# Patient Record
Sex: Male | Born: 1960 | Race: White
Health system: Southern US, Community
[De-identification: ages and names within clinical notes are randomized; demographics above are authoritative.]

## PROBLEM LIST (undated history)

## (undated) DIAGNOSIS — I10 Essential (primary) hypertension: Secondary | ICD-10-CM

## (undated) HISTORY — DX: Essential (primary) hypertension: I10

---

## 2013-06-29 ENCOUNTER — Ambulatory Visit: Payer: Self-pay | Admitting: Physician Assistant

## 2014-08-30 IMAGING — CR DG FOOT COMPLETE 3+V*L*
1 series · 3 of 3 positions shown · non-contrast
Comparison: none

REASON FOR EXAM: pain, swelling
COMMENTS:

[Series 1: ap · 0.17mm/px · 3 of 3 slices shown]
[im 1/3]
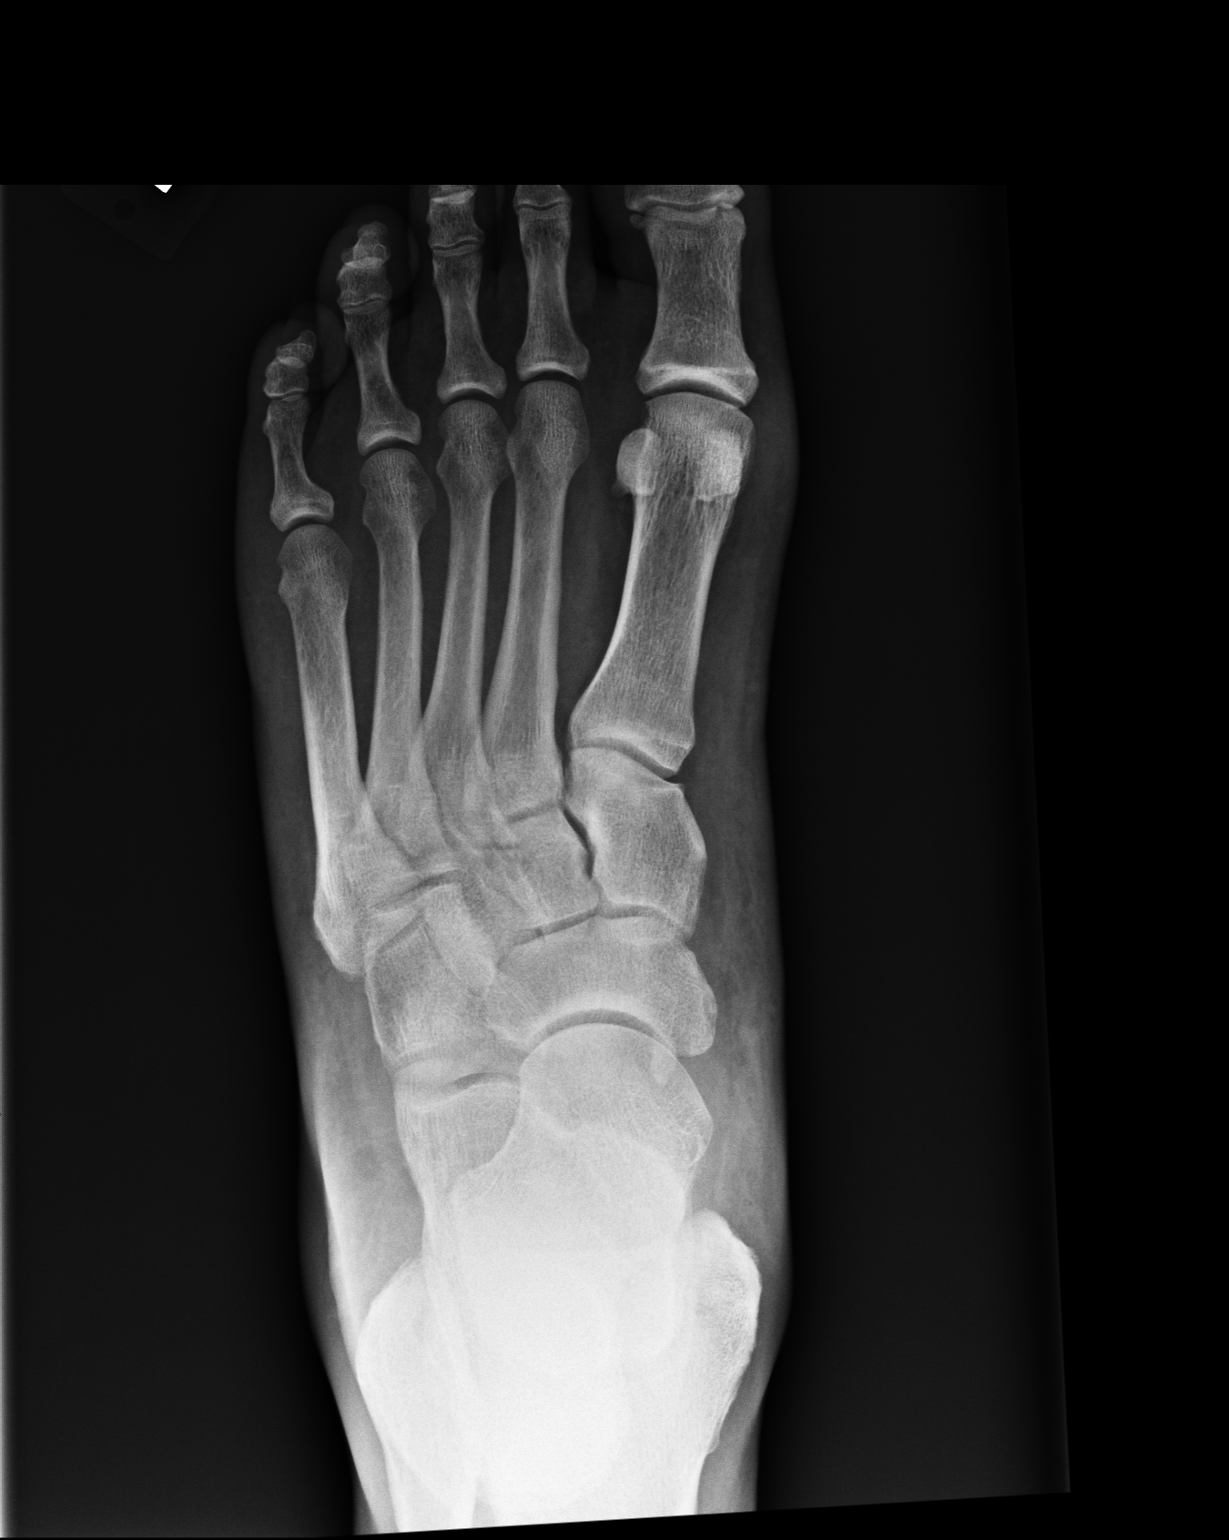
[im 2/3]
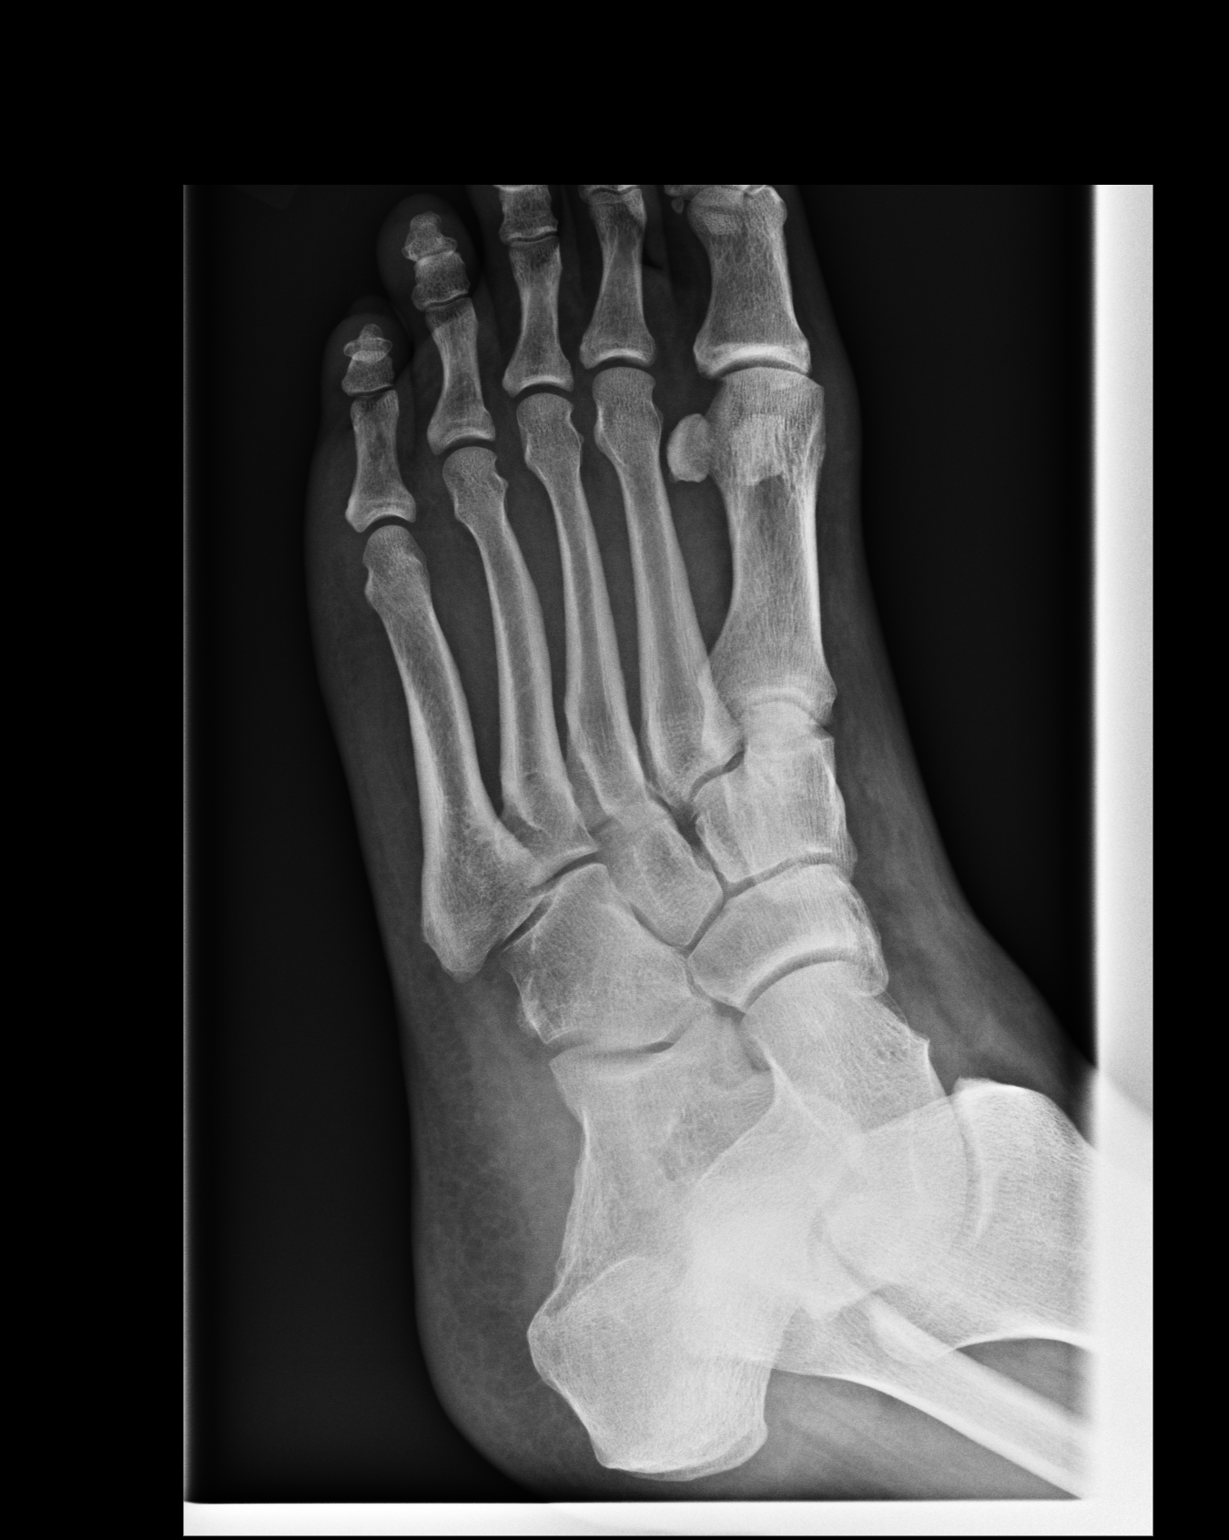
[im 3/3]
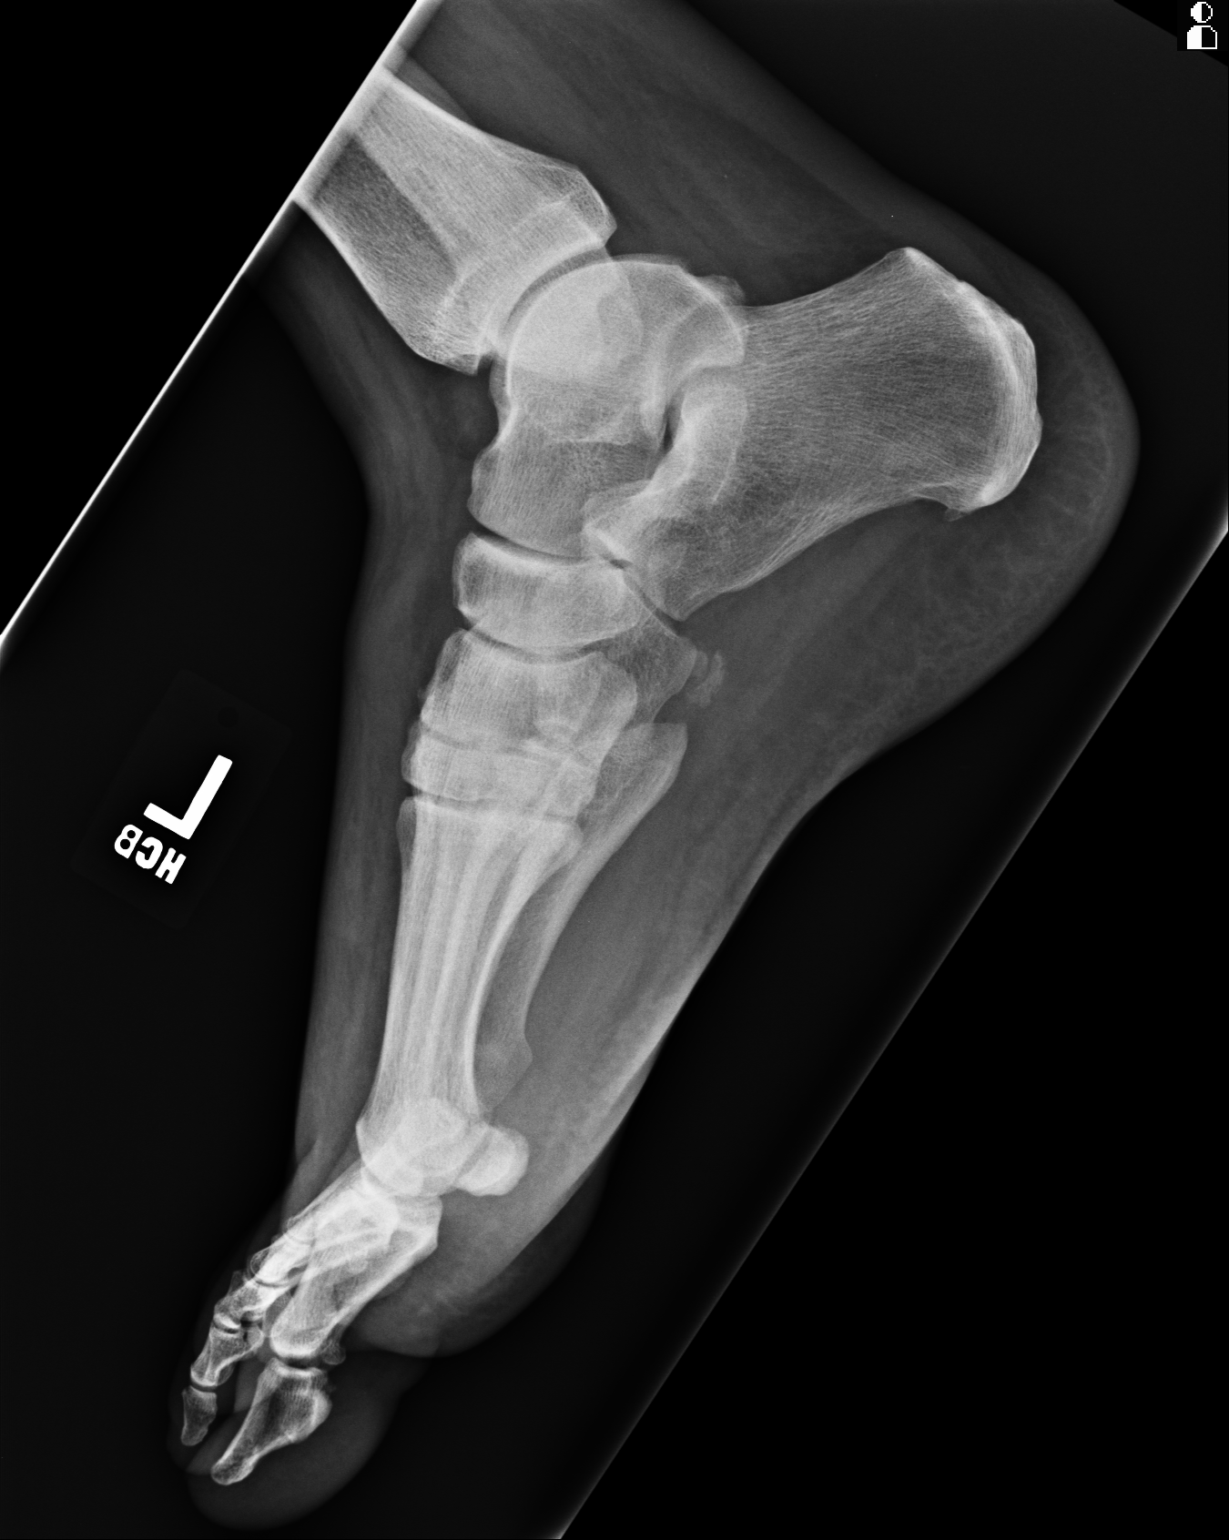

[3 of 3 positions shown; findings below may reference images not displayed]

PROCEDURE:     MDR - MDR FOOT LT COMP W/OBLQUES  - June 29, 2013  [DATE]

RESULT:     Three views of the left foot reveal the bones to be adequately
mineralized. There is no evidence of an acute fracture. There is bony
density adjacent to the tarsal cuboid on the lateral film that is likely
degenerative but could be posttraumatic in the appropriate clinical setting.
The metatarsal bases are intact.
IMPRESSION: There is no definite acute bony abnormality of the left
foot. There is mild soft tissue swelling over the dorsum of the midfoot.
Followup CT scanning is available if there are clinical concerns of the
fracture following any recent trauma.

[REDACTED]

## 2020-09-30 ENCOUNTER — Inpatient Hospital Stay
Admission: EM | Admit: 2020-09-30 | Discharge: 2020-10-02 | DRG: 885 | Disposition: A | Discharge status: Other Acute Inpatient Hospital | Payer: Self-pay | Attending: Internal Medicine | Admitting: Internal Medicine

## 2020-09-30 DIAGNOSIS — R45851 Suicidal ideations: Secondary | ICD-10-CM | POA: Diagnosis present

## 2020-09-30 DIAGNOSIS — E876 Hypokalemia: Secondary | ICD-10-CM | POA: Diagnosis present

## 2020-09-30 DIAGNOSIS — F10239 Alcohol dependence with withdrawal, unspecified: Secondary | ICD-10-CM | POA: Diagnosis present

## 2020-09-30 DIAGNOSIS — Z20822 Contact with and (suspected) exposure to covid-19: Secondary | ICD-10-CM | POA: Diagnosis present

## 2020-09-30 DIAGNOSIS — G250 Essential tremor: Secondary | ICD-10-CM | POA: Diagnosis present

## 2020-09-30 DIAGNOSIS — E872 Acidosis: Secondary | ICD-10-CM | POA: Diagnosis present

## 2020-09-30 DIAGNOSIS — N179 Acute kidney failure, unspecified: Secondary | ICD-10-CM | POA: Diagnosis present

## 2020-09-30 DIAGNOSIS — F10231 Alcohol dependence with withdrawal delirium: Secondary | ICD-10-CM | POA: Diagnosis present

## 2020-09-30 DIAGNOSIS — I10 Essential (primary) hypertension: Secondary | ICD-10-CM | POA: Diagnosis present

## 2020-09-30 DIAGNOSIS — Z008 Encounter for other general examination: Secondary | ICD-10-CM

## 2020-09-30 DIAGNOSIS — F23 Brief psychotic disorder: Principal | ICD-10-CM | POA: Diagnosis present

## 2020-09-30 DIAGNOSIS — F10939 Alcohol use, unspecified with withdrawal, unspecified: Secondary | ICD-10-CM | POA: Diagnosis present

## 2020-09-30 DIAGNOSIS — Y901 Blood alcohol level of 20-39 mg/100 ml: Secondary | ICD-10-CM | POA: Diagnosis present

## 2020-09-30 LAB — RAPID DRUG SCREEN, URINE
Barbiturate Screen, UR: NEGATIVE
Barbiturate Screen, UR: POSITIVE — AB
Benzodiazepine Screen, UR: NEGATIVE
Benzodiazepine Screen, UR: NEGATIVE
Cannabinoid Screen, UR: NEGATIVE
Cannabinoid Screen, UR: NEGATIVE
Cocaine, UR: NEGATIVE
Cocaine, UR: NEGATIVE
Opiate Screen, UR: NEGATIVE
Opiate Screen, UR: NEGATIVE
PCP Screen, UR: NEGATIVE
PCP Screen, UR: NEGATIVE
Urine Amphetamine Screen: NEGATIVE
Urine Amphetamine Screen: NEGATIVE

## 2020-09-30 LAB — URINALYSIS REFLEX TO MICROSCOPIC EXAM - REFLEX TO CULTURE
Bilirubin, UA: NEGATIVE
Glucose, UA: 50 — AB
Ketones UA: NEGATIVE
Leukocyte Esterase, UA: NEGATIVE
Nitrite, UA: NEGATIVE
Protein, UR: 30 — AB
Specific Gravity UA: 1.014 (ref 1.001–1.035)
Urine pH: 6 (ref 5.0–8.0)
Urobilinogen, UA: NORMAL mg/dL (ref 0.2–2.0)

## 2020-09-30 LAB — CBC AND DIFFERENTIAL
Absolute NRBC: 0 10*3/uL (ref 0.00–0.00)
Basophils Absolute Automated: 0.04 10*3/uL (ref 0.00–0.08)
Basophils Automated: 0.5 %
Eosinophils Absolute Automated: 0.01 10*3/uL (ref 0.00–0.44)
Eosinophils Automated: 0.1 %
Hematocrit: 41.2 % (ref 37.6–49.6)
Hgb: 14.1 g/dL (ref 12.5–17.1)
Immature Granulocytes Absolute: 0.04 10*3/uL (ref 0.00–0.07)
Immature Granulocytes: 0.5 %
Lymphocytes Absolute Automated: 0.81 10*3/uL (ref 0.42–3.22)
Lymphocytes Automated: 10.7 %
MCH: 33.7 pg — ABNORMAL HIGH (ref 25.1–33.5)
MCHC: 34.2 g/dL (ref 31.5–35.8)
MCV: 98.6 fL — ABNORMAL HIGH (ref 78.0–96.0)
MPV: 9.3 fL (ref 8.9–12.5)
Monocytes Absolute Automated: 1.17 10*3/uL — ABNORMAL HIGH (ref 0.21–0.85)
Monocytes: 15.4 %
Neutrophils Absolute: 5.53 10*3/uL (ref 1.10–6.33)
Neutrophils: 72.8 %
Nucleated RBC: 0 /100 WBC (ref 0.0–0.0)
Platelets: 246 10*3/uL (ref 142–346)
RBC: 4.18 10*6/uL — ABNORMAL LOW (ref 4.20–5.90)
RDW: 12 % (ref 11–15)
WBC: 7.6 10*3/uL (ref 3.10–9.50)

## 2020-09-30 LAB — BASIC METABOLIC PANEL
Anion Gap: 14 (ref 5.0–15.0)
BUN: 34 mg/dL — ABNORMAL HIGH (ref 9.0–28.0)
CO2: 17 mEq/L — ABNORMAL LOW (ref 22–29)
Calcium: 8 mg/dL — ABNORMAL LOW (ref 8.5–10.5)
Chloride: 105 mEq/L (ref 100–111)
Creatinine: 1.4 mg/dL — ABNORMAL HIGH (ref 0.7–1.3)
Glucose: 97 mg/dL (ref 70–100)
Potassium: 3.9 mEq/L (ref 3.5–5.1)
Sodium: 136 mEq/L (ref 136–145)

## 2020-09-30 LAB — ECG 12-LEAD
Atrial Rate: 82 {beats}/min
P Axis: 70 degrees
P-R Interval: 152 ms
Q-T Interval: 410 ms
Q-T Interval: 436 ms
QRS Duration: 74 ms
QRS Duration: 80 ms
QTC Calculation (Bezet): 509 ms
QTC Calculation (Bezet): 509 ms
R Axis: -13 degrees
R Axis: 0 degrees
T Axis: 53 degrees
T Axis: 69 degrees
Ventricular Rate: 93 {beats}/min
Ventricular Rate: 82 {beats}/min

## 2020-09-30 LAB — GFR
EGFR: 38.8
EGFR: 51.8

## 2020-09-30 LAB — MAGNESIUM: Magnesium: 1.6 mg/dL (ref 1.6–2.6)

## 2020-09-30 LAB — COMPREHENSIVE METABOLIC PANEL
ALT: 17 U/L (ref 0–55)
AST (SGOT): 34 U/L (ref 5–34)
Albumin/Globulin Ratio: 1.1 (ref 0.9–2.2)
Albumin: 3.8 g/dL (ref 3.5–5.0)
Alkaline Phosphatase: 76 U/L (ref 38–106)
Anion Gap: 21 — ABNORMAL HIGH (ref 5.0–15.0)
BUN: 37 mg/dL — ABNORMAL HIGH (ref 9.0–28.0)
Bilirubin, Total: 0.4 mg/dL (ref 0.2–1.2)
CO2: 16 mEq/L — ABNORMAL LOW (ref 22–29)
Calcium: 9.1 mg/dL (ref 8.5–10.5)
Chloride: 98 mEq/L — ABNORMAL LOW (ref 100–111)
Creatinine: 1.8 mg/dL — ABNORMAL HIGH (ref 0.7–1.3)
Globulin: 3.5 g/dL (ref 2.0–3.6)
Glucose: 119 mg/dL — ABNORMAL HIGH (ref 70–100)
Potassium: 3.1 mEq/L — ABNORMAL LOW (ref 3.5–5.1)
Protein, Total: 7.3 g/dL (ref 6.0–8.3)
Sodium: 135 mEq/L — ABNORMAL LOW (ref 136–145)

## 2020-09-30 LAB — ETHANOL: Alcohol: 36 mg/dL — ABNORMAL HIGH

## 2020-09-30 LAB — COVID-19 (SARS-COV-2): SARS CoV 2 Overall Result: NEGATIVE

## 2020-09-30 LAB — ACETAMINOPHEN LEVEL: Acetaminophen Level: <7 ug/mL — ABNORMAL LOW (ref 10–30)

## 2020-09-30 LAB — LACTIC ACID, PLASMA: Lactic Acid: 1.7 mmol/L (ref 0.2–2.0)

## 2020-09-30 LAB — SALICYLATE LEVEL: Salicylate Level: <5 mg/dL — ABNORMAL LOW (ref 15.0–30.0)

## 2020-09-30 LAB — TSH: TSH: 1.02 u[IU]/mL (ref 0.35–4.94)

## 2020-09-30 LAB — OSMOLALITY: Osmolality: 294 mosm/kg (ref 280–300)

## 2020-09-30 MED ORDER — LORAZEPAM 2 MG/ML IJ SOLN
1.0000 mg | INTRAMUSCULAR | Status: DC | PRN
Start: 2020-09-30 — End: 2020-10-01

## 2020-09-30 MED ORDER — LORAZEPAM 2 MG/ML IJ SOLN
1.0000 mg | Freq: Every evening | INTRAMUSCULAR | Status: DC | PRN
Start: 2020-09-30 — End: 2020-10-01
  Administered 2020-09-30: 21:00:00 1 mg via INTRAVENOUS
  Filled 2020-09-30: qty 1

## 2020-09-30 MED ORDER — POTASSIUM CHLORIDE CRYS ER 20 MEQ PO TBCR
20.0000 meq | EXTENDED_RELEASE_TABLET | Freq: Once | ORAL | Status: AC
Start: 2020-09-30 — End: 2020-09-30
  Administered 2020-09-30: 06:00:00 20 meq via ORAL
  Filled 2020-09-30: qty 1

## 2020-09-30 MED ORDER — CHLORDIAZEPOXIDE HCL 25 MG PO CAPS
25.0000 mg | ORAL_CAPSULE | Freq: Two times a day (BID) | ORAL | Status: DC
Start: 2020-10-02 — End: 2020-09-30

## 2020-09-30 MED ORDER — OLANZAPINE 5 MG PO TBDP
15.0000 mg | ORAL_TABLET | Freq: Once | ORAL | Status: AC
Start: 2020-09-30 — End: 2020-09-30
  Administered 2020-09-30: 05:00:00 15 mg via ORAL
  Filled 2020-09-30: qty 3

## 2020-09-30 MED ORDER — FOLIC ACID 1 MG PO TABS
1.0000 mg | ORAL_TABLET | Freq: Every day | ORAL | Status: DC
Start: 2020-09-30 — End: 2020-10-02
  Administered 2020-09-30 – 2020-10-02 (×3): 1 mg via ORAL
  Filled 2020-09-30 (×3): qty 1

## 2020-09-30 MED ORDER — GLUCOSE 40 % PO GEL
15.0000 g | ORAL | Status: DC | PRN
Start: 2020-09-30 — End: 2020-10-02

## 2020-09-30 MED ORDER — LIDOCAINE 5 % EX PTCH
1.0000 | MEDICATED_PATCH | CUTANEOUS | Status: DC | PRN
Start: 2020-09-30 — End: 2020-10-02
  Administered 2020-09-30: 22:00:00 1 via TRANSDERMAL
  Filled 2020-09-30: qty 1

## 2020-09-30 MED ORDER — PLASMA-LYTE A IV BOLUS
500.0000 mL | Freq: Once | INTRAVENOUS | Status: AC
Start: 2020-09-30 — End: 2020-09-30
  Administered 2020-09-30: 17:00:00 500 mL via INTRAVENOUS

## 2020-09-30 MED ORDER — DEXTROSE 50 % IV SOLN
12.5000 g | INTRAVENOUS | Status: DC | PRN
Start: 2020-09-30 — End: 2020-10-02

## 2020-09-30 MED ORDER — CHLORDIAZEPOXIDE HCL 25 MG PO CAPS
25.0000 mg | ORAL_CAPSULE | Freq: Two times a day (BID) | ORAL | Status: DC
Start: 2020-10-01 — End: 2020-10-02
  Administered 2020-10-01 – 2020-10-02 (×2): 25 mg via ORAL
  Filled 2020-09-30 (×3): qty 1

## 2020-09-30 MED ORDER — THIAMINE HCL 100 MG/ML IJ SOLN
500.0000 mg | Freq: Three times a day (TID) | INTRAVENOUS | Status: DC
Start: 2020-09-30 — End: 2020-10-02
  Administered 2020-09-30 – 2020-10-02 (×7): 500 mg via INTRAVENOUS
  Filled 2020-09-30 (×10): qty 5

## 2020-09-30 MED ORDER — TAB-A-VITE/BETA CAROTENE PO TABS
1.0000 | ORAL_TABLET | Freq: Every day | ORAL | Status: DC
Start: 2020-09-30 — End: 2020-10-02
  Administered 2020-09-30 – 2020-10-02 (×3): 1 via ORAL
  Filled 2020-09-30 (×3): qty 1

## 2020-09-30 MED ORDER — NALOXONE HCL 0.4 MG/ML IJ SOLN (WRAP)
0.2000 mg | INTRAMUSCULAR | Status: DC | PRN
Start: 2020-09-30 — End: 2020-10-02

## 2020-09-30 MED ORDER — MELATONIN 3 MG PO TABS
3.0000 mg | ORAL_TABLET | Freq: Every evening | ORAL | Status: DC | PRN
Start: 2020-09-30 — End: 2020-10-02
  Administered 2020-09-30: 21:00:00 3 mg via ORAL
  Filled 2020-09-30: qty 1

## 2020-09-30 MED ORDER — CHLORDIAZEPOXIDE HCL 25 MG PO CAPS
25.0000 mg | ORAL_CAPSULE | Freq: Two times a day (BID) | ORAL | Status: DC
Start: 2020-10-03 — End: 2020-10-02

## 2020-09-30 MED ORDER — CHLORDIAZEPOXIDE HCL 25 MG PO CAPS
25.0000 mg | ORAL_CAPSULE | Freq: Two times a day (BID) | ORAL | Status: DC
Start: 2020-10-01 — End: 2020-09-30

## 2020-09-30 MED ORDER — ENOXAPARIN SODIUM 40 MG/0.4ML SC SOLN
40.0000 mg | Freq: Every day | SUBCUTANEOUS | Status: DC
Start: 2020-09-30 — End: 2020-10-02
  Administered 2020-09-30 – 2020-10-02 (×2): 40 mg via SUBCUTANEOUS
  Filled 2020-09-30 (×3): qty 0.4

## 2020-09-30 MED ORDER — PHENOBARBITAL SODIUM 130 MG/ML IJ SOLN
130.0000 mg | INTRAMUSCULAR | Status: DC | PRN
Start: 2020-09-30 — End: 2020-09-30
  Administered 2020-09-30: 09:00:00 130 mg via INTRAVENOUS
  Filled 2020-09-30: qty 1

## 2020-09-30 MED ORDER — THIAMINE HCL 100 MG/ML IJ SOLN
100.0000 mg | Freq: Every day | INTRAVENOUS | Status: DC
Start: 2020-09-30 — End: 2020-09-30
  Filled 2020-09-30 (×2): qty 1

## 2020-09-30 MED ORDER — GLUCAGON 1 MG IJ SOLR (WRAP)
1.0000 mg | INTRAMUSCULAR | Status: DC | PRN
Start: 2020-09-30 — End: 2020-10-02

## 2020-09-30 MED ORDER — ACETAMINOPHEN 650 MG RE SUPP
650.0000 mg | Freq: Four times a day (QID) | RECTAL | Status: DC | PRN
Start: 2020-09-30 — End: 2020-10-02

## 2020-09-30 MED ORDER — SODIUM CHLORIDE 0.9 % IV BOLUS
1000.0000 mL | Freq: Once | INTRAVENOUS | Status: AC
Start: 2020-09-30 — End: 2020-09-30
  Administered 2020-09-30: 06:00:00 1000 mL via INTRAVENOUS

## 2020-09-30 MED ORDER — ACETAMINOPHEN 325 MG PO TABS
650.0000 mg | ORAL_TABLET | Freq: Four times a day (QID) | ORAL | Status: DC | PRN
Start: 2020-09-30 — End: 2020-10-02

## 2020-09-30 MED ORDER — CHLORDIAZEPOXIDE HCL 25 MG PO CAPS
25.0000 mg | ORAL_CAPSULE | Freq: Three times a day (TID) | ORAL | Status: DC
Start: 2020-09-30 — End: 2020-09-30
  Administered 2020-09-30: 17:00:00 25 mg via ORAL
  Filled 2020-09-30: qty 1

## 2020-09-30 NOTE — ED Notes (Signed)
1610: PT prescreen has been uploaded into PT chart under "media"

## 2020-09-30 NOTE — ED Notes (Signed)
Wt staffed pt with Dr. Darliss Ridgel at [facility] who reports that pt is not appropriate for psych given no psych hx and given pt's reported use of alcohol. Dr. Darliss Ridgel recommended medical admit with psych consult. Wt relayed this information to Dr. Sherryll Burger in the ER.

## 2020-09-30 NOTE — Plan of Care (Signed)
Admission/Transfer Medicine B  Notes:   Pt arrived to floor via wheelchair  from ED around 1520 .Admission vitals stable.  Admission orders in place. Denies pain at this time. Pt belongings ( shirt, pant, socks, shoes, belt and cellphone) in place. Oriented to unit, whiteboard updated. Pt updated on plan of care.     1:1 sitter at bedside for safety, Pt currently on TDO,security outside the pt room .    Neuro: Pt is A&Ox4, confusion at times. Follows commands, able to make his needs known.  GIGU:  Pt is continent of bowel and bladder. Abd soft, non distended and active BS. Had BM this shift.   Cardiac: No tele, denies any chest pain this shift.  Pulm: Regular respirations, bilateral clear lungs sounds. Pt sating 97% in roomair.  Skin: redness to the chest, scattered scars to LUE, LLE, dryness to Left foot, edema to BLE.  Pain: no complain of pain this shift.  Mobility: One person's assist with personal care.   Safety: Pt is a fall risk. Fall precautions in place: bed in lowest position, fall mat in place, bed alarm on, call light is within reach.    Active lines :   x1 piv, c/d/i          Problem: Compromised Tissue integrity  Goal: Damaged tissue is healing and protected  Outcome: Progressing  Goal: Nutritional status is improving  Outcome: Progressing     Problem: Moderate/High Fall Risk Score >5  Goal: Patient will remain free of falls  Outcome: Progressing  Flowsheets (Taken 09/30/2020 1530)  High (Greater than 13):   HIGH-Initiate use of floor mats as appropriate   HIGH-Apply yellow "Fall Risk" arm band   HIGH-Utilize chair pad alarm for patient while in the chair   HIGH-Bed alarm on at all times while patient in bed   HIGH-Visual cue at entrance to patient's room     Problem: Safety  Goal: Patient will be free from injury during hospitalization  Outcome: Progressing  Flowsheets (Taken 09/30/2020 1558)  Patient will be free from injury during hospitalization:   Assess patient's risk for falls and  implement fall prevention plan of care per policy   Provide and maintain safe environment   Hourly rounding   Ensure appropriate safety devices are available at the bedside   Use appropriate transfer methods     Problem: Pain  Goal: Pain at adequate level as identified by patient  Outcome: Progressing  Flowsheets (Taken 09/30/2020 1558)  Pain at adequate level as identified by patient:   Identify patient comfort function goal   Assess pain on admission, during daily assessment and/or before any "as needed" intervention(s)     Problem: Side Effects from Pain Analgesia  Goal: Patient will experience minimal side effects of analgesic therapy  Outcome: Progressing  Flowsheets (Taken 09/30/2020 1558)  Patient will experience minimal side effects of analgesic therapy:   Monitor/assess patient's respiratory status (RR depth, effort, breath sounds)   Assess for changes in cognitive function     Problem: Psychosocial and Spiritual Needs  Goal: Demonstrates ability to cope with hospitalization/illness  Outcome: Progressing  Flowsheets (Taken 09/30/2020 1558)  Demonstrates ability to cope with hospitalizations/illness:   Encourage verbalization of feelings/concerns/expectations   Provide quiet environment   Encourage patient to set small goals for self

## 2020-09-30 NOTE — ED Notes (Signed)
Patient appears to be resting comfortably. Patient NAD, breathing normal.

## 2020-09-30 NOTE — Plan of Care (Addendum)
Medicine Progress note:  1:1 sitter at bedside.    Pt refused morning librium stating he is not going through any withdrawals at the moment.    0630 - IV pulled out when pt ambulating to bathroom.     Neuro: Pt is A&Ox3, calm and co-operative, able to make needs known. Pt is still paranoid that someone is trying to hurt him.  Cardiac: Pt not on tele. No c/o chest pain.  Pulm: Regular respirations, no sob or desats noted, on RA.  GI/GU: Pt is continent of bowel and bladder. Abd soft, non distended and active BS. No BM for this shift, pt uses urinal to void.  Skin: Scattered scars to LUE and LLE.   Pain: pt c/o mild pain in the back of the neck. Lidocaine patch applied.  Mobility: x1 standby assist.  Safety: Purposeful rounding done. Falls precautions in place, bed in the lowest position, exit bed alarm on, fall mats X1 at  bedside, 3/4 side rails up, personal belongings and call light within reach.    Patient Lines/Drains/Airways Status       Active Lines, Drains and Airways       Name Placement date Placement time Site Days    Peripheral IV 09/30/20 18 G Anterior;Left Forearm 09/30/20  0615  Forearm  less than 1                     Intake/Output Summary (Last 24 hours) at 09/30/2020 2217  Last data filed at 09/30/2020 1800  Gross per 24 hour   Intake 250 ml   Output    Net 250 ml          Problem: Compromised Tissue integrity  Goal: Damaged tissue is healing and protected  Outcome: Progressing  Goal: Nutritional status is improving  Outcome: Progressing     Problem: Moderate/High Fall Risk Score >5  Goal: Patient will remain free of falls  Outcome: Progressing     Problem: Safety  Goal: Patient will be free from injury during hospitalization  Outcome: Progressing     Problem: Pain  Goal: Pain at adequate level as identified by patient  Outcome: Progressing     Problem: Side Effects from Pain Analgesia  Goal: Patient will experience minimal side effects of analgesic therapy  Outcome: Progressing     Problem:  Psychosocial and Spiritual Needs  Goal: Demonstrates ability to cope with hospitalization/illness  Outcome: Progressing

## 2020-09-30 NOTE — H&P (Signed)
ADMISSION HISTORY AND PHYSICAL EXAM    Date Time: 09/30/20 1:27 PM  Patient Name: Kristopher Rogers  Attending Physician: Elizbeth Squires, MD  Primary Care Physician: No primary care provider on file.    CC: alcohol withdrawal, suicidal ideation      Assessment:   Kristopher Rogers is a 59 year old male with a pmh alcohol abuse, anxiety, gout, tremors, htn who presents to the hospital with paranoia, SI and tremors likely 2/2 to alcohol withdrawal vs underlying mental health disorder. Course complicated by electrolyte derangements and an AKI. Currently with ongoing delusions but calm after phenobarbital injection.     Plan:   #delusions 2/2 alcohol withdrawal vs mental health disorder  Patient says that he has been experiencing people following him for years and this has worsened over the past couple weeks. When I spoke with his daughter she mentioned he had been drinking for 15-20 years. Positive blood alcohol level.   - thiamine daily 3 doses, folic acid   -librium taper  - PRN ativan Q1 hour per CIWA score   -psych consult: they believe some of his symptoms (mania, unable to sleep, grandiose thoughts) my be related to initiation of zoloft, recommend discontinuing zoloft, also recommended thiamine 500mg    -sitter ordered, suicide precautions initiated   -ativan 1mg  qhs for sleep     #AKI  Patient denies nausea or vomiting but admits to some diarrhea this week. Says he has had poor oral intake.   -likely prerenal, resolving with IV fluids    -500 mL bolus plasmalyte, will reassess fluid needs if patient is unable to tolerate oral intake     #AGMA  Likely 2/2 alcohol use   -resolving    #hypocalcemia  corrected with albumin to 8.2   -monitor now that patient is able to eat and drink with resolving AKI    #htn  - hold losartan in setting of AKI   - amlodipine/ hydral prn if needed     #anxiety  - discontinue zoloft, as it may be contributing to symptoms per psych    #prolonged QTC  -repeat EKG in am  -avoid qtc prolonging  medications    #hx gout  Swelling present in left lower extremity, patient says he had a gout flare last week that has since resolved  -ctm, colchicine if symptoms return    Code: full  Diet: regular   dvt ppx: lovenox     Disposition: (Please see PAF column for Expected D/C Date)   Today's date: 09/30/2020   Admit Date: 09/30/2020  4:15 AM  Service status: Observation  Clinical Milestones: tbd  Anticipated discharge needs: psych follow up     History of Presenting Illness:   Kristopher Rogers is a 59 y.o. male who presents to the hospital with hallucinations, paranoia and tremors. Patient is a heavy drinker and last drink was 48 hours ago. Patient was brough by police after 911 was called. Patient believes he is hiding from [facility].   Denies current SI or HI. But says its [employer] found him again he would rather be killed than go with them.     Patient says he was at the holiday in and [employer] killed people and they were looking for him. Believes his fiance, Efraim Kaufmann, was murdered yesterday.     Patient notes that he has had people following him for years.    Patient has not been eating but has been drinking water when he can.  He has had problems sleeping for several  weeks, sometimes going several days without sleep.    Denies kidney problems. When he was not eating he had some diarrhea.   Patient says he had fevers 2 weeks ago but none since then.   Patient is inconsistent about his last dirnk of alcohol. Sometimes citing several days ago to yesterday.   Usually drinks vodka, 2-3 drinks a day. For 15-20 years.   Denies history of depression.       Spoke with his daughter on the phone today around 230pm who said she does not see her father often and he has been "deteriorating" for several years from alcohol abuse. She was unsure how much he had been drinking but believes it is a large amount. Says he has been drinking for 15-20 years. Says his mother had undiagnosed mental health condition they believed was bipolar disorder and  she committed suicide. She does not believe he has a girlfriend named [place] and does not think anyone has been living with him at the hotel.     Past Medical History:     Past Medical History:   Diagnosis Date    Hypertension        Available old records reviewed, including:  [facility] past medical notes     Past Surgical History:   History reviewed. No pertinent surgical history.    Family History:   Mother with bipolar disorder, mother committed suicide     Social History:     Social History     Tobacco Use   Smoking Status Never Smoker   Smokeless Tobacco Never Used     Social History     Substance and Sexual Activity   Alcohol Use Yes    Comment: patient states he has "a few mixed drinks after work" daily      Social History     Substance and Sexual Activity   Drug Use Never       Allergies:     Allergies   Allergen Reactions    Penicillins Rash       Medications:     Home Medications             losartan (COZAAR) 50 MG tablet     Take by mouth daily            Method by which medications were confirmed on admission: verbally    Review of Systems:   All other systems were reviewed and are negative except: See HPI    Physical Exam:     Patient Vitals for the past 24 hrs:   BP Temp Temp src Pulse Resp SpO2 Height Weight   09/30/20 1100 144/87   (!) 57  98 %     09/30/20 0921 (!) 175/97   81 18 98 %     09/30/20 0349 (!) 126/93 98 F (36.7 C) Skin (!) 118 18 96 % 1.803 m (5\' 11" ) 86.2 kg (190 lb)     Body mass index is 26.5 kg/m.  No intake or output data in the 24 hours ending 09/30/20 1327    Gen: AAOx3, appears comfortable  Cardiac: no JVD, Regular rate and rhythm, Normal S1 S2  HEENT: EOMI. No scleral icterus  Lungs: Clear to auscultation bilaterally. No increased WOB. No wheezes, rhonchi, rales.  Abdomen: Abdomen soft, nontender, nondistended  Ext: left lower extremity with 1+ pitting edema, without tenderness or redniess  Skin: No rashes noted, warm and dry  Neuro: No obvious focal neurologic  deficits, moves all extremities,  resting tremor in place, resolves with purposeful movement         Labs:     Results     Procedure Component Value Units Date/Time    Osmolality [[id number]] Collected: 09/30/20 0852    Specimen: Blood Updated: 09/30/20 1000     Osmolality 294 mosm/kg     Basic Metabolic Panel [[id number]]  (Abnormal) Collected: 09/30/20 0852    Specimen: Blood Updated: 09/30/20 0937     Glucose 97 mg/dL      BUN 44.0 mg/dL      Creatinine 1.4 mg/dL      Calcium 8.0 mg/dL      Sodium 102 mEq/L      Potassium 3.9 mEq/L      Chloride 105 mEq/L      CO2 17 mEq/L      Anion Gap 14.0    GFR [[id number]] Collected: 09/30/20 0852     Updated: 09/30/20 0937     EGFR 51.8    Lactic Acid [[id number]] Collected: 09/30/20 0617    Specimen: Blood Updated: 09/30/20 0626     Lactic Acid 1.7 mmol/L     Rapid drug screen, urine [[id number]] Collected: 09/30/20 0551    Specimen: Urine Updated: 09/30/20 9518     Urine Amphetamine Screen Negative     Barbiturate Screen, UR Negative     Benzodiazepine Screen, UR Negative     Cannabinoid Screen, UR Negative     Cocaine, UR Negative     Opiate Screen, UR Negative     PCP Screen, UR Negative    UA Reflex to Micro - Reflex to Culture [[id number]]  (Abnormal) Collected: 09/30/20 0551    Specimen: Urine, Clean Catch Updated: 09/30/20 1601     Urine Type Urine, Clean Ca     Color, UA Yellow     Clarity, UA Clear     Specific Gravity UA 1.014     Urine pH 6.0     Leukocyte Esterase, UA Negative     Nitrite, UA Negative     Protein, UR 30     Glucose, UA 50     Ketones UA Negative     Urobilinogen, UA Normal mg/dL      Bilirubin, UA Negative     Blood, UA Moderate     RBC, UA 0 - 2 /hpf      WBC, UA 0 - 5 /hpf      Squamous Epithelial Cells, Urine 0 - 5 /hpf      Hyaline Casts, UA 11 - 25 /lpf     TSH [[id number]] Collected: 09/30/20 0503    Specimen: Blood Updated: 09/30/20 0555     TSH 1.02 uIU/mL     Salicylate Level [[id number]]  (Abnormal) Collected: 09/30/20 0503    Specimen: Blood  Updated: 09/30/20 0541     Salicylate Level <5.0 mg/dL     Magnesium [[id number]] Collected: 09/30/20 0503    Specimen: Blood Updated: 09/30/20 0541     Magnesium 1.6 mg/dL     GFR [[id number]] Collected: 09/30/20 0503     Updated: 09/30/20 0541     EGFR 38.8    Comprehensive metabolic panel [[id number]]  (Abnormal) Collected: 09/30/20 0503    Specimen: Blood Updated: 09/30/20 0541     Glucose 119 mg/dL      BUN 54.6 mg/dL      Creatinine 1.8 mg/dL      Sodium 270 mEq/L      Potassium 3.1 mEq/L  Chloride 98 mEq/L      CO2 16 mEq/L      Calcium 9.1 mg/dL      Protein, Total 7.3 g/dL      Albumin 3.8 g/dL      AST (SGOT) 34 U/L      ALT 17 U/L      Alkaline Phosphatase 76 U/L      Bilirubin, Total 0.4 mg/dL      Globulin 3.5 g/dL      Albumin/Globulin Ratio 1.1     Anion Gap 21.0    Ethanol (Alcohol) Level [[id number]]  (Abnormal) Collected: 09/30/20 0503    Specimen: Blood Updated: 09/30/20 0541     Alcohol 36 mg/dL     Acetaminophen Level [[id number]]  (Abnormal) Collected: 09/30/20 0503    Specimen: Blood Updated: 09/30/20 0541     Acetaminophen Level <7 ug/mL     CBC and differential [[id number]]  (Abnormal) Collected: 09/30/20 0503    Specimen: Blood Updated: 09/30/20 0533     WBC 7.60 x10 3/uL      Hgb 14.1 g/dL      Hematocrit 44.0 %      Platelets 246 x10 3/uL      RBC 4.18 x10 6/uL      MCV 98.6 fL      MCH 33.7 pg      MCHC 34.2 g/dL      RDW 12 %      MPV 9.3 fL      Neutrophils 72.8 %      Lymphocytes Automated 10.7 %      Monocytes 15.4 %      Eosinophils Automated 0.1 %      Basophils Automated 0.5 %      Immature Granulocytes 0.5 %      Nucleated RBC 0.0 /100 WBC      Neutrophils Absolute 5.53 x10 3/uL      Lymphocytes Absolute Automated 0.81 x10 3/uL      Monocytes Absolute Automated 1.17 x10 3/uL      Eosinophils Absolute Automated 0.01 x10 3/uL      Basophils Absolute Automated 0.04 x10 3/uL      Immature Granulocytes Absolute 0.04 x10 3/uL      Absolute NRBC 0.00 x10 3/uL     COVID-19 (SARS-COV-2) (ID  Now)- Behavioral health admission (no isolation) [[id number]] Collected: 09/30/20 0503    Specimen: Nasopharyngeal Swab from Nasopharynx Updated: 09/30/20 0532     Purpose of COVID testing Screening     SARS-CoV-2 Specimen Source Nasopharyngeal     SARS CoV 2 Overall Result Negative    Narrative:      o Collect and clearly label specimen type:  o Upper respiratory specimen: One Nasopharyngeal Dry Swab NO  Transport Media.  o Hand deliver to laboratory ASAP  Indication for testing->Behavioral health admission  Screening          Imaging personally reviewed, including: No results found.    Safety Checklist  DVT prophylaxis:  CHEST guideline (See page [id number]) Chemical   Foley:  [place] Rn Foley protocol Not present   IVs:  Peripheral IV   PT/OT: Ordered   Daily CBC & or Chem ordered:  SHM/ABIM guidelines (see #5) Yes, due to clinical and lab instability   Reference for approximate charges of common labs: CBC auto diff - $76   BMP - $99   Mg - $79    Signed by: Joycelyn Das, DO,   cc:No primary care provider on  file.

## 2020-09-30 NOTE — ED Notes (Signed)
[facility] EMERGENCY DEPT  ED NURSING NOTE FOR THE RECEIVING INPATIENT NURSE   ED NURSE Vernell Leep [phone]   ED CHARGE RN Marisue Ivan   ADMISSION INFORMATION   Kristopher Rogers is a 59 y.o. male admitted with an ED diagnosis of:    1. Alcohol withdrawal syndrome with complication         Isolation: None   Allergies: Penicillins   Holding Orders confirmed? No   Belongings Documented? N/A   Home medications sent to pharmacy confirmed? N/A   NURSING CARE   Patient Comes From:   Mental Status: Home Independent  alert and oriented   ADL: Independent with all ADLs   Ambulation: no difficulty   Pertinent Information  and Safety Concerns: Medical admit due to alcohol abuse and no psych history. Patient has been calm and cooperative during my shift      CT / NIH   CT Head ordered on this patient?  N/A   NIH/Dysphagia assessment done prior to admission? N/A   VITAL SIGNS (at the time of this note)      Vitals:    09/30/20 1330   BP: 159/79   Pulse: (!) 52   Resp: 18   Temp:    SpO2: 99%

## 2020-09-30 NOTE — ED Provider Notes (Signed)
[facility] EMERGENCY DEPARTMENT  ATTENDING PHYSICIAN HISTORY AND PHYSICAL EXAM     Patient Name: Kristopher Rogers  Encounter Date:  09/30/2020  Attending Physician: Tomasa Hose, MD  Room:  [id number]  Patient DOB:  1961/05/02  Age: 59 y.o. male  MRN:  [id number]  PCP: No primary care provider on file.         Diagnosis/Disposition:     Final Impression  Final diagnoses:   Alcohol withdrawal syndrome with complication     Disposition  ED Disposition     ED Disposition Condition Date/Time Comment    Admit  Fri Sep 30, 2020  1:26 PM Admitting Physician: Elizbeth Squires [[id number]]   Service:: Medicine [[id number]]   Estimated Length of Stay: > or = to 2 midnights   Tentative Discharge Plan?: Home or Self Care [1]   Does patient need telemetry?: Yes   Telemetry type (separate Telemetry order is also required):: Medical telemetry          Follow up  No follow-up provider specified.  Prescriptions  Current Discharge Medication List              MDM:      Initial Differential Diagnosis:   Initial differential diagnosis to include but not limited to: substance abuse disorder, schizophrenia, bipolar disorder, hallucinations (auditory or visual), anxiety disorder      Plan:    59 year old male with unknown past psychiatric history presented to the ER today with presumably paranoid delusions that [employer] is out to kill him. Patient denies any substance use. He is visibly anxious, will give 15 mg of olanzapine. Currently under police custody, ECO in place. Patient has been evaluated by [facility] with plans for TDO. Medical screening lab work. Initial EKG showed atrial fibrillation however had a poor baseline. Patient's tremor improved after Zyprexa, repeat EKG shows sinus rhythm with frequent PACs.      ED Course as of 10/02/20 0438   Fri Sep 30, 2020   0553 K3.1, will give 20 mEq of p.o. potassium. Creatinine of 1.8, unclear baseline. [KT]   0554 Anion gap acidosis with bicarb of 16, anion gap of 21. Patient does endorse  daily alcohol use, possibly alcoholic ketoacidosis. Will give 1 L IV fluid bolus, check lactate, serum osmolality and recheck BMP. [KT]   [id number] Call from [facility], pt with heavy ETOH abuse, concern for Dts, will reeval [AS]      ED Course User Index  [AS] Randol Kern, MD  [KT] Jobe Igo, MD             The patient was handed off to the next ED provider at the completion of my shift. All aspects of the work-up that had been completed, any pending studies/therapies, and all clinical decision making were discussed as appropriate at the time care was handed off.         The patient's past medical records, vital signs, nursing notes, past medical history, past surgical history, and social history including those in Care Everywhere when necessary, were reviewed by me.         History of Presenting Illness:     Nursing Triage note:      Chief complaint: Psychiatric Evaluation    The history is provided by the patient, the police and medical records.     [name] is a 59 y.o. male presenting as an ECO pending a TDO for hallucinations.Reporting that [employer] is out to get him, saying that he is "the one  that they want". Accompanying PD reports that patient asked PD to shoot him earlier today. Denies regular medication use. Hx nervous system tremors per review of prior chart from OSH, patient's address is in [place].     Denies present SI or HI in exam. Additionally denies present somatic complaints.         Review of Systems:  Physical Exam:     Review of Systems   Neurological: Positive for tremors.   Psychiatric/Behavioral: Positive for hallucinations. Negative for self-injury. The patient is nervous/anxious.          All other ROS reviewed and negative unless stated above in HPI    Pulse (!) 118   BP (!) 126/93   Resp 18   SpO2 96 %   Temp 98 F (36.7 C)     Physical Exam  Vitals and nursing note reviewed.   Constitutional:       General: He is not in acute distress.     Appearance: He is well-developed.       Comments: Anxious appearing, tremulous   HENT:      Head: Normocephalic and atraumatic.      Nose: Nose normal.   Eyes:      General: No scleral icterus.     Conjunctiva/sclera: Conjunctivae normal.   Neck:      Trachea: No tracheal deviation.   Cardiovascular:      Rate and Rhythm: Normal rate and regular rhythm.      Heart sounds: Normal heart sounds. No murmur heard.  No friction rub. No gallop.    Pulmonary:      Effort: Pulmonary effort is normal. No respiratory distress.      Breath sounds: Normal breath sounds. No wheezing or rales.   Abdominal:      General: Bowel sounds are normal. There is no distension.      Palpations: Abdomen is soft.      Tenderness: There is no abdominal tenderness. There is no rebound.   Musculoskeletal:         General: No tenderness or deformity. Normal range of motion.      Cervical back: Normal range of motion and neck supple.   Skin:     General: Skin is warm and dry.      Findings: No rash.   Neurological:      Mental Status: He is alert and oriented to person, place, and time.              Diagnostic Results:     Laboratory Studies:    All lab values have been personally reviewed by me    Results     Procedure Component Value Units Date/Time    Magnesium [[id number]] Collected: 10/02/20 0421    Specimen: Blood Updated: 10/02/20 0421    Narrative:      Starting For 1 Occurences  QAMLAB X 3    Vitamin B12 [[id number]] Collected: 10/02/20 0421    Specimen: Blood Updated: 10/02/20 0421    Narrative:      Starting For 1 Occurences  QAMLAB X 3    Folate [[id number]] Collected: 10/02/20 0421    Specimen: Blood Updated: 10/02/20 0421    Narrative:      Starting For 1 Occurences  QAMLAB X 3    Hepatic function panel (LFT) [[id number]] Collected: 10/02/20 0421    Specimen: Blood Updated: 10/02/20 0421    Narrative:      Starting For 1 Occurences  QAMLAB X 3  Basic Metabolic Panel [[id number]] Collected: 10/02/20 0421    Specimen: Blood Updated: 10/02/20 0421    Narrative:      Starting For  1 Occurences  QAMLAB X 3    Phosphorus [[id number]] Collected: 10/02/20 0421    Specimen: Blood Updated: 10/02/20 0421    Narrative:      Starting For 1 Occurences  QAMLAB X 3    CBC and differential [[id number]] Collected: 10/02/20 0421    Specimen: Blood Updated: 10/02/20 0421    Narrative:      Starting For 1 Occurences  QAMLAB X 3    Syphilis Screen IgG and IgM [[id number]] Collected: 10/02/20 0421     Updated: 10/02/20 0421    Narrative:      Starting For 1 Occurences  QAMLAB X 3          Radiology Studies:    All images have been personally viewed by me    No orders to display           Interpretations, Clinical Decision Tools and Critical Care:     O2 Sat-           saturation: 96 %; Oxygen use: room air; Interpretation: Normal      Initial EKG, interpreted myself: Irregularly irregular rhythm, poor baseline with difficult to discern P waves, normal axis, no evidence of ischemia, rate 93 bpm    Repeat EKG: Sinus rhythm, normal axis, rate 82 bpm, frequent PACs, no evidence of ischemia, prolonged QTC at 509 ms           Procedures:   Procedures        Orders Placed During This Visit:     Encounter Orders:  Orders Placed This Encounter   Procedures    COVID-19 (SARS-COV-2) (ID Now)- Behavioral health admission (no isolation)    CBC and differential    Comprehensive metabolic panel    Rapid drug screen, urine    Ethanol (Alcohol) Level    TSH    Acetaminophen Level    Salicylate Level    Magnesium    UA Reflex to Micro - Reflex to Culture    GFR    Lactic Acid    Osmolality    Basic Metabolic Panel    GFR    Basic Metabolic Panel    Phosphorus    Magnesium    Rapid drug screen, urine (If not already done)    CBC and differential    GFR    Vitamin B12    Syphilis Screen IgG and IgM    Folate    Hepatic function panel (LFT)    GFR    Hemolysis index    Diet regular    Vital signs    Nursing communication    Telemetry ED Bridging Order    Pulse Oximetry    Notify physician (specify)     CIWA LORazepam Nursing Communication    Place sequential compression device    Maintain sequential compression device    Education: Alcohol Intake    Vital signs    Pulse Oximetry    Progressive Mobility Protocol    Notify physician    NSG Communication: Glucose POCT order (PRN hypoglycemia)    I/O    Height    Weight    Skin assessment    Nursing communication: Adult Hypoglycemia Treatment Algorithm    Education: Activity    Education: Disease Process & Condition    Education: Pain Management    Education: Falls Risk  Education: Smoking Cessation    Suicide: Radio producer Request    Full Code    Cats Consult    Adult Psychiatry Inpatient  Consult    OT eval and treat    PT evaluate and treat    ECG 12 lead (Electrocardiogram)    ECG 12 Lead    ECG 12 lead    ECG 12 lead    Saline lock IV    Saline lock IV    Adult Admit to Inpatient ([facility] Only)    Seizure precautions    Fall precautions    Aspiration precautions    Suicide precautions    Change Patient Name    Safety Tray Frequency: All meals       Encounter Medications:  Medications   folic acid (FOLVITE) tablet 1 mg (1 mg Oral Given 10/01/20 1000)   multivitamin tablet 1 tablet (1 tablet Oral Given 10/01/20 1000)   melatonin tablet 3 mg (3 mg Oral Given 09/30/20 2116)   dextrose (GLUCOSE) 40 % oral gel 15 g of glucose (has no administration in time range)     And   dextrose 50 % bolus 12.5 g (has no administration in time range)     And   glucagon (rDNA) (GLUCAGEN) injection 1 mg (has no administration in time range)   naloxone (NARCAN) injection 0.2 mg (has no administration in time range)   acetaminophen (TYLENOL) tablet 650 mg (has no administration in time range)     Or   acetaminophen (TYLENOL) suppository 650 mg (has no administration in time range)   enoxaparin (LOVENOX) syringe 40 mg (40 mg Subcutaneous Not Given 10/01/20 0903)   thiamine (B-1) 500 mg in sodium chloride 0.9 % 100 mL IVPB (500 mg Intravenous New Bag  10/01/20 2117)   chlordiazePOXIDE (LIBRIUM) capsule 25 mg (25 mg Oral Not Given 10/02/20 0401)     Followed by   chlordiazePOXIDE (LIBRIUM) capsule 25 mg (has no administration in time range)   lidocaine (LIDODERM) 5 % 1 patch (1 patch Transdermal Patch Removed 10/01/20 0904)   amLODIPine (NORVASC) tablet 5 mg (5 mg Oral Given 10/01/20 1115)   hydrALAZINE (APRESOLINE) injection 10 mg (has no administration in time range)   propranolol (INDERAL) tablet 20 mg (20 mg Oral Given 10/01/20 2204)   magnesium sulfate 1g in dextrose 5% IVPB (premix) (1 g Intravenous Not Given 10/01/20 1555)   LORazepam (ATIVAN) tablet 1 mg (has no administration in time range)   magnesium oxide (MAG-OX) tablet 400 mg (400 mg Oral Given 10/01/20 1742)   LORazepam (ATIVAN) tablet 2 mg (2 mg Oral Not Given 10/01/20 2206)   OLANZapine zydis (ZyPREXA ZYDIS) disintegrating tablet 15 mg (15 mg Oral Given 09/30/20 0453)   potassium chloride (KLOR-CON) CR tablet 20 mEq (20 mEq Oral Given 09/30/20 0612)   sodium chloride 0.9 % bolus 1,000 mL (1,000 mLs Intravenous New Bag 09/30/20 0616)   Plasma-Lyte A IV bolus 500 mL (500 mLs Intravenous New Bag 09/30/20 1636)   potassium chloride (KLOR-CON) packet 40 mEq (40 mEq Oral Given 10/01/20 1422)           Allergies & Medications:     Allergies:  Heis allergic to penicillins.    Home Medications     Med List Status: Complete Set By: Luvenia Starch, RN at 09/30/2020  3:38 PM                losartan (COZAAR) 50 MG tablet     Take  by mouth daily     sertraline (ZOLOFT) 100 MG tablet     Take 100 mg by mouth daily               Past History:     Medical:   Past Medical History:   Diagnosis Date    Hypertension        Surgical: He has no past surgical history on file.    Family: History reviewed. No pertinent family history.    Social: He reports that he has never smoked. He has never used smokeless tobacco. He reports current alcohol use. He reports that he does not use drugs.        ATTESTATIONS     Tomasa Hose,  MD    Scribe Attestation:    I am the first provider for this patient and I personally performed the services documented. Corrina Kelliher is scribing for me on this chart. This note and the patient instructions accurately reflect work and decisions made by me.      Documentation Notes:    Parts of this note were generated by the Epic EMR system/ Dragon speech recognition and may contain inherent errors or omissions not intended by the user. Grammatical errors, random word insertions, deletions, pronoun errors and incomplete sentences are occasional consequences of this technology due to software limitations. Not all errors are caught or corrected.    My documentation is often completed after the patient is no longer under my clinical care. In some cases, the Epic EMR may pull updated results into the above documentation which may not reflect all results or information that was available to me at the time of my medical decision making.     If there are questions or concerns about the content of this note or information contained within the body of this dictation they should be addressed directly with the author for clarification.

## 2020-09-30 NOTE — ED Triage Notes (Addendum)
Patient under [facility]CO in police custody. Patient went to a [facility] and was stating that the "ms13 is after me they are going to kill me." Patient reports consuming alcohol daily, but has not had a drink for 3 days. Patient appears anxious at this time. Reports the police are infiltrated with ms13 gang members. 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 Per Arna Medici from [facility] patient was having auditory and visual hallucinations.

## 2020-09-30 NOTE — Consults (Signed)
PSYCHIATRIC INITIAL CONSULTATION NOTE    Patient name:  Kristopher Rogers, Kristopher Rogers  Date of birth:  09/16/1961  Age:  59 y.o.  MRN:  [id number]  CSN:  [id number]  Date of admission:  09/30/2020  Date of consultation:  09/30/2020  Attending/Referring Physician:  Elizbeth Squires, MD  Consulting Attending Physician:  Armond Hang, MD  ==================================================    REASON FOR ADMISSION:  Alcohol withdrawal syndrome with complication [F10.239]  REASON FOR CONSULTATION:  Patient on TDO; possible SI    HISTORY OF PRESENT ILLNESS:  Sources of Information: patient, chart and collateral    HPI: Kristopher Rogers is a 59 y.o. male with a PMH of HTN, gout, tremors and alcohol use disorder with otherwise no psychiatric history  who presented 09/30/2020 for complicated alcohol withdrawal and currently on TDO. Psychiatry is consulted for TDO/possible SI.     Patient was brought to the [facility] by police for a TDO evaluation after they received a call that the patient went into a [facility] and threatened to kill himself with a pair of scissors.  He also reportedly threatened to buy over-the-counter pills to kill himself.  He stated that the "[employer] gang" was trying to kill him and when police arrived he begged them to shoot him.  He also stated that wherever he goes that "[employer] 13" is trying to kill him and anyone who gets in his way.  He reportedly had no ID on him and told police that he had hidden it in his wallet in the hotel room he has been staying at in the [place] holiday express and.  He appeared highly paranoid and was shaking with a tremor.  Per [facility] TDO screen "he was extremely paranoid when brought in inquiring about how much security we had in the building and insisting to be put in a room that could be locked from inside.  He did not feel having one officer was enough.  Several times he shouted out to someone who he said he could hear in the next room whom he believed came to rescue him.  When  explained that there was no one in the room and he refused to believe [place].  He had a difficult time answering questions based on reality such as where his wallet was, how long he had been in [place], who were his natural supports, etc. due to his extreme paranoia that caused him to continuously look around room into cower the couch." Per prescreen, "the patient said he worked for a company remotely and that he traveled a lot for his job.  He also stated he had been staying in the [facility] holiday and express for 4 years working remotely.  He would not disclose the name of the company he works for.  He said that he got [place] insurance through his ex-wife from a [employer] fast streamer.  However he followed that by saying that his ex-wife had just been murdered while he was on the phone with her 3 hours ago and that he was going to be next."  Additionally, "patient stated that he had made statements about suicide because he would rather die by his own hand then be tortured by the gang members.  Other than that he denied any prior mental illnesses.  He denied previous suicide attempts.  He also denied any psychiatric hospitalizations.  He takes no psychoactive medication.  Only losartan for high blood pressure.  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  He denies withdrawal from alcohol.  He says however that he drinks 3 drinks of vodka every night.  The last 3 nights he has not had anything to drink.  He denies ever been to detox or rehab." The report went on to say, " problems were further increased because the police had looked up the wrong person in their system and gave [place] incorrect information about his birthdate and other facts."    The TDO was upheld and patient's hearing is scheduled for 10/03/2020.  In the ED, labs were significant for multiple metabolic derangements with Cr 1.8 and AG 21 21.  Urine drug screen was negative.  Blood alcohol level at 5:03 AM was 36.  Patient was  hypertensive and initially tachycardic.  He received Zyprexa 15 mg PO at 0453 and phenobarbital 130 mg IV at 0922.       Patient was seen interacting with primary team calm and cooperative.  He reported that same history as mentioned above as he says that he is being targeted by the "[employer] gang" and that his fianc was murdered by them yesterday who he reports works for the [employer].  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  He says he has been involved with reporting things to the FBI for the past 3 to 4 weeks and mentions other various grandiose delusions such as he is running for president of the [place], "leading the pulls above Biden and Trump" and that he has involvement and other various government organizations.  He denies suicidal ideations and says that he had only made statements to police of killing him because he wanted to take matters into his own hands before he would be killed by this getting.  He also mentions that about 3 to 4 weeks ago he was started on Zoloft by a primary care doctor although he has not able to say who this person was.  He mentions that this was prescribed for "anxiety" and that he had never taken a medication for anxiety in the past.  Prior to the last 3 to 4 weeks he says he is experienced intermittent anxiety throughout the day and that drinking vodka in the evening helps with this.  He says though that over the past 4 to 5 days he has not slept, has barely eaten because of being on the run from the scanning, has been taking on a lot of projects, has had a lot of energy despite not sleeping, and has felt like his mind is racing.  He denies any past psychiatric history and denies any family psychiatric history.  He reports being in [place] for the past 4 to 5 years but that he is originally from [place] and his other family members are there. He denies HI. He also denies any hallucinations.    Psychiatric ROS:  Safety: denies SI/HI   Depression: denies low mood or anhedonia  Mania: +decreased need for sleep, +increased goal directed activity, +grandiosity, +racing thoughts, +impulsivity  Psychosis: +paranoia/grandiosity  PTSD: denies  Panic/Anxiety Attacks: reports anxiety intermittently at baseline but not significant     PAST PSYCHIATRIC HISTORY:   Prior diagnosis: denies   Prior psychiatric hospitalizations: denies   Prior suicide attempts; self-injurious behaviors: denies   Treatment history: denies     Psychotropic Medications:   Current:  zoloft as of ~1 month prescribed by PCP for "anxiety"   History: none   Psychotherapies: none   Current psychiatrist/therapist: none      SUBSTANCE USE HISTORY:   Alcohol: Yes - daily vodka   Marijuana: No   Illicit drugs: No   Prescription drugs: No   Tobacco: No    MEDICAL/SURGICAL HISTORY:  Past Medical History:   Diagnosis Date    Hypertension      Allergies   Allergen Reactions    Penicillins Rash     Current Facility-Administered Medications   Medication Dose Route Frequency    chlordiazePOXIDE  25 mg Oral Q8H SCH    Followed by    Melene Muller ON 10/02/2020] chlordiazePOXIDE  25 mg Oral Q12H SCH    enoxaparin  40 mg Subcutaneous Daily    folic acid  1 mg Oral Daily    multivitamin  1 tablet Oral Daily    Plasma-Lyte A  500 mL Intravenous Once    thiamine (VITAMIN B-1) IVPB  100 mg Intravenous Daily       PSYCHOSOCIAL HISTORY:  Social History:  Early Life: Grew up in [place] and says much of his family is still there. Reports being in [place] for ~[place]-5 years   Education: high school diploma/GED   Income/Employment: not formally employed   Relationships/Family: reports having a daughter; otherwise says he had a fiance who was killed 12/30  Current Supports: unknown  Current Living Situation/Housing Status: living in a hotel   Hobbies:  Statistician History: he denies   Access to Weapons: denies    PSYCHIATRIC FAMILY HISTORY:  denies    REVIEW OF SYSTEMS (ROS):  ROS positive for:   Pertinent items are noted in HPI..   All other pertinent systems were reviewed and are negative or mentioned in HPI.   Also reviewed medical ROS documented by primary team and other services.     EXAMINATION:  Patient Vitals for the past 24 hrs:   BP Temp Temp src Pulse Resp SpO2 Height Weight   09/30/20 1330 159/79   (!) 52 18 99 %     09/30/20 1100 144/87   (!) 57  98 %     09/30/20 0921 (!) 175/97   81 18 98 %     09/30/20 0349 (!) 126/93 98 F (36.7 C) Skin (!) 118 18 96 % 1.803 m (5\' 11" ) 86.2 kg (190 lb)      General Appearance:  59 y.o. Caucasian male long hear wearing own clothes, NAD    Behavior:  Calm, Cooperative and Eye Contact is  Good   Speech:  Normal rate, normal volume, fluid, purposeful; interruptible with normal pitch   Mood:  " nervous "   Affect:  Appropriate/mood-congruent   Thought Process:  Linear, illogical at times but other time logical and goal directed   Thought content: No suicidal thoughts, No homicidal thoughts, +paranoid delusions +grandiose delusions   Perceptions:  denies current hallucinations although reported [facility] to [facility]     Orientation:  AAOx4      Attention and Concentration:  grossly intact   Memory:  Recent intact    Language:  Intact     Fund of knowledge:  Intact   Insight:  Poor   Judgment:  Poor   Physical Exam:  Musculoskeletal:  Bilateral hand tremor   Most recent physical exam as documented in the medical record has been reviewed.      Results  Procedure Component Value Units Date/Time    Osmolality [[id number]] Collected: 09/30/20 0852    Specimen: Blood Updated: 09/30/20 1000     Osmolality 294 mosm/kg     Basic Metabolic Panel [[id number]]  (Abnormal) Collected: 09/30/20 0852    Specimen: Blood Updated: 09/30/20 0937     Glucose 97 mg/dL      BUN 95.6 mg/dL      Creatinine 1.4 mg/dL       Calcium 8.0 mg/dL      Sodium 213 mEq/L      Potassium 3.9 mEq/L      Chloride 105 mEq/L      CO2 17 mEq/L      Anion Gap 14.0    GFR [[id number]] Collected: 09/30/20 0852     Updated: 09/30/20 0937     EGFR 51.8    Lactic Acid [[id number]] Collected: 09/30/20 0617    Specimen: Blood Updated: 09/30/20 0626     Lactic Acid 1.7 mmol/L     Rapid drug screen, urine [[id number]] Collected: 09/30/20 0551    Specimen: Urine Updated: 09/30/20 5366     Urine Amphetamine Screen Negative     Barbiturate Screen, UR Negative     Benzodiazepine Screen, UR Negative     Cannabinoid Screen, UR Negative     Cocaine, UR Negative     Opiate Screen, UR Negative     PCP Screen, UR Negative    UA Reflex to Micro - Reflex to Culture [[id number]]  (Abnormal) Collected: 09/30/20 0551    Specimen: Urine, Clean Catch Updated: 09/30/20 9563     Urine Type Urine, Clean Ca     Color, UA Yellow     Clarity, UA Clear     Specific Gravity UA 1.014     Urine pH 6.0     Leukocyte Esterase, UA Negative     Nitrite, UA Negative     Protein, UR 30     Glucose, UA 50     Ketones UA Negative     Urobilinogen, UA Normal mg/dL      Bilirubin, UA Negative     Blood, UA Moderate     RBC, UA 0 - 2 /hpf      WBC, UA 0 - 5 /hpf      Squamous Epithelial Cells, Urine 0 - 5 /hpf      Hyaline Casts, UA 11 - 25 /lpf     TSH [[id number]] Collected: 09/30/20 0503    Specimen: Blood Updated: 09/30/20 0555     TSH 1.02 uIU/mL     Salicylate Level [[id number]]  (Abnormal) Collected: 09/30/20 0503    Specimen: Blood Updated: 09/30/20 0541     Salicylate Level <5.0 mg/dL     Magnesium [[id number]] Collected: 09/30/20 0503    Specimen: Blood Updated: 09/30/20 0541     Magnesium 1.6 mg/dL     GFR [[id number]] Collected: 09/30/20 0503     Updated: 09/30/20 0541     EGFR 38.8    Comprehensive metabolic panel [[id number]]  (Abnormal) Collected: 09/30/20 0503    Specimen: Blood Updated: 09/30/20 0541     Glucose 119 mg/dL      BUN 37.6 mg/dL      Creatinine 1.8 mg/dL      Sodium 283 mEq/L       Potassium 3.1 mEq/L      Chloride 98 mEq/L      CO2 16 mEq/L      Calcium 9.1 mg/dL      Protein,  Total 7.3 g/dL      Albumin 3.8 g/dL      AST (SGOT) 34 U/L      ALT 17 U/L      Alkaline Phosphatase 76 U/L      Bilirubin, Total 0.4 mg/dL      Globulin 3.5 g/dL      Albumin/Globulin Ratio 1.1     Anion Gap 21.0    Ethanol (Alcohol) Level [[id number]]  (Abnormal) Collected: 09/30/20 0503    Specimen: Blood Updated: 09/30/20 0541     Alcohol 36 mg/dL     Acetaminophen Level [[id number]]  (Abnormal) Collected: 09/30/20 0503    Specimen: Blood Updated: 09/30/20 0541     Acetaminophen Level <7 ug/mL     CBC and differential [[id number]]  (Abnormal) Collected: 09/30/20 0503    Specimen: Blood Updated: 09/30/20 0533     WBC 7.60 x10 3/uL      Hgb 14.1 g/dL      Hematocrit 08.6 %      Platelets 246 x10 3/uL      RBC 4.18 x10 6/uL      MCV 98.6 fL      MCH 33.7 pg      MCHC 34.2 g/dL      RDW 12 %      MPV 9.3 fL      Neutrophils 72.8 %      Lymphocytes Automated 10.7 %      Monocytes 15.4 %      Eosinophils Automated 0.1 %      Basophils Automated 0.5 %      Immature Granulocytes 0.5 %      Nucleated RBC 0.0 /100 WBC      Neutrophils Absolute 5.53 x10 3/uL      Lymphocytes Absolute Automated 0.81 x10 3/uL      Monocytes Absolute Automated 1.17 x10 3/uL      Eosinophils Absolute Automated 0.01 x10 3/uL      Basophils Absolute Automated 0.04 x10 3/uL      Immature Granulocytes Absolute 0.04 x10 3/uL      Absolute NRBC 0.00 x10 3/uL     COVID-19 (SARS-COV-2) (ID Now)- Behavioral health admission (no isolation) [[id number]] Collected: 09/30/20 0503    Specimen: Nasopharyngeal Swab from Nasopharynx Updated: 09/30/20 0532     Purpose of COVID testing Screening     SARS-CoV-2 Specimen Source Nasopharyngeal     SARS CoV 2 Overall Result Negative    Narrative:      o Collect and clearly label specimen type:  o Upper respiratory specimen: One Nasopharyngeal Dry Swab NO  Transport Media.  o Hand deliver to laboratory ASAP  Indication  for testing->Behavioral health admission  Screening        DIAGNOSTIC STUDIES:  No results found for any visits on 09/30/20.    Qtc 477 with a HR of 86 performed on 09/30/20      DIAGNOSES:  1. Alcohol use disorder, severe c/b withdrawal and likely DTs  2. Acute encephalopathy/delirium due to general medical conditions - high suspicion of Wernicke's encephalopathy (also with multiple metabolic derangements)  3. Possible SSRI-induced mania    Pertinent Medical Diagnoses:    AKI, elevated AG   HTN, gout    ASSESSMENT:   Kristopher Rogers is a 59 y.o. with a medical history of HTN, gout, tremors and alcohol use disorder with otherwise no psychiatric history  who presented 09/30/2020 for complicated alcohol withdrawal and currently on TDO. Psychiatry CL service consulted for possible  TDO and odd behaviors.    While patient is a limited historian, he is able to provide a history of heavy regular alcohol use and limited PO intake that may predispose him to complications with alcohol use such as withdrawal/DTs and wernicke's encephalopathy. 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 Cannot fully evaluate for an underlying mood disorder in the setting of acute withdrawal/encephalopathy, however would not resume zoloft and attempt to further clarify with collateral if able while stabilizing his withdrawal. Agree with CIWA protocol and would increase thiamine to 500mg  IV q8h x3 days followed by PO supplementation. Patient has prolonged QTc so would avoid further doses of antipsychotics at this time. To help with sleep, would give scheduled dose of ativan 1mg  PO qhs in addition to utilizing ativan per CIWA protocol and for any possible agitation.  Patient is already on psych TDO and has hearing scheduled for 10/03/20.      RECOMMENDATIONS:   1. Safety:  pt s history of alcohol use and recent erratic behavior; medical illnesses (with impairment/loss of functioning), and multiple social stressors puts them at increased risk for acute intentional harm.  While he denies active thoughts of self harm, he presented with erratic behavior and highly delusional that has led him to have a psych TDO. Dispo pending hearing scheduled for 10/03/20 so patient is to remain on precautions until that time.     2. Encephalopathy/delirium and alcohol use  - high risk for withdrawal and probable wernicke's   - Recommendations   - CIWA protocol    - thiamine 500mg  IV q8h x3 days followed by PO supplementation   - can also check vit B12, folate and RPR if not already done so; TSH normal     3. Grandiose delusions and paranoia with no known history   - unclear if related to hyperactive delirium/encephalopathy or possibly SSRI-induced mania  - Recommendations:   - do not resume zoloft    - provide ativan 1mg  PO qhs to help with sleep (pt with prolonged QTc)      Patient is informed of and states understanding of potential risks and benefits of proposed treatment plan.    Disposition:   Pending psych TDO hearing 10/03/20    Psychiatry consult team will continue to follow the patient for this admission.    Thank you for allowing [place] to participate in the care of your patient.    Signed by:  Armond Hang, MD  Consultation-Liaison Psychiatry  [phone] (8am - 4:30pm):  [phone]   Date/Time: 09/30/20 4:29 PM

## 2020-10-01 DIAGNOSIS — F23 Brief psychotic disorder: Principal | ICD-10-CM

## 2020-10-01 DIAGNOSIS — I10 Essential (primary) hypertension: Secondary | ICD-10-CM

## 2020-10-01 DIAGNOSIS — R410 Disorientation, unspecified: Secondary | ICD-10-CM

## 2020-10-01 DIAGNOSIS — E876 Hypokalemia: Secondary | ICD-10-CM

## 2020-10-01 DIAGNOSIS — F22 Delusional disorders: Secondary | ICD-10-CM

## 2020-10-01 LAB — MAGNESIUM: Magnesium: 1.7 mg/dL (ref 1.6–2.6)

## 2020-10-01 LAB — CBC AND DIFFERENTIAL
Absolute NRBC: 0 10*3/uL (ref 0.00–0.00)
Basophils Absolute Automated: 0.02 10*3/uL (ref 0.00–0.08)
Basophils Automated: 0.4 %
Eosinophils Absolute Automated: 0.05 10*3/uL (ref 0.00–0.44)
Eosinophils Automated: 1.1 %
Hematocrit: 39.2 % (ref 37.6–49.6)
Hgb: 13.3 g/dL (ref 12.5–17.1)
Immature Granulocytes Absolute: 0.02 10*3/uL (ref 0.00–0.07)
Immature Granulocytes: 0.4 %
Lymphocytes Absolute Automated: 0.89 10*3/uL (ref 0.42–3.22)
Lymphocytes Automated: 19.6 %
MCH: 33.6 pg — ABNORMAL HIGH (ref 25.1–33.5)
MCHC: 33.9 g/dL (ref 31.5–35.8)
MCV: 99 fL — ABNORMAL HIGH (ref 78.0–96.0)
MPV: 9.5 fL (ref 8.9–12.5)
Monocytes Absolute Automated: 0.69 10*3/uL (ref 0.21–0.85)
Monocytes: 15.2 %
Neutrophils Absolute: 2.87 10*3/uL (ref 1.10–6.33)
Neutrophils: 63.3 %
Nucleated RBC: 0 /100 WBC (ref 0.0–0.0)
Platelets: 183 10*3/uL (ref 142–346)
RBC: 3.96 10*6/uL — ABNORMAL LOW (ref 4.20–5.90)
RDW: 12 % (ref 11–15)
WBC: 4.54 10*3/uL (ref 3.10–9.50)

## 2020-10-01 LAB — ECG 12-LEAD
Atrial Rate: 119 {beats}/min
P Axis: 41 degrees
P-R Interval: 154 ms
Q-T Interval: 334 ms
QRS Duration: 68 ms
QTC Calculation (Bezet): 469 ms
R Axis: -33 degrees
T Axis: 43 degrees
Ventricular Rate: 119 {beats}/min

## 2020-10-01 LAB — BASIC METABOLIC PANEL
Anion Gap: 13 (ref 5.0–15.0)
BUN: 27 mg/dL (ref 9.0–28.0)
CO2: 21 mEq/L — ABNORMAL LOW (ref 22–29)
Calcium: 8.6 mg/dL (ref 8.5–10.5)
Chloride: 105 mEq/L (ref 100–111)
Creatinine: 1.1 mg/dL (ref 0.7–1.3)
Glucose: 88 mg/dL (ref 70–100)
Potassium: 3.5 mEq/L (ref 3.5–5.1)
Sodium: 139 mEq/L (ref 136–145)

## 2020-10-01 LAB — GFR: EGFR: >60

## 2020-10-01 LAB — PHOSPHORUS: Phosphorus: 3.1 mg/dL (ref 2.3–4.7)

## 2020-10-01 MED ORDER — MAGNESIUM SULFATE IN D5W 1-5 GM/100ML-% IV SOLN
1.0000 g | INTRAVENOUS | Status: AC
Start: 2020-10-01 — End: 2020-10-01
  Administered 2020-10-01: 14:00:00 1 g via INTRAVENOUS
  Filled 2020-10-01: qty 100

## 2020-10-01 MED ORDER — AMLODIPINE BESYLATE 5 MG PO TABS
5.0000 mg | ORAL_TABLET | Freq: Every day | ORAL | Status: DC
Start: 2020-10-01 — End: 2020-10-02
  Administered 2020-10-01 – 2020-10-02 (×2): 5 mg via ORAL
  Filled 2020-10-01 (×2): qty 1

## 2020-10-01 MED ORDER — MAGNESIUM OXIDE 400 MG TABS (WRAP)
400.0000 mg | ORAL_TABLET | Freq: Two times a day (BID) | ORAL | Status: DC
Start: 2020-10-01 — End: 2020-10-02
  Administered 2020-10-01 – 2020-10-02 (×3): 400 mg via ORAL
  Filled 2020-10-01 (×3): qty 1

## 2020-10-01 MED ORDER — PROPRANOLOL HCL 10 MG PO TABS
20.0000 mg | ORAL_TABLET | Freq: Three times a day (TID) | ORAL | Status: DC
Start: 2020-10-01 — End: 2020-10-02
  Administered 2020-10-01 – 2020-10-02 (×5): 20 mg via ORAL
  Filled 2020-10-01 (×5): qty 2

## 2020-10-01 MED ORDER — HYDRALAZINE HCL 20 MG/ML IJ SOLN
10.0000 mg | Freq: Four times a day (QID) | INTRAMUSCULAR | Status: DC | PRN
Start: 2020-10-01 — End: 2020-10-02

## 2020-10-01 MED ORDER — LORAZEPAM 1 MG PO TABS
1.0000 mg | ORAL_TABLET | ORAL | Status: DC | PRN
Start: 2020-10-01 — End: 2020-10-02
  Administered 2020-10-02: 10:00:00 1 mg via ORAL
  Filled 2020-10-01: qty 1

## 2020-10-01 MED ORDER — LORAZEPAM 1 MG PO TABS
2.0000 mg | ORAL_TABLET | Freq: Every evening | ORAL | Status: DC
Start: 2020-10-01 — End: 2020-10-02
  Administered 2020-10-02: 23:00:00 2 mg via ORAL
  Filled 2020-10-01 (×2): qty 2

## 2020-10-01 MED ORDER — POTASSIUM CHLORIDE 20 MEQ PO PACK
40.0000 meq | PACK | Freq: Once | ORAL | Status: AC
Start: 2020-10-01 — End: 2020-10-01
  Administered 2020-10-01: 14:00:00 40 meq via ORAL
  Filled 2020-10-01: qty 2

## 2020-10-01 NOTE — OT Progress Note (Signed)
[facility]   Occupational Therapy Cancellation Note      Patient:  Kristopher Rogers MRN#:  [id number]  Unit:  [facility] [address] Room/Bed:  F1024/F1024.01    10/01/2020  Time: 1458      Patient not seen for occupational therapy secondary to pt pulled IV out right before OT entered room, nsging staff attending to pt. OT to follow up as appropriate/able.    Ihor Austin, OTR/L  Pager [phone]

## 2020-10-01 NOTE — Progress Notes (Signed)
PSYCHIATRIC CONSULTATION PROGRESS NOTE    Patient name:  Kristopher Rogers  Date of birth:  Jan 13, 1961  Age:  60 y.o.  MRN:  [id number]  CSN:  [id number]  Date of admission:  09/30/2020  Date of consultation:  10/01/2020  Attending/Referring physician:  Tamela Oddi, MD  Consulting Attending:  Armond Hang, MD  ==================================================  REASON FOR ADMISSION:  Alcohol withdrawal syndrome with complication [F10.239]    REASON FOR CONSULTATION:  Pt on TDO for possible SI    Interval History:     Overnight Events: no acute events reported. Pt took ativan and melatonin but declined librium as he felt like he was not in withdrawal. Otherwise no PRNs for withdrawal given. Started on amlodipine for high BP.     Patient seen this afternoon somewhat restless initially walking around room but able to sit for discussion. He stated he slept "about 3 or 4 hours" last night with ativan and asked if he could have something stronger. He feels like his thoughts are still going fast but then goes onto to say they "always go fast" and that he's "always thinking about the next steps". Asked about family history of bipolar disorder as it is mentioned in a linked chart in Care Everywhere that his mother has a history of bipolar disorder. He stated that his mother had "thyroid issues" so they removed her thyroid and gave her synthroid to supplement. He said she had "ups and downs" and said that it was "chemical" though but did see a metal health professional. We discussed the timeline of when he was started on zoloft and when his thoughts started to get faster. He was not sure if this could be a connection but did think maybe so and that he did not want to continue this medication if that was the case. He did not bring up as much delusional content although did not prompt him specifically. He said he has talked to his dad who is still alive and doing ok since being in the hospital but says he's been having  problems with his phone for the past few months or so. Denies SI/HI at this time. No hallucinations reported.        Medications:   Current medications and doses:    Current Facility-Administered Medications   Medication Dose Route Frequency Provider Last Rate Last Admin    acetaminophen (TYLENOL) tablet 650 mg  650 mg Oral Q6H PRN Volney Presser E, DO        Or    acetaminophen (TYLENOL) suppository 650 mg  650 mg Rectal Q6H PRN Joycelyn Das, DO        amLODIPine (NORVASC) tablet 5 mg  5 mg Oral Daily Borgschulte, Gitte, MD   5 mg at 10/01/20 1115    chlordiazePOXIDE (LIBRIUM) capsule 25 mg  25 mg Oral Q12H Hedden, Morgan E, DO        Followed by    Melene Muller ON 10/03/2020] chlordiazePOXIDE (LIBRIUM) capsule 25 mg  25 mg Oral Q12H SCH Hedden, Morgan E, DO        dextrose (GLUCOSE) 40 % oral gel 15 g of glucose  15 g of glucose Oral PRN Volney Presser E, DO        And    dextrose 50 % bolus 12.5 g  12.5 g Intravenous PRN Volney Presser E, DO        And    glucagon (rDNA) (GLUCAGEN) injection 1 mg  1 mg Intramuscular PRN Joycelyn Das, DO  enoxaparin (LOVENOX) syringe 40 mg  40 mg Subcutaneous Daily Volney Presser E, DO   40 mg at 09/30/20 8469    folic acid (FOLVITE) tablet 1 mg  1 mg Oral Daily Volney Presser E, DO   1 mg at 10/01/20 1000    hydrALAZINE (APRESOLINE) injection 10 mg  10 mg Intravenous Q6H PRN Borgschulte, Gitte, MD        lidocaine (LIDODERM) 5 % 1 patch  1 patch Transdermal Q24H PRN Volney Presser E, DO   1 patch at 09/30/20 2135    LORazepam (ATIVAN) injection 1 mg  1 mg Intravenous QHS PRN Volney Presser E, DO   1 mg at 09/30/20 2116    LORazepam (ATIVAN) injection 1-2 mg  1-2 mg Intravenous Q1H PRN Volney Presser E, DO        melatonin tablet 3 mg  3 mg Oral QHS PRN Volney Presser E, DO   3 mg at 09/30/20 2116    multivitamin tablet 1 tablet  1 tablet Oral Daily Joycelyn Das, DO   1 tablet at 10/01/20 1000    naloxone (NARCAN) injection 0.2 mg  0.2 mg Intravenous PRN  Volney Presser E, DO        thiamine (B-1) 500 mg in sodium chloride 0.9 % 100 mL IVPB  500 mg Intravenous Q8H SCH Hedden, Morgan E, DO 200 mL/hr at 10/01/20 0517 500 mg at 10/01/20 6295         EXAMINATION:  General Appearance:  60 y.o. Caucasian male long hair wearing hospital gown in NAD   Behavior:  Calm, Cooperative and Eye Contact is  Good   Speech:  Normal rate, normal volume, fluid, purposeful; interruptible with normal pitch   Mood:  " ok "   Affect:  Anxious/Nervous   Thought Process:  Linear, goal directed and Coherent/logical   Thought content: No suicidal thoughts, No homicidal thoughts, did not report any delusions    Perceptions:  No Visual/auditory/tactile/olfactory/gustatory/illusions      Orientation:  AAOx4      Attention and Concentration:  grossly intact   Memory:  Recent and remote intact    Language:  Intact     Fund of knowledge:  Intact   Insight:  Limited   Judgment:  Limited   Physical Exam:  Musculoskeletal:  Muscle bulk and tone are grossly intact; mild bilateral upper extremity tremor noted   Most recent physical exam as documented in the medical record has been reviewed.    Review of Systems:   ROS positive for:  A comprehensive review of systems was negative..   All other pertinent systems were reviewed and are negative unless mentioned in HPI.   Also reviewed medical ROS documented by primary team and other services.     Vital Signs:     Vitals:    10/01/20 1115   BP: 151/82   Pulse:    Resp:    Temp:    SpO2:         Laboratory Assessment:     Results     Procedure Component Value Units Date/Time    GFR [[id number]] Collected: 10/01/20 0101     Updated: 10/01/20 0159     EGFR >60.0    Narrative:      Starting For 1 Occurences  QAMLAB X 3    Basic Metabolic Panel [[id number]]  (Abnormal) Collected: 10/01/20 0101    Specimen: Blood Updated: 10/01/20 0159     Glucose 88 mg/dL  BUN 27.0 mg/dL      Creatinine 1.1 mg/dL      Calcium 8.6 mg/dL      Sodium 161 mEq/L      Potassium 3.5  mEq/L      Chloride 105 mEq/L      CO2 21 mEq/L      Anion Gap 13.0    Narrative:      Starting For 1 Occurences  QAMLAB X 3    Phosphorus [[id number]] Collected: 10/01/20 0101    Specimen: Blood Updated: 10/01/20 0159     Phosphorus 3.1 mg/dL     Narrative:      Starting For 1 Occurences  QAMLAB X 3    Magnesium [[id number]] Collected: 10/01/20 0101    Specimen: Blood Updated: 10/01/20 0159     Magnesium 1.7 mg/dL     Narrative:      Starting For 1 Occurences  QAMLAB X 3    CBC and differential [[id number]]  (Abnormal) Collected: 10/01/20 0101    Specimen: Blood Updated: 10/01/20 0133     WBC 4.54 x10 3/uL      Hgb 13.3 g/dL      Hematocrit 57.8 %      Platelets 183 x10 3/uL      RBC 3.96 x10 6/uL      MCV 99.0 fL      MCH 33.6 pg      MCHC 33.9 g/dL      RDW 12 %      MPV 9.5 fL      Neutrophils 63.3 %      Lymphocytes Automated 19.6 %      Monocytes 15.2 %      Eosinophils Automated 1.1 %      Basophils Automated 0.4 %      Immature Granulocytes 0.4 %      Nucleated RBC 0.0 /100 WBC      Neutrophils Absolute 2.87 x10 3/uL      Lymphocytes Absolute Automated 0.89 x10 3/uL      Monocytes Absolute Automated 0.69 x10 3/uL      Eosinophils Absolute Automated 0.05 x10 3/uL      Basophils Absolute Automated 0.02 x10 3/uL      Immature Granulocytes Absolute 0.02 x10 3/uL      Absolute NRBC 0.00 x10 3/uL     Narrative:      Starting For 1 Occurences  QAMLAB X 3    Rapid drug screen, urine (If not already done) [[id number]]  (Abnormal) Collected: 09/30/20 2223    Specimen: Urine Updated: 09/30/20 2256     Urine Amphetamine Screen Negative     Barbiturate Screen, UR Positive     Benzodiazepine Screen, UR Negative     Cannabinoid Screen, UR Negative     Cocaine, UR Negative     Opiate Screen, UR Negative     PCP Screen, UR Negative    Narrative:      Drug Screen, Urine Random        DIAGNOSTIC STUDIES:  No results found for any visits on 09/30/20.    QTc: 477 ([place]) with a HR of 86 performed on 09/30/20    Assessment/Plan      DIAGNOSES:  1. Alcohol use disorder, severe c/b withdrawal and likely DTs  2. Acute encephalopathy/delirium due to general medical conditions - high suspicion of Wernicke's encephalopathy (also with multiple metabolic derangements)  3. Possible SSRI-induced mania    Pertinent Medical Diagnoses:    Prolonged QTc  AKI, elevated AG   HTN, gout      ASSESSMENT :  Chauncy Mangiaracina is a 60 y.o. with a medical history of HTN, gout, tremors and alcohol use disorder with otherwise no psychiatric history  who presented 09/30/2020 for complicated alcohol withdrawal and currently on TDO. Psychiatry CL service consulted for possible TDO and odd behaviors.    Patient was started on high dose thiamine for possible wernicke's (alcohol use and limited PO intake) and given fluids. He received nighttime dose of ativan 1mg  to help with sleep and due to limited options because of prolonged QTc. Today, patient seems somewhat less paranoid and did not mention any specific delusions but was not asked specifically. He does have a family history of bipolar disorder in his mother making it possible for the zoloft to possibly trigger mania although, again, it is difficult in the setting of resolving encephalopathy. Would re-check QTc and check CK (pt with AKI, blood in urine and elevated activity prior to coming in) as he would benefit from an antipsychotic to help resolve any potential mood episode and further help with sleep such as low dose seroquel 25mg  qhs. Continue with high dose thiamine and please check vitamin B12, folate and RPR to rule out other reversible causes. In the interim, would continue with CIWA protocol and depending on patient's QTc, if still prolonged would increase nighttime ativan to 2mg  qhs (pt only slept 3-4 hours with 1mg ).     RECOMMENDATIONS:   1. Safety:  pt s history of alcohol use and recent erratic behavior; medical illnesses (with impairment/loss of functioning), and multiple social stressors puts  them at increased risk for acute intentional harm.  While he denies active thoughts of self harm, he presented with erratic behavior and highly delusional that has led him to have a psych TDO. Dispo pending hearing scheduled for 10/03/20 so patient is to remain on precautions until that time.       2. Encephalopathy/delirium and alcohol use  - high risk for withdrawal and probable wernicke's   - Recommendations              - CIWA protocol               - continue with high dose thiamine 500mg  IV q8h x2 more days followed by PO supplementation   - please check vit B12, folate and RPR; TSH normal     3. 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    Disposition:   Pending psych TDO hearing 10/03/20    Psychiatry consult team will continue to follow the patient for this admission.    Thank you for allowing [place] to participate in the care of your patient.    Signed by:  Armond Hang, MD  Consultation-Liaison Psychiatry  [phone] (8am - 4:30pm):  [phone]   Date/Time: 10/01/20 1:46 PM

## 2020-10-01 NOTE — PT Eval Note (Signed)
[facility]   Physical Therapy Evaluation   Patient: Kristopher Rogers    MRN#: [id number]   Unit: [facility]  Bed: F1024/F1024.01      Post Acute Care Therapy Recommendations:   Discharge Recommendations: Home with supervision,Home with home health PT     Milestones to be reached to achieve recommendation: none  Anticipate achievement in n/a sessions    DME Recommended for Discharge:  (will rec pending pt progress w/ gait training)    If Home with supervision,Home with home health PT recommended discharge disposition is not available, patient will need snf placement.     Therapy discharge recommendations may change with patient status.  Please refer to most recent note for up-to-date recommendations.    Assessment:   Significant Findings: none    Kristopher Rogers is a 60 y.o. male admitted 09/30/2020.  Patient presents with ataxic gait and impaired functional mobility. The pt is mod I with amb and ADLs at baseline. Today he req min A to stand and min A steadying for safety ambulating in the hospital room. The pt demos wide based gait with short step-to step lengths. He will benefit from gait training with assistive device but is not agreeable to that this session. This pt will benefit from physical therapy intervention to address deficits and facilitate safe return to PLOF. Rec [place] home with supervision and HHPT when medically appropriate.      Assessment: Decreased LE ROM;Decreased LE strength;Decreased safety/judgement during functional mobility;Decreased functional mobility;Decreased balance;Gait impairment.     Therapy Diagnosis: ataxia, impaired functional mobility    Rehabilitation Potential: Prognosis: Good;With continued PT status post acute discharge    Treatment Activities: evaluation    Educated the patient to role of physical therapy, plan of care, goals of therapy and safety with mobility and ADLs, home safety.    Plan:   Treatment/Interventions: Exercise,Gait  training,Stair training,Neuromuscular re-education,Functional transfer training,LE strengthening/ROM     PT Frequency: 3-4x/wk   Risks/Benefits/POC Discussed with Pt/Family: With patient        Precautions and Contraindications:   Weight Bearing Status: no restrictions  Other Precautions: has 1:1 Nutritional therapist received for Kristopher Rogers for PT Evaluation and Treatment.  Patients medical condition is appropriate for Physical therapy intervention at this time.    Medical Diagnosis: Alcohol withdrawal syndrome with complication [F10.239]      History of Present Illness:   Kristopher Rogers is a 60 y.o. male admitted on 09/30/2020 with alcohol withdrawal syndrome complicated by AKI and delusions. Psych on board.    Past Medical/Surgical History:  Past Medical History:   Diagnosis Date    Hypertension        X-Rays/Tests/Labs:  None this admission.     Social History:   Prior Level of Function:  Prior level of function: Independent with ADLs,Ambulates independently  Baseline Activity Level: Community ambulation  DME Currently at Home:  (none)    Home Living Arrangements:  Living Arrangements: Alone  Type of Home: Homeless (Pt reports living in a hotel currently)  Home Layout: One level,Stairs to enter with rails (add number in comment) (2 FOS to hotel room, R asc handrail)  DME Currently at Home:  (none)  Home Living - Notes / Comments: reports plans to [place] to a friends apt    Subjective: "Happy New Year" at end of session   Patient is agreeable to participation in the therapy session.      Patient Goal: get  better    Pain Assessment  Pain Assessment: Wong-Baker FACES (reports right knee and left ankle pain and swelling at rest)  Wong-Baker FACES Pain Rating: Hurts little bit  POSS Score: Awake and Alert  Pain Location: Knee;Ankle    Objective:   Observation of Patient/Vital Signs:  Patient is in bed with no medical equipment in place. 1:1 sitter is at the bedside.  Pt wore mask during therapy session:No       Observation of Patient/Vital signs:  Inspection/Posture: supine, no distress. Sitter present. Security present/observing    Cognition/Neuro Status  Arousal/Alertness: Appropriate responses to stimuli  Attention Span: Appears intact  Orientation Level: Oriented X4  Memory: Appears intact  Following Commands: Follows multistep commands consistently  Safety Awareness: minimal verbal instruction  Insights: Decreased awareness of deficits  Problem Solving: Assistance required to identify errors made;Assistance required to implement solutions;minimal assistance  Behavior: calm;cooperative  Motor Planning: ataxia  Coordination: GMC impaired    Musculoskeletal Examination:  Gross ROM  Neck/Trunk ROM: within functional limits  Right Lower Extremity ROM: within functional limits (no terminal knee extension)  Left Lower Extremity ROM: within functional limits (no terminal knee extension and decr L ankle ROM)    Gross Strength  Right Lower Extremity Strength: 3-/5  Left Lower Extremity Strength: 3-/5    Tone  Tone: within functional limits    Functional Mobility:  Supine to Sit: Supervision  Scooting to EOB: Independent  Sit to Supine: Supervision  Sit to Stand: Minimal Assist  Stand to Sit: Minimal Assist       Ambulation:  PMP - Progressive Mobility Protocol   PMP Activity: Step 6 - Walks in Room  Distance Walked (ft) (Step 6,7): 40 Feet     Ambulation: Minimal Assist  Pattern: Wide BOS;decreased step length;Step to  Number of Stairs: 0 (pt declind to leave the room to incr amb distance or stairs)     Balance:  Balance: needs focused assessment  Sitting - Static: Good  Sitting - Dynamic: Fair  Standing - Static: Poor (req hands on assist)  Standing - Dynamic: Poor (req hands on assist)       Participation and Activity Tolerance:  Participation Effort: fair  Endurance: Tolerates < 10 min exercise, no significant change in vital signs      Patient left with call bell within reach, all needs met, SCDs off as found, fall mat  in place as found, bed alarm as found, chair alarm n/a and all questions answered. RN notified of session outcome and patient response.       Goals:   Goals  Goal Formulation: With patient  Time for Goal Acheivement: 5 visits  Goals: Select goal  Pt Will Perform Sit to Stand: modified independent  Pt Will Ambulate: 101-150 feet,with single point cane  Pt Will Go Up / Down Stairs: 1 flight,With rail       PPE worn during session: procedural mask, goggles and gloves    Tech present: No  PPE worn by tech: N/A    Barnie Alderman PT, DPT  Pager [phone]    Time of treatment:   PT Received On: 10/01/20  Start Time: 0454  Stop Time: 0981  Time Calculation (min): 23 min

## 2020-10-01 NOTE — Progress Notes (Signed)
MEDICINE PROGRESS NOTE    Date Time: 10/01/20 1:39 PM  Patient Name: Kristopher Rogers  Attending Physician: Tamela Oddi, MD    Assessment:   Kristopher Rogers is a 60 year old male with no known psychiatric hx ( but does have a hx of ETOH abuse) who walked into a [facility] and said he wanted to kill himself with scissors or OTC pills.  He was also making other paranoid comments and with auditory and visual hallucinations.  The Police was called and he was place on an ECO later converted to TDO and brought to the ER here.     Upon presentation here he appeared anxious and claimed that he had not had a drink in 3 days. ETOH level was 36. His tox screen was neg ( a second tox screen was also done and was + for phenobarb but this was after he had received phenobarbital in the ER ).       Psych ( Dr. Darliss Ridgel)  consulted and did not want to admit him directly to Psych given than he had no psychiatric hx and the concern for alcohol withdrawal.  Vitals were stable with sious tachycardia. No fevers. Covid test neg. EKG with QTC 509.   Normal WBC, Cr 1.8 ( unknown baseline), k 3.1, Co2 16. TSH normal. UA neg.    He was given phenobarbital and later librium and ativan. He received fluids iv, thiamine and folate.     1/1: He took his iv out and refused iv medication . He denies that he could be in alcohol withdrawal and states it has been 3-4 days since his last drink. He states he has essential tremors and used to take propranolol and would like to resume this. Bp high today 175/70    A/P  # Acute psychosis -> suspect he has an undiagnosed underlying psychiatric disorder flare rather than ETOH withdrawal/DTs.  Schizophrenic, ? Bipolar,  questionable ETOH related/DTs.   # ETOH abuse  ( ? Drinks 3 drinks/day-> normal LFTS, alcohol level 36 on admit)  # Essential tremors -> doubt tremors from etoh withdrawal   # AKI- > likely from poor fluid intake from acute psychosis, unclear compliance with his losartan.  Improved   # Prolonged  QTc 509   # HTN-> high this am. Losartan on hold for AKI  # Gout   # Hypomagnesemia     Plan:  - Start norvasc and propranolol ( used to take for essential tremors)  - Cont. Librium , prn ativan po ( pulled out iv)  - Replace mg and k  - Monitor renal fct   - Sitter   - Thiamine/Folate   - On TDO-> F/u Psych, TDO hearing 1/3 ( Monday)  - EKG ordered to re-check QTc  - Likely transfer to inpt psych for further eval tomorrow   - Check b12, folate, RPR             Physical Exam:     VITAL SIGNS PHYSICAL EXAM   Temp:  [97.5 F (36.4 C)-98.8 F (37.1 C)] 98 F (36.7 C)  Heart Rate:  [78-102] 101  Resp Rate:  [16-19] 18  BP: (143-192)/(80-114) 151/82  Blood Glucose:      Intake/Output Summary (Last 24 hours) at 10/01/2020 1339  Last data filed at 10/01/2020 0520  Gross per 24 hour   Intake 250 ml   Output 300 ml   Net -50 ml    Physical Exam  General: awake, alert X pleasant, no hallucinating,  does not seem paranoid. Cardiovascular: regular rate and rhythm, no murmurs, rubs or gallops  Lungs: clear to auscultation bilaterally, without wheezing, rhonchi, or rales  Abdomen: soft, non-tender, non-distended; no palpable masses,  normoactive bowel sounds  Extremities: no edema  Other: mild hand tremor       Meds:     Medications were reviewed:  Current Facility-Administered Medications   Medication Dose Route Frequency    amLODIPine  5 mg Oral Daily    chlordiazePOXIDE  25 mg Oral Q12H    Followed by    Melene Muller ON 10/03/2020] chlordiazePOXIDE  25 mg Oral Q12H SCH    enoxaparin  40 mg Subcutaneous Daily    folic acid  1 mg Oral Daily    magnesium sulfate  1 g Intravenous Q1H    multivitamin  1 tablet Oral Daily    potassium chloride  40 mEq Oral Once    propranolol  20 mg Oral Q8H SCH    thiamine (VITAMIN B-1) IVPB  500 mg Intravenous Q8H SCH     Current Facility-Administered Medications   Medication Dose Route Frequency Last Rate     Current Facility-Administered Medications   Medication Dose Route    acetaminophen   650 mg Oral    Or    acetaminophen  650 mg Rectal    dextrose  15 g of glucose Oral    And    dextrose  12.5 g Intravenous    And    glucagon (rDNA)  1 mg Intramuscular    hydrALAZINE  10 mg Intravenous    lidocaine  1 patch Transdermal    LORazepam  1 mg Intravenous    LORazepam  1-2 mg Intravenous    melatonin  3 mg Oral    naloxone  0.2 mg Intravenous         Labs:     Labs (last 72 hours):    Recent Labs   Lab 10/01/20  0101 09/30/20  0503   WBC 4.54 7.60   Hgb 13.3 14.1   Hematocrit 39.2 41.2   Platelets 183 246          Recent Labs   Lab 10/01/20  0101 09/30/20  0852 09/30/20  0503 09/30/20  0503   Sodium 139 136  More results in Results Review 135*   Potassium 3.5 3.9  More results in Results Review 3.1*   Chloride 105 105  More results in Results Review 98*   CO2 21* 17*  More results in Results Review 16*   BUN 27.0 34.0*  More results in Results Review 37.0*   Creatinine 1.1 1.4*  More results in Results Review 1.8*   Calcium 8.6 8.0*  More results in Results Review 9.1   Albumin  --   --   --  3.8   Protein, Total  --   --   --  7.3   Bilirubin, Total  --   --   --  0.4   Alkaline Phosphatase  --   --   --  76   ALT  --   --   --  17   AST (SGOT)  --   --   --  34   Glucose 88 97  More results in Results Review 119*   More results in Results Review = values in this interval not displayed.                   Microbiology, reviewed and are significant  for:  Microbiology Results (last 15 days)     Procedure Component Value Units Date/Time    COVID-19 (SARS-COV-2) (ID Now)- Behavioral health admission (no isolation) [[id number]] Collected: 09/30/20 0503    Order Status: Completed Specimen: Nasopharyngeal Swab from Nasopharynx Updated: 09/30/20 0532     Purpose of COVID testing Screening     SARS-CoV-2 Specimen Source Nasopharyngeal     SARS CoV 2 Overall Result Negative     Comment: Test performed using the Abbott ID NOW EUA assay.  Please see Fact Sheets for patients and providers located  at:  http://www.rice.biz/    This test is for the qualitative detection of SARS-CoV-2  (COVID19) nucleic acid. Viral nucleic acids may persist in vivo,  independent of viability. Detection of viral nucleic acid does  not imply the presence of infectious virus, or that virus  nucleic acid is the cause of clinical symptoms. Negative  results should be treated as presumptive and, if inconsistent  with clinical signs and symptoms or necessary for patient  management, should be tested with an alternative molecular  assay. Negative results do not preclude SARS-CoV-2 infection  and should not be used as the sole basis for patient  management decisions. Invalid results may be due to inhibiting  substances in the specimen and recollection should occur.         Narrative:      o Collect and clearly label specimen type:  o Upper respiratory specimen: One Nasopharyngeal Dry Swab NO  Transport Media.  o Hand deliver to laboratory ASAP  Indication for testing->Behavioral health admission  Screening          Signed by: Tamela Oddi, MD

## 2020-10-01 NOTE — Plan of Care (Signed)
Medicine Progress note:  1:1 sitter at bedside and security outside room.   CIWA q8h per protocol.   Pt refused night time scheduled ativan and morning librium.   6 AM EKG done.    Neuro: Pt is A&Ox4, calm and co-operative, able to make needs known. Pt states he is not suicidal at all.   Cardiac: Pt not on tele. No c/o chest pain.  Pulm: Regular respirations,no sob or desats noted, on RA.  GI/GU: Pt is continent of bowel and bladder. Abd soft, non distended and active BS. No BM for this shift, pt uses urinal to void.  Skin: Scattered bruising and scars.   Pain: No c/o pain.  Mobility: x1 standby assist.  Safety: Purposeful rounding done. Falls precautions in place, bed in the lowest position, exit bed alarm on, fall mats X1 at bedside, 3/4 side rails up, personal belongings and call light within reach.    Patient Lines/Drains/Airways Status     Active Lines, Drains and Airways     None                 Intake/Output Summary (Last 24 hours) at 10/01/2020 2011  Last data filed at 10/01/2020 1300  Gross per 24 hour   Intake    Output 500 ml   Net -500 ml         Problem: Compromised Tissue integrity  Goal: Damaged tissue is healing and protected  Outcome: Progressing  Goal: Nutritional status is improving  Outcome: Progressing     Problem: Moderate/High Fall Risk Score >5  Goal: Patient will remain free of falls  Outcome: Progressing     Problem: Safety  Goal: Patient will be free from injury during hospitalization  Outcome: Progressing     Problem: Pain  Goal: Pain at adequate level as identified by patient  Outcome: Progressing     Problem: Side Effects from Pain Analgesia  Goal: Patient will experience minimal side effects of analgesic therapy  Outcome: Progressing     Problem: Psychosocial and Spiritual Needs  Goal: Demonstrates ability to cope with hospitalization/illness  Outcome: Progressing

## 2020-10-01 NOTE — Progress Notes (Signed)
Medicine Progress note:  1:1 sitter at bedside, security outside the room. Pt pulled out his IV and refused to put new IV. Pt was explained multiple times that IV needs to be reinserted for his medication, still continue to refuse. Pt said he will take po meds instead of IV meds. Attending updated.    Neuro: Pt is A&Ox3, co-operative, able to make needs known, anxious at times, redirected easily.     Cardiac: No tele, denies CP.    Pulm: Regular respirations, on RA.     GI/GU: Pt is continent of bowel and bladder.    Skin:Scattered bruising/scars.    Pain: Denies    Mobility: Stand by assist.    Safety: Purposeful rounding done. Falls precautions in place, bed in the lowest position, exit bed alarm on, fall mats in place, 3/4 side rails up, personal belongings and call light within reach.     Patient Lines/Drains/Airways Status     Active Lines, Drains and Airways     None                 Intake/Output Summary (Last 24 hours) at 10/01/2020 1734  Last data filed at 10/01/2020 1300  Gross per 24 hour   Intake 250 ml   Output 500 ml   Net -250 ml

## 2020-10-01 NOTE — UM Notes (Signed)
09/30/20 1326  Adult Admit to Inpatient ([facility] Only)  Once        Diagnosis: Alcohol Withdrawal Syndrome With Complication    Level of Care: Acute    Patient Class: Inpatient       References:    [facility] Bed Placement Criteria    [facility] Bed Placement Criteria    [facility] Bed Placement Criteria    [facility] Bed Placement Criteria    [facility] Bed Placement Criteria   Question Answer Comment   Admitting Physician GIBSON, KATE    Service: Medicine    Estimated Length of Stay > or = to 2 midnights    Tentative Discharge Plan? Home or Self Care    Does patient need telemetry? Yes    Telemetry type (separate Telemetry order is also required): Medical telemetry              10/01/2020 Initial Inpatient Review    ED admission on 12/31  C/C: alcohol withdrawal, suicidal ideation    60yo male with h/o etoh use, HTN, gout, and tremors who was brought in by EMS/police after pt noted to be having hallucinations and concern that someone is out to hurt him. He does have significant h/o etoh use, last drink 1-3 days ago (pt has told different people different things). Pt does not think he is going through etoh withdrawal, he reports having issues with someone trying to harm him for the past weeks to months which he states is very real and he has reported it to the FBI in the past. He denies any visual or auditory hallucinations. He does have a tremor which he reports has been present for several years.     VS: 175/97, 118, 18, 97.5, 97%    Abn labs: UA( protein 30, glucose 50, blood moderate, hyaline casts 11-25), glucose 119, BUN 37, Cr 1.8, sodium 135, chloride 98, co2 16, anion gap 21, alcohol 36, acetaminophen <7, salicylates level <5.0, GFR 38.8    UJW:JXBJYN FIBRILLATION     Admitted to med floor under Inpt status    MD plan  - as per ED provider, pt became more calm after receiving phenobarb in the ED  - CIWA protocol  - librium 25mg  q12h, can likely taper quickly  - psych consult given thoughts that someone is out to hurt him and possible  delusions  - suicide precautions  - CATS consult  - monitor AKI, improving with IVF  - hold ARB in setting of AKI  - thiamine for possible Wernicke's encephalopathy  - psych TDO is in place, security at bedside  DVT ppx: lovenox    Psych consult for Patient on TDO; possible SI   -do not resume zoloft   - provide ativan 1mg  PO qhs to help with sleep   - CIWA protocol    - thiamine 500mg  IV q8h x3 days followed by PO supplementation   Dispo pending hearing scheduled for 10/03/20 so patient is to remain on precautions until that time.     Continue 1:1 sitter      Scheduled Meds:  Current Facility-Administered Medications   Medication Dose Route Frequency    chlordiazePOXIDE  25 mg Oral Q12H    Followed by    Melene Muller ON 10/03/2020] chlordiazePOXIDE  25 mg Oral Q12H SCH    enoxaparin  40 mg Subcutaneous Daily    folic acid  1 mg Oral Daily    multivitamin  1 tablet Oral Daily    thiamine (VITAMIN B-1) IVPB  500 mg Intravenous Q8H SCH     Amaryllis Dyke, RN MSN  Utilization Review  Case Management Department  [phone]  [facility]

## 2020-10-02 ENCOUNTER — Inpatient Hospital Stay
Admission: AD | Admit: 2020-10-02 | Discharge: 2020-10-20 | DRG: 750 | Disposition: A | Discharge status: Home / Self Care | Payer: PRIVATE HEALTH INSURANCE | Attending: Internal Medicine | Admitting: Internal Medicine

## 2020-10-02 DIAGNOSIS — F312 Bipolar disorder, current episode manic severe with psychotic features: Secondary | ICD-10-CM

## 2020-10-02 DIAGNOSIS — F39 Unspecified mood [affective] disorder: Secondary | ICD-10-CM

## 2020-10-02 DIAGNOSIS — I10 Essential (primary) hypertension: Secondary | ICD-10-CM | POA: Diagnosis present

## 2020-10-02 DIAGNOSIS — F419 Anxiety disorder, unspecified: Secondary | ICD-10-CM

## 2020-10-02 DIAGNOSIS — M109 Gout, unspecified: Secondary | ICD-10-CM | POA: Diagnosis present

## 2020-10-02 DIAGNOSIS — T380X5A Adverse effect of glucocorticoids and synthetic analogues, initial encounter: Secondary | ICD-10-CM | POA: Diagnosis present

## 2020-10-02 DIAGNOSIS — M79641 Pain in right hand: Secondary | ICD-10-CM | POA: Diagnosis present

## 2020-10-02 DIAGNOSIS — N179 Acute kidney failure, unspecified: Secondary | ICD-10-CM | POA: Diagnosis not present

## 2020-10-02 DIAGNOSIS — U071 COVID-19: Secondary | ICD-10-CM | POA: Diagnosis not present

## 2020-10-02 DIAGNOSIS — F200 Paranoid schizophrenia: Principal | ICD-10-CM | POA: Diagnosis present

## 2020-10-02 DIAGNOSIS — R45851 Suicidal ideations: Secondary | ICD-10-CM | POA: Diagnosis present

## 2020-10-02 DIAGNOSIS — F29 Unspecified psychosis not due to a substance or known physiological condition: Secondary | ICD-10-CM | POA: Diagnosis present

## 2020-10-02 DIAGNOSIS — F24 Shared psychotic disorder: Secondary | ICD-10-CM | POA: Diagnosis present

## 2020-10-02 DIAGNOSIS — G250 Essential tremor: Secondary | ICD-10-CM | POA: Diagnosis not present

## 2020-10-02 DIAGNOSIS — F19959 Other psychoactive substance use, unspecified with psychoactive substance-induced psychotic disorder, unspecified: Secondary | ICD-10-CM | POA: Diagnosis present

## 2020-10-02 DIAGNOSIS — I951 Orthostatic hypotension: Secondary | ICD-10-CM | POA: Diagnosis not present

## 2020-10-02 DIAGNOSIS — Z5189 Encounter for other specified aftercare: Secondary | ICD-10-CM

## 2020-10-02 DIAGNOSIS — T1491XA Suicide attempt, initial encounter: Secondary | ICD-10-CM | POA: Diagnosis present

## 2020-10-02 DIAGNOSIS — Z79899 Other long term (current) drug therapy: Secondary | ICD-10-CM

## 2020-10-02 DIAGNOSIS — F10232 Alcohol dependence with withdrawal with perceptual disturbance: Secondary | ICD-10-CM | POA: Diagnosis not present

## 2020-10-02 LAB — HEPATIC FUNCTION PANEL
ALT: 12 U/L (ref 0–55)
AST (SGOT): 17 U/L (ref 5–34)
Albumin/Globulin Ratio: 1 (ref 0.9–2.2)
Albumin: 3 g/dL — ABNORMAL LOW (ref 3.5–5.0)
Alkaline Phosphatase: 60 U/L (ref 38–106)
Bilirubin Direct: 0.3 mg/dL (ref 0.0–0.5)
Bilirubin Indirect: 0.2 mg/dL (ref 0.2–1.0)
Bilirubin, Total: 0.5 mg/dL (ref 0.2–1.2)
Globulin: 3 g/dL (ref 2.0–3.6)
Protein, Total: 6 g/dL (ref 6.0–8.3)

## 2020-10-02 LAB — CBC AND DIFFERENTIAL
Absolute NRBC: 0 10*3/uL (ref 0.00–0.00)
Basophils Absolute Automated: 0.02 10*3/uL (ref 0.00–0.08)
Basophils Automated: 0.4 %
Eosinophils Absolute Automated: 0.03 10*3/uL (ref 0.00–0.44)
Eosinophils Automated: 0.6 %
Hematocrit: 39.4 % (ref 37.6–49.6)
Hgb: 13.3 g/dL (ref 12.5–17.1)
Immature Granulocytes Absolute: 0.03 10*3/uL (ref 0.00–0.07)
Immature Granulocytes: 0.6 %
Lymphocytes Absolute Automated: 1.09 10*3/uL (ref 0.42–3.22)
Lymphocytes Automated: 20.3 %
MCH: 33.4 pg (ref 25.1–33.5)
MCHC: 33.8 g/dL (ref 31.5–35.8)
MCV: 99 fL — ABNORMAL HIGH (ref 78.0–96.0)
MPV: 9.8 fL (ref 8.9–12.5)
Monocytes Absolute Automated: 1.02 10*3/uL — ABNORMAL HIGH (ref 0.21–0.85)
Monocytes: 19 %
Neutrophils Absolute: 3.19 10*3/uL (ref 1.10–6.33)
Neutrophils: 59.1 %
Nucleated RBC: 0 /100 WBC (ref 0.0–0.0)
Platelets: 212 10*3/uL (ref 142–346)
RBC: 3.98 10*6/uL — ABNORMAL LOW (ref 4.20–5.90)
RDW: 12 % (ref 11–15)
WBC: 5.38 10*3/uL (ref 3.10–9.50)

## 2020-10-02 LAB — PHOSPHORUS: Phosphorus: 2.8 mg/dL (ref 2.3–4.7)

## 2020-10-02 LAB — ECG 12-LEAD
Atrial Rate: 70 {beats}/min
P Axis: 54 degrees
P-R Interval: 172 ms
Q-T Interval: 490 ms
QRS Duration: 74 ms
QTC Calculation (Bezet): 529 ms
R Axis: -19 degrees
T Axis: 8 degrees
Ventricular Rate: 70 {beats}/min

## 2020-10-02 LAB — BASIC METABOLIC PANEL
Anion Gap: 13 (ref 5.0–15.0)
BUN: 13 mg/dL (ref 9.0–28.0)
CO2: 23 mEq/L (ref 22–29)
Calcium: 8.5 mg/dL (ref 8.5–10.5)
Chloride: 104 mEq/L (ref 100–111)
Creatinine: 0.9 mg/dL (ref 0.7–1.3)
Glucose: 102 mg/dL — ABNORMAL HIGH (ref 70–100)
Potassium: 3.3 mEq/L — ABNORMAL LOW (ref 3.5–5.1)
Sodium: 140 mEq/L (ref 136–145)

## 2020-10-02 LAB — VITAMIN B12: Vitamin B-12: 543 pg/mL (ref 211–911)

## 2020-10-02 LAB — FOLATE: Folate: 6.5 ng/mL

## 2020-10-02 LAB — SYPHILIS SCREEN IGG AND IGM: Syphilis Screen IgG and IgM: NONREACTIVE

## 2020-10-02 LAB — HEMOLYSIS INDEX: Hemolysis Index: 18 (ref 0–24)

## 2020-10-02 LAB — MAGNESIUM: Magnesium: 1.6 mg/dL (ref 1.6–2.6)

## 2020-10-02 LAB — GFR: EGFR: >60

## 2020-10-02 MED ORDER — AMLODIPINE BESYLATE 10 MG PO TABS
10.0000 mg | ORAL_TABLET | Freq: Every day | ORAL | 0 refills | Status: DC
Start: 2020-10-03 — End: 2020-10-20

## 2020-10-02 MED ORDER — MAGNESIUM OXIDE 400 MG TABS (WRAP)
400.0000 mg | ORAL_TABLET | Freq: Two times a day (BID) | ORAL | 0 refills | Status: DC
Start: 2020-10-02 — End: 2020-10-20

## 2020-10-02 MED ORDER — FOLIC ACID 1 MG PO TABS
1.0000 mg | ORAL_TABLET | Freq: Every day | ORAL | 0 refills | Status: AC
Start: 2020-10-03 — End: ?

## 2020-10-02 MED ORDER — PROPRANOLOL HCL 20 MG PO TABS
20.0000 mg | ORAL_TABLET | Freq: Three times a day (TID) | ORAL | 0 refills | Status: DC
Start: 2020-10-02 — End: 2020-10-20

## 2020-10-02 MED ORDER — AMLODIPINE BESYLATE 5 MG PO TABS
5.0000 mg | ORAL_TABLET | Freq: Once | ORAL | Status: AC
Start: 2020-10-02 — End: 2020-10-02
  Administered 2020-10-02: 13:00:00 5 mg via ORAL
  Filled 2020-10-02: qty 1

## 2020-10-02 MED ORDER — THIAMINE HCL 100 MG PO TABS
100.0000 mg | ORAL_TABLET | Freq: Every day | ORAL | 0 refills | Status: AC
Start: 2020-10-02 — End: ?

## 2020-10-02 MED ORDER — AMLODIPINE BESYLATE 5 MG PO TABS
10.0000 mg | ORAL_TABLET | Freq: Every day | ORAL | Status: DC
Start: 2020-10-03 — End: 2020-10-02

## 2020-10-02 MED ORDER — TAB-A-VITE/BETA CAROTENE PO TABS
1.0000 | ORAL_TABLET | Freq: Every day | ORAL | 0 refills | Status: DC
Start: 2020-10-03 — End: 2020-10-20

## 2020-10-02 NOTE — Progress Notes (Signed)
10:20 PM - Transport arrived to transfer pt to psych floor.     Per sitter, pt's been anxious and restless b/c pt believes the army and police is coming to take him. Pt refuses to go down to new room at the moment. States he needs more time to think and gather his belongings (clothing and cell-phone).     11:00 PM  Pt took scheduled po meds. Agreeable to going to other unit. RN, charge, security x2, and sitter transferred pt to Professional services building room 605 via wheelchair. Pt went with all personal belongings.

## 2020-10-02 NOTE — Plan of Care (Addendum)
Nursing Progress Note:     1:1 sitter at bedside for safety, Pt currently on TDO,security outside the pt room .     Pt has discharge order, will transfer to Psych floor  (605), Report given to RN Lisabeth Register  Iv was removed, catheter intact.    Neuro: Pt is A&Ox4, Pt noted hallucinating. Follows commands, able to make his needs known.  GIGU:  Pt is continent of bowel and bladder. Abd soft, non distended and active BS. Had BM this shift.   Cardiac: No tele, denies any chest pain this shift.  Pulm: Regular respirations, bilateral clear lungs sounds. Pt sating 97% in roomair.  Skin: redness to the chest, scattered scars to LUE, LLE, dryness to Left foot, edema to BLE.  Pain: no complain of pain this shift.  Mobility: Stand by assist with personal care.   Safety: Pt is a fall risk. Fall precautions in place: bed in lowest position, fall mat in place, bed alarm on, call light is within reach.    Active lines :   x1 piv, c/d/i          Problem: Compromised Tissue integrity  Goal: Damaged tissue is healing and protected  Outcome: Progressing  Goal: Nutritional status is improving  Outcome: Progressing     Problem: Moderate/High Fall Risk Score >5  Goal: Patient will remain free of falls  Outcome: Progressing  Flowsheets (Taken 09/30/2020 1530)  High (Greater than 13):   HIGH-Initiate use of floor mats as appropriate   HIGH-Apply yellow "Fall Risk" arm band   HIGH-Utilize chair pad alarm for patient while in the chair   HIGH-Bed alarm on at all times while patient in bed   HIGH-Visual cue at entrance to patient's room     Problem: Safety  Goal: Patient will be free from injury during hospitalization  Outcome: Progressing  Flowsheets (Taken 09/30/2020 1558)  Patient will be free from injury during hospitalization:   Assess patient's risk for falls and implement fall prevention plan of care per policy   Provide and maintain safe environment   Hourly rounding   Ensure appropriate safety devices are available at the  bedside   Use appropriate transfer methods     Problem: Pain  Goal: Pain at adequate level as identified by patient  Outcome: Progressing  Flowsheets (Taken 09/30/2020 1558)  Pain at adequate level as identified by patient:   Identify patient comfort function goal   Assess pain on admission, during daily assessment and/or before any "as needed" intervention(s)     Problem: Side Effects from Pain Analgesia  Goal: Patient will experience minimal side effects of analgesic therapy  Outcome: Progressing  Flowsheets (Taken 09/30/2020 1558)  Patient will experience minimal side effects of analgesic therapy:   Monitor/assess patient's respiratory status (RR depth, effort, breath sounds)   Assess for changes in cognitive function     Problem: Psychosocial and Spiritual Needs  Goal: Demonstrates ability to cope with hospitalization/illness  Outcome: Progressing  Flowsheets (Taken 09/30/2020 1558)  Demonstrates ability to cope with hospitalizations/illness:   Encourage verbalization of feelings/concerns/expectations   Provide quiet environment   Encourage patient to set small goals for self

## 2020-10-02 NOTE — Progress Notes (Signed)
PSYCHIATRIC CONSULTATION PROGRESS NOTE    Patient name:  Kristopher Rogers  Date of birth:  November 19, 1960  Age:  60 y.o.  MRN:  [id number]  CSN:  [id number]  Date of admission:  09/30/2020  Date of consultation:  10/02/2020  Attending/Referring physician:  Tamela Oddi, MD  Consulting Attending:  Armond Hang, MD  ==================================================  REASON FOR ADMISSION:  Alcohol withdrawal syndrome with complication [F10.239]    REASON FOR CONSULTATION:  Pt on TDO for possible SI    Interval History:     Overnight Events: no acute events reported. Pt declined scheduled ativan.    Patient seen in bed awake and calm.  He reported not taking Ativan last night because he said he took another medication shortly before that.  He reports only sleeping "a couple hours" and that he wanted to take Ativan or the other medication now because he feels like his mind is racing and he knows that he needs to get rest.  He denied feeling like he was going through alcohol withdrawal and continued to denies suicidal ideations.  He denies HI or hallucinations.  He did not report delusional content as prior however was not specifically asked as patient was requesting a medication to help him with his anxiety.       Medications:   Current medications and doses:    Current Facility-Administered Medications   Medication Dose Route Frequency Provider Last Rate Last Admin    acetaminophen (TYLENOL) tablet 650 mg  650 mg Oral Q6H PRN Volney Presser E, DO        Or    acetaminophen (TYLENOL) suppository 650 mg  650 mg Rectal Q6H PRN Joycelyn Das, DO        amLODIPine (NORVASC) tablet 5 mg  5 mg Oral Daily Borgschulte, Gitte, MD   5 mg at 10/02/20 0824    chlordiazePOXIDE (LIBRIUM) capsule 25 mg  25 mg Oral Q12H Volney Presser E, DO   25 mg at 10/01/20 1558    Followed by    Melene Muller ON 10/03/2020] chlordiazePOXIDE (LIBRIUM) capsule 25 mg  25 mg Oral Q12H [facility] Hedden, Morgan E, DO        dextrose (GLUCOSE) 40 % oral gel 15 g  of glucose  15 g of glucose Oral PRN Volney Presser E, DO        And    dextrose 50 % bolus 12.5 g  12.5 g Intravenous PRN Volney Presser E, DO        And    glucagon (rDNA) (GLUCAGEN) injection 1 mg  1 mg Intramuscular PRN Volney Presser E, DO        enoxaparin (LOVENOX) syringe 40 mg  40 mg Subcutaneous Daily Volney Presser E, DO   40 mg at 10/02/20 0825    folic acid (FOLVITE) tablet 1 mg  1 mg Oral Daily Hedden, Morgan E, DO   1 mg at 10/02/20 8295    hydrALAZINE (APRESOLINE) injection 10 mg  10 mg Intravenous Q6H PRN Borgschulte, Gitte, MD        lidocaine (LIDODERM) 5 % 1 patch  1 patch Transdermal Q24H PRN Volney Presser E, DO   1 patch at 09/30/20 2135    LORazepam (ATIVAN) tablet 1 mg  1 mg Oral Q4H PRN Borgschulte, Gitte, MD   1 mg at 10/02/20 0932    LORazepam (ATIVAN) tablet 2 mg  2 mg Oral QHS Borgschulte, Gitte, MD        magnesium oxide (MAG-OX) tablet 400 mg  400 mg Oral BID Borgschulte, Gitte, MD   400 mg at 10/02/20 0825    melatonin tablet 3 mg  3 mg Oral QHS PRN Volney Presser E, DO   3 mg at 09/30/20 2116    multivitamin tablet 1 tablet  1 tablet Oral Daily Joycelyn Das, DO   1 tablet at 10/02/20 0824    naloxone [facility]) injection 0.2 mg  0.2 mg Intravenous PRN Joycelyn Das, DO        propranolol (INDERAL) tablet 20 mg  20 mg Oral Q8H [facility] Borgschulte, Gitte, MD   20 mg at 10/02/20 0547    thiamine (B-1) 500 mg in sodium chloride 0.9 % 100 mL IVPB  500 mg Intravenous Q8H [facility] Hedden, Morgan E, DO 200 mL/hr at 10/02/20 0547 500 mg at 10/02/20 0547         EXAMINATION:  General Appearance:  60 y.o. Caucasian male long hair wearing hospital gown in NAD   Behavior:  Calm, Cooperative and Eye Contact is  Good   Speech:  Normal rate, normal volume, fluid, purposeful; interruptible with normal pitch   Mood:  " rev'ed up  "   Affect:  Anxious/Nervous and somewhat guarded   Thought Process:  Linear, goal directed and Coherent/logical   Thought content: No suicidal thoughts, No  homicidal thoughts, did not report any delusions    Perceptions:  No Visual/auditory/tactile/olfactory/gustatory/illusions      Orientation:  AAOx4      Attention and Concentration:  grossly intact   Memory:  Recent and remote intact    Language:  Intact     Fund of knowledge:  Intact   Insight:  Limited   Judgment:  Limited   Physical Exam:  Musculoskeletal:  Muscle bulk and tone are grossly intact; mild bilateral upper extremity tremor noted   Most recent physical exam as documented in the medical record has been reviewed.    Review of Systems:   ROS positive for:  A comprehensive review of systems was negative..   All other pertinent systems were reviewed and are negative unless mentioned in HPI.   Also reviewed medical ROS documented by primary team and other services.     Vital Signs:     Vitals:    10/02/20 0930   BP: (!) 151/99   Pulse: 72   Resp: 18   Temp: 97.8 F (36.6 C)   SpO2:         Laboratory Assessment:     Results     Procedure Component Value Units Date/Time    Folate [[id number]] Collected: 10/02/20 0421    Specimen: Blood Updated: 10/02/20 0638     Folate 6.5 ng/mL     Narrative:      Starting For 1 Occurences  QAMLAB X 3    Vitamin B12 [[id number]] Collected: 10/02/20 0421    Specimen: Blood Updated: 10/02/20 0631     Vitamin B-12 543 pg/mL     Narrative:      Starting For 1 Occurences  QAMLAB X 3    Syphilis Screen IgG and IgM [[id number]] Collected: 10/02/20 0421     Updated: 10/02/20 0622     Syphilis Screen IgG and IgM Nonreactive    Narrative:      Starting For 1 Occurences  QAMLAB X 3    Hemolysis index [[id number]] Collected: 10/02/20 0421     Updated: 10/02/20 0558     Hemolysis Index 18    Narrative:  Starting For 1 Occurences  QAMLAB X 3    GFR [[id number]] Collected: 10/02/20 0421     Updated: 10/02/20 0539     EGFR >60.0    Narrative:      Starting For 1 Occurences  QAMLAB X 3    Basic Metabolic Panel [[id number]]  (Abnormal) Collected: 10/02/20 0421    Specimen: Blood Updated:  10/02/20 0539     Glucose 102 mg/dL      BUN 30.8 mg/dL      Creatinine 0.9 mg/dL      Calcium 8.5 mg/dL      Sodium 657 mEq/L      Potassium 3.3 mEq/L      Chloride 104 mEq/L      CO2 23 mEq/L      Anion Gap 13.0    Narrative:      Starting For 1 Occurences  QAMLAB X 3    Phosphorus [[id number]] Collected: 10/02/20 0421    Specimen: Blood Updated: 10/02/20 0539     Phosphorus 2.8 mg/dL     Narrative:      Starting For 1 Occurences  QAMLAB X 3    Magnesium [[id number]] Collected: 10/02/20 0421    Specimen: Blood Updated: 10/02/20 0539     Magnesium 1.6 mg/dL     Narrative:      Starting For 1 Occurences  QAMLAB X 3    Hepatic function panel (LFT) [[id number]]  (Abnormal) Collected: 10/02/20 0421    Specimen: Blood Updated: 10/02/20 0539     Bilirubin, Total 0.5 mg/dL      Bilirubin Direct 0.3 mg/dL      Bilirubin Indirect 0.2 mg/dL      AST (SGOT) 17 U/L      ALT 12 U/L      Alkaline Phosphatase 60 U/L      Protein, Total 6.0 g/dL      Albumin 3.0 g/dL      Globulin 3.0 g/dL      Albumin/Globulin Ratio 1.0    Narrative:      Starting For 1 Occurences  QAMLAB X 3    CBC and differential [[id number]]  (Abnormal) Collected: 10/02/20 0421    Specimen: Blood Updated: 10/02/20 0511     WBC 5.38 x10 3/uL      Hgb 13.3 g/dL      Hematocrit 56.3 %      Platelets 212 x10 3/uL      RBC 3.98 x10 6/uL      MCV 99.0 fL      MCH 33.4 pg      MCHC 33.8 g/dL      RDW 12 %      MPV 9.8 fL      Neutrophils 59.1 %      Lymphocytes Automated 20.3 %      Monocytes 19.0 %      Eosinophils Automated 0.6 %      Basophils Automated 0.4 %      Immature Granulocytes 0.6 %      Nucleated RBC 0.0 /100 WBC      Neutrophils Absolute 3.19 x10 3/uL      Lymphocytes Absolute Automated 1.09 x10 3/uL      Monocytes Absolute Automated 1.02 x10 3/uL      Eosinophils Absolute Automated 0.03 x10 3/uL      Basophils Absolute Automated 0.02 x10 3/uL      Immature Granulocytes Absolute 0.03 x10 3/uL      Absolute NRBC 0.00 x10 3/uL  Narrative:      Starting  For 1 Occurences  QAMLAB X 3        DIAGNOSTIC STUDIES:  No results found for any visits on 09/30/20.    QTc: 477 ([place]) with a HR of 86 performed on 09/30/20    Assessment/Plan     DIAGNOSES:  1. Alcohol use disorder, severe c/b withdrawal and likely DTs  2. Mood disorder, unspecified (possible SSRI-induced mania - +family history of BPAD)  3. Anxiety disorder, unspecified   4. Encephalopathy/delirium due to general medical conditions - resolved    Pertinent Medical Diagnoses:    Prolonged QTc - resolved   AKI, elevated AG - resolved    HTN, gout      ASSESSMENT :  Bronsen Serano is a 60 y.o. with a medical history of HTN, gout, tremors and alcohol use disorder with otherwise no psychiatric history  who presented 09/30/2020 for complicated alcohol withdrawal and currently on TDO. Psychiatry CL service consulted for possible TDO and odd behaviors.    Patient was started on high dose thiamine for possible wernicke's (alcohol use and limited PO intake) and given fluids. His cognition appears to be clear, vitals have remained stable with the addition of antihypertensives given baseline HTN and QTc has normalized. He continues to have poor insight into his actions prior to admission as well as understanding for his current symptoms that he describes as racing thoughts, increased activity, decreased need for sleep and grandiosity. He remains guarded in discussion of symptoms although unclear if this is paranoia. He does have a family history of bipolar disorder in his mother making it possible for the zoloft he was started on a few weeks prior to admission to possibly trigger manic-like symptoms that were present on admission.     RECOMMENDATIONS:   1. Safety:  pt s history of alcohol use and recent erratic behavior; medical illnesses (with impairment/loss of functioning), and multiple social stressors puts them at increased risk for acute intentional harm.  While he denies active thoughts of self harm, he  presented with erratic behavior and highly delusional that has led him to have a psych TDO. Dispo pending hearing scheduled for 10/03/20 so patient is to remain on precautions until that time.     2. Unspecified mood disorder with grandiose and paranoid delusions on admission  - pt with family history of bipolar disorder and was more recently started on zoloft   - Recommendations    - QTc wnl, if not able to be transferred to inpatient unit would start seroquel 25mg  qhs and [place] standing nighttime ativan    3. Encephalopathy/delirium and alcohol use - no significant withdrawal in 24/h, finishing course of high dose thiamine   - labs wnl  - Recommendations              - CIWA with PRNs; has been declining librium taper   - high dose thiamine until transfer then can switch to 100mg  PO daily       Patient is informed of and states understanding of potential risks and benefits of proposed treatment plan.    Disposition:   Pending psych TDO hearing 10/03/20    Patient placed on wait list with RN supervisor ([id number]) for transfer pending bed availability and discussion with admitting attending  Will continue to provide updates when available     Psychiatry consult team will continue to follow the patient for this admission.    Thank you for allowing [place] to participate in  the care of your patient.    Signed by:  Armond Hang, MD  Consultation-Liaison Psychiatry  [phone] (8am - 4:30pm):  [phone]   Date/Time: 10/02/20 10:01 AM

## 2020-10-02 NOTE — Progress Notes (Addendum)
This Clinical research associate called charge nurse from the Psych floor at [phone] regarding discharge order. Charge nurse from the Psych floor said, room is not available at this time, she will call back once the room is ready. We'll continue to f/u.

## 2020-10-02 NOTE — OT Eval Note (Signed)
[facility]   Occupational Therapy Evaluation     Patient: Kristopher Rogers    MRN#: [id number]   Unit: [facility] [address]  Bed: F1024/F1024.01                                     Post Acute Care Therapy Recommendations:   Discharge Recommendations: Home with supervision     Milestones to be reached to achieve recommendation: none    DME Recommended for Discharge: Shower chair    Therapy discharge recommendations may change with patient status.  Please refer to most recent note for up-to-date recommendations.    Assessment:   Significant Findings: hallucinating - had sitter write down a phone number and a name on white board while having a conversation with hand to his ear - RN aware    Kristopher Rogers is a 60 y.o. male admitted 09/30/2020 with hallucinations presents with decreased strength, endurance and balance limiting I with self care and transfers. Pt requires sup to SBA with ADLs and SBA with transfers. At baseline, pt is I with no use of DME. Pt will benefit from con't OT to increase independence in ADLs, transfers, and safety.      Therapy Diagnosis: decreased ADLs    Rehabilitation Potential: good    Treatment Activities: OT eval  Educated the patient to role of occupational therapy, plan of care, goals of therapy and safety with mobility and ADLs.    Plan:   OT Frequency Recommended: 1-2x/wk     Treatment/Interventions: ADL training, functional transfer training, strengthening, safety training, pt education, there ex/ther act, energy conservation tech training, balance training    Risks/benefits/POC discussed with pt.       Precautions and Contraindications:   Falls     Consult received for Kristopher Rogers for OT Evaluation and Treatment.  Patients medical condition is appropriate for Occupational Therapy intervention at this time.      History of Present Illness:    Kristopher Rogers  is a 60 y.o. male admitted on 09/30/2020 with alcohol use, HTN, gout, and tremors who was  brought in by EMS/police after pt noted to be having hallucinations and concern that someone is out to hurt him. He does have significant h/o etoh use, last drink 1-3 days ago (pt has told different people different things). Pt does not think he is going through etoh withdrawal, he reports having issues with someone trying to harm him for the past weeks to months which he states is very real and he has reported it to the [employer] in the past. He denies any visual or auditory hallucinations. He does have a tremor which he reports has been present for several years. Per chart.    Admitting Diagnosis: Alcohol withdrawal syndrome with complication [F10.239]    Past Medical/Surgical History:  Past Medical History:   Diagnosis Date    Hypertension      History reviewed. No pertinent surgical history.    Imaging/Tests/Labs:  Lab Results   Component Value Date/Time    HGB 13.3 10/02/2020 04:21 AM    HCT 39.4 10/02/2020 04:21 AM    K 3.3 (L) 10/02/2020 04:21 AM    NA 140 10/02/2020 04:21 AM       Social History:   Prior Level of Function: independent  Assistive Devices: none  Baseline Activity: community  DME Currently at Home: none  Home Living Arrangements: alone  Type of Home: staying in a hotel  Home Layout: stairs    Subjective: "Just give me a second"    Patient is agreeable to participation in the therapy session. Nursing clears patient for therapy.     Patient Goal: get better  Pain:   Scale: reports no pain    Objective:   Patient is in bed with IV in place.  Pt wore mask during therapy session:No      Cognitive Status and Neuro Exam:  Alert, oriented x 3, follows commands    Musculoskeletal Examination  RUE ROM: WFL  LUE ROM: WFL    RUE Strength: WFL  LUE Strength: WFL    Sensory/Oculomotor Examination  Auditory: WFL  Tactile: LT intact UEs  Vision: tracking WFL      Activities of Daily Living  Eating: I hand to mouth  Grooming: seated, set up  Bathing: NT  UE Dressing: SBA  LE Dressing: SBA  Toileting: pt  declined    Functional Mobility:  Supine to Sit: sup  Sit to Stand: SBA  Transfers: SBA  Sit-supine: sup    PMP Activity: Step 6 - Walks in Room      Balance  Static Sitting: good  Dynamic Sitting: good  Static Standing: SBA  Dynamic Standing: SBA    Participation and Activity Tolerance  Participation Effort: good  Endurance: fair+    Patient left with call bell within reach, all needs met, SCDs off as found, fall mat in place, bed alarm on and all questions answered. RN notified of session outcome and patient response.       Goals:  Time For Goal Achievement: 3 visits  ADL Goals  Patient will dress lower body: Modified Independent  Patient will toilet: Supervision  Mobility and Transfer Goals  Pt will perform functional transfers: Supervision                           PPE worn during session: procedural mask, goggles and gloves    Tech present: no  PPE worn by tech: N/A         Time of treatment:   OT Received On: 10/02/20  Start Time: 1140  Stop Time: 1205  Time Calculation (min): 25 min    Signature: Felecia Shelling MS, OTR  Pager# [phone]

## 2020-10-02 NOTE — Discharge Summary (Addendum)
Hospitalist DISCHARGE SUMMARY  [facility]   Department of Medicine  24/7 on-call pager: [phone] (8-HOSP)   Main Office: [phone]   Main Fax: [phone]                Date of Admission: 09/30/2020  Date of Discharge: 10/02/2020        Date Time: 10/02/20 7:47 PM  Patient Name: Kristopher Rogers  Attending Physician: Lucilla Edin, MD  Primary Care Provider: No primary care provider on file.        Discharge Diagnoses:    Principal Diagnosis: Acute psychosis              Hospital Course        Reason for Admission/HPI:    Acute psychosis, alcohol withdrawal    Hospital Course:    Kristopher Rogers is a 60 year old male with no known psychiatric hx ( but does have a hx of ETOH abuse) who walked into a [facility] and said he wanted to kill himself with scissors or OTC pills.  He was also making other paranoid comments and with auditory and visual hallucinations.  The Police was called and he was place on an ECO later converted to TDO and brought to the ER here.     Upon presentation here he appeared anxious and claimed that he had not had a drink in 3 days. ETOH level was 36. His tox screen was neg ( a second tox screen was also done and was + for phenobarb but this was after he had received phenobarbital in the ER ).       Psych ( Dr. Darliss Ridgel)  consulted and did not want to admit him directly to Psych given than he had no psychiatric hx and the concern for alcohol withdrawal.  Vitals were stable with sious tachycardia. No fevers. Covid test neg. EKG with QTC 509.   Normal WBC, Cr 1.8 ( unknown baseline), k 3.1, Co2 16. TSH normal. UA neg. RPR neg, B12 ok.    He was given phenobarbital and later librium and ativan. He received fluids iv, thiamine and folate.     The patient did well on the floor without florid alcohol withdrawal symptoms.  He did however continue to have paranoid ideations.  He was hypertensive and had essential tremors which improved with a combination of Norvasc and  propranolol.    He remains on a TDO but is otherwise medically stable for transfer to inpatient psychiatry and a bed will be available this evening.  A discharge order has been placed with plans to transfer to inpatient psychiatry tonight.      Dx  # Acute psychosis -> suspect he has an undiagnosed underlying psychiatric disorder flare rather than ETOH withdrawal/DTs.  Schizophrenic, ? Bipolar,  questionable ETOH related/DTs.   # ETOH abuse  ( ? Drinks 3 drinks/day-> normal LFTS, alcohol level 36 on admit)  # Essential tremors -> doubt tremors from etoh withdrawal   # AKI- > likely from poor fluid intake from acute psychosis, unclear compliance with his losartan.  Improved   # Prolonged QTc 509   # HTN-> high this am. Losartan on hold for AKI  # Gout   # Hypomagnesemia                   Discharge Day ExamTemp:  [97 F (36.1 C)-98.4 F (36.9 C)] 97.7 F (36.5 C)  Heart Rate:  [60-85] 60  Resp Rate:  [18] 18  BP: (126-170)/(78-103)  153/90  General appearance: alert and oriented x3, no acute distress  HEENT: normocephalic atraumatic, PERRLA, EOMI, moist mucous membranes   Neck:  supple  Lungs:  clear to auscultation bilaterally, no wheezes, rubs, or rhonchi  Heart:   normal S1, S2, no murmurs rubs or gallops  Abdomen:  soft, nontender, nondistended, no rebound or guarding  Back:  no spinal tenderness  Extremities: no clubbing, cyanosis, or edema   Skin:  no rash  Neurologic:  normal mental status, CNs II-XII intact, 5/5 strength throughout, normal sensation      Consultations:  Treatment Team:   Attending Provider: Lucilla Edin, MD          RESULTS        Recent Lab Results:    Results     Procedure Component Value Units Date/Time    Folate [[id number]] Collected: 10/02/20 0421    Specimen: Blood Updated: 10/02/20 0638     Folate 6.5 ng/mL     Narrative:      Starting For 1 Occurences  QAMLAB X 3    Vitamin B12 [[id number]] Collected: 10/02/20 0421    Specimen: Blood Updated: 10/02/20 0631     Vitamin B-12 543 pg/mL      Narrative:      Starting For 1 Occurences  QAMLAB X 3    Syphilis Screen IgG and IgM [[id number]] Collected: 10/02/20 0421     Updated: 10/02/20 0622     Syphilis Screen IgG and IgM Nonreactive    Narrative:      Starting For 1 Occurences  QAMLAB X 3    Hemolysis index [[id number]] Collected: 10/02/20 0421     Updated: 10/02/20 0558     Hemolysis Index 18    Narrative:      Starting For 1 Occurences  QAMLAB X 3    GFR [[id number]] Collected: 10/02/20 0421     Updated: 10/02/20 0539     EGFR >60.0    Narrative:      Starting For 1 Occurences  QAMLAB X 3    Basic Metabolic Panel [[id number]]  (Abnormal) Collected: 10/02/20 0421    Specimen: Blood Updated: 10/02/20 0539     Glucose 102 mg/dL      BUN 53.6 mg/dL      Creatinine 0.9 mg/dL      Calcium 8.5 mg/dL      Sodium 644 mEq/L      Potassium 3.3 mEq/L      Chloride 104 mEq/L      CO2 23 mEq/L      Anion Gap 13.0    Narrative:      Starting For 1 Occurences  QAMLAB X 3    Phosphorus [[id number]] Collected: 10/02/20 0421    Specimen: Blood Updated: 10/02/20 0539     Phosphorus 2.8 mg/dL     Narrative:      Starting For 1 Occurences  QAMLAB X 3    Magnesium [[id number]] Collected: 10/02/20 0421    Specimen: Blood Updated: 10/02/20 0539     Magnesium 1.6 mg/dL     Narrative:      Starting For 1 Occurences  QAMLAB X 3    Hepatic function panel (LFT) [[id number]]  (Abnormal) Collected: 10/02/20 0421    Specimen: Blood Updated: 10/02/20 0539     Bilirubin, Total 0.5 mg/dL      Bilirubin Direct 0.3 mg/dL      Bilirubin Indirect 0.2 mg/dL      AST (SGOT) 17 U/L  ALT 12 U/L      Alkaline Phosphatase 60 U/L      Protein, Total 6.0 g/dL      Albumin 3.0 g/dL      Globulin 3.0 g/dL      Albumin/Globulin Ratio 1.0    Narrative:      Starting For 1 Occurences  QAMLAB X 3    CBC and differential [[id number]]  (Abnormal) Collected: 10/02/20 0421    Specimen: Blood Updated: 10/02/20 0511     WBC 5.38 x10 3/uL      Hgb 13.3 g/dL      Hematocrit 40.9 %      Platelets 212 x10 3/uL       RBC 3.98 x10 6/uL      MCV 99.0 fL      MCH 33.4 pg      MCHC 33.8 g/dL      RDW 12 %      MPV 9.8 fL      Neutrophils 59.1 %      Lymphocytes Automated 20.3 %      Monocytes 19.0 %      Eosinophils Automated 0.6 %      Basophils Automated 0.4 %      Immature Granulocytes 0.6 %      Nucleated RBC 0.0 /100 WBC      Neutrophils Absolute 3.19 x10 3/uL      Lymphocytes Absolute Automated 1.09 x10 3/uL      Monocytes Absolute Automated 1.02 x10 3/uL      Eosinophils Absolute Automated 0.03 x10 3/uL      Basophils Absolute Automated 0.02 x10 3/uL      Immature Granulocytes Absolute 0.03 x10 3/uL      Absolute NRBC 0.00 x10 3/uL     Narrative:      Starting For 1 Occurences  QAMLAB X 3    GFR [[id number]] Collected: 10/01/20 0101     Updated: 10/01/20 0159     EGFR >60.0    Narrative:      Starting For 1 Occurences  QAMLAB X 3    Basic Metabolic Panel [[id number]]  (Abnormal) Collected: 10/01/20 0101    Specimen: Blood Updated: 10/01/20 0159     Glucose 88 mg/dL      BUN 57.8 mg/dL      Creatinine 1.1 mg/dL      Calcium 8.6 mg/dL      Sodium 469 mEq/L      Potassium 3.5 mEq/L      Chloride 105 mEq/L      CO2 21 mEq/L      Anion Gap 13.0    Narrative:      Starting For 1 Occurences  QAMLAB X 3    Phosphorus [[id number]] Collected: 10/01/20 0101    Specimen: Blood Updated: 10/01/20 0159     Phosphorus 3.1 mg/dL     Narrative:      Starting For 1 Occurences  QAMLAB X 3    Magnesium [[id number]] Collected: 10/01/20 0101    Specimen: Blood Updated: 10/01/20 0159     Magnesium 1.7 mg/dL     Narrative:      Starting For 1 Occurences  QAMLAB X 3    CBC and differential [[id number]]  (Abnormal) Collected: 10/01/20 0101    Specimen: Blood Updated: 10/01/20 0133     WBC 4.54 x10 3/uL      Hgb 13.3 g/dL      Hematocrit 74.2 %      Platelets 183 x10  3/uL      RBC 3.96 x10 6/uL      MCV 99.0 fL      MCH 33.6 pg      MCHC 33.9 g/dL      RDW 12 %      MPV 9.5 fL      Neutrophils 63.3 %      Lymphocytes Automated 19.6 %      Monocytes 15.2 %       Eosinophils Automated 1.1 %      Basophils Automated 0.4 %      Immature Granulocytes 0.4 %      Nucleated RBC 0.0 /100 WBC      Neutrophils Absolute 2.87 x10 3/uL      Lymphocytes Absolute Automated 0.89 x10 3/uL      Monocytes Absolute Automated 0.69 x10 3/uL      Eosinophils Absolute Automated 0.05 x10 3/uL      Basophils Absolute Automated 0.02 x10 3/uL      Immature Granulocytes Absolute 0.02 x10 3/uL      Absolute NRBC 0.00 x10 3/uL     Narrative:      Starting For 1 Occurences  QAMLAB X 3    Rapid drug screen, urine (If not already done) [[id number]]  (Abnormal) Collected: 09/30/20 2223    Specimen: Urine Updated: 09/30/20 2256     Urine Amphetamine Screen Negative     Barbiturate Screen, UR Positive     Benzodiazepine Screen, UR Negative     Cannabinoid Screen, UR Negative     Cocaine, UR Negative     Opiate Screen, UR Negative     PCP Screen, UR Negative    Narrative:      Drug Screen, Urine Random    Osmolality [[id number]] Collected: 09/30/20 0852    Specimen: Blood Updated: 09/30/20 1000     Osmolality 294 mosm/kg     Basic Metabolic Panel [[id number]]  (Abnormal) Collected: 09/30/20 0852    Specimen: Blood Updated: 09/30/20 0937     Glucose 97 mg/dL      BUN 24.4 mg/dL      Creatinine 1.4 mg/dL      Calcium 8.0 mg/dL      Sodium 010 mEq/L      Potassium 3.9 mEq/L      Chloride 105 mEq/L      CO2 17 mEq/L      Anion Gap 14.0    GFR [[id number]] Collected: 09/30/20 0852     Updated: 09/30/20 0937     EGFR 51.8    Lactic Acid [[id number]] Collected: 09/30/20 0617    Specimen: Blood Updated: 09/30/20 0626     Lactic Acid 1.7 mmol/L     Rapid drug screen, urine [[id number]] Collected: 09/30/20 0551    Specimen: Urine Updated: 09/30/20 2951     Urine Amphetamine Screen Negative     Barbiturate Screen, UR Negative     Benzodiazepine Screen, UR Negative     Cannabinoid Screen, UR Negative     Cocaine, UR Negative     Opiate Screen, UR Negative     PCP Screen, UR Negative    UA Reflex to Micro - Reflex to Culture  [[id number]]  (Abnormal) Collected: 09/30/20 0551    Specimen: Urine, Clean Catch Updated: 09/30/20 0160     Urine Type Urine, Clean Ca     Color, UA Yellow     Clarity, UA Clear     Specific Gravity UA 1.014     Urine pH  6.0     Leukocyte Esterase, UA Negative     Nitrite, UA Negative     Protein, UR 30     Glucose, UA 50     Ketones UA Negative     Urobilinogen, UA Normal mg/dL      Bilirubin, UA Negative     Blood, UA Moderate     RBC, UA 0 - 2 /hpf      WBC, UA 0 - 5 /hpf      Squamous Epithelial Cells, Urine 0 - 5 /hpf      Hyaline Casts, UA 11 - 25 /lpf     TSH [[id number]] Collected: 09/30/20 0503    Specimen: Blood Updated: 09/30/20 0555     TSH 1.02 uIU/mL     Salicylate Level [[id number]]  (Abnormal) Collected: 09/30/20 0503    Specimen: Blood Updated: 09/30/20 0541     Salicylate Level <5.0 mg/dL     Magnesium [[id number]] Collected: 09/30/20 0503    Specimen: Blood Updated: 09/30/20 0541     Magnesium 1.6 mg/dL     GFR [[id number]] Collected: 09/30/20 0503     Updated: 09/30/20 0541     EGFR 38.8    Comprehensive metabolic panel [[id number]]  (Abnormal) Collected: 09/30/20 0503    Specimen: Blood Updated: 09/30/20 0541     Glucose 119 mg/dL      BUN 24.4 mg/dL      Creatinine 1.8 mg/dL      Sodium 010 mEq/L      Potassium 3.1 mEq/L      Chloride 98 mEq/L      CO2 16 mEq/L      Calcium 9.1 mg/dL      Protein, Total 7.3 g/dL      Albumin 3.8 g/dL      AST (SGOT) 34 U/L      ALT 17 U/L      Alkaline Phosphatase 76 U/L      Bilirubin, Total 0.4 mg/dL      Globulin 3.5 g/dL      Albumin/Globulin Ratio 1.1     Anion Gap 21.0    Ethanol (Alcohol) Level [[id number]]  (Abnormal) Collected: 09/30/20 0503    Specimen: Blood Updated: 09/30/20 0541     Alcohol 36 mg/dL     Acetaminophen Level [[id number]]  (Abnormal) Collected: 09/30/20 0503    Specimen: Blood Updated: 09/30/20 0541     Acetaminophen Level <7 ug/mL     CBC and differential [[id number]]  (Abnormal) Collected: 09/30/20 0503    Specimen: Blood Updated: 09/30/20  0533     WBC 7.60 x10 3/uL      Hgb 14.1 g/dL      Hematocrit 29.5 %      Platelets 246 x10 3/uL      RBC 4.18 x10 6/uL      MCV 98.6 fL      MCH 33.7 pg      MCHC 34.2 g/dL      RDW 12 %      MPV 9.3 fL      Neutrophils 72.8 %      Lymphocytes Automated 10.7 %      Monocytes 15.4 %      Eosinophils Automated 0.1 %      Basophils Automated 0.5 %      Immature Granulocytes 0.5 %      Nucleated RBC 0.0 /100 WBC      Neutrophils Absolute 5.53 x10 3/uL      Lymphocytes Absolute  Automated 0.81 x10 3/uL      Monocytes Absolute Automated 1.17 x10 3/uL      Eosinophils Absolute Automated 0.01 x10 3/uL      Basophils Absolute Automated 0.04 x10 3/uL      Immature Granulocytes Absolute 0.04 x10 3/uL      Absolute NRBC 0.00 x10 3/uL     COVID-19 (SARS-COV-2) (ID Now)- Behavioral health admission (no isolation) [[id number]] Collected: 09/30/20 0503    Specimen: Nasopharyngeal Swab from Nasopharynx Updated: 09/30/20 0532     Purpose of COVID testing Screening     SARS-CoV-2 Specimen Source Nasopharyngeal     SARS CoV 2 Overall Result Negative    Narrative:      o Collect and clearly label specimen type:  o Upper respiratory specimen: One Nasopharyngeal Dry Swab NO  Transport Media.  o Hand deliver to laboratory ASAP  Indication for testing->Behavioral health admission  Screening            Radiology Results:    No results found.            DISCHARGE MEDICATIONS:       Discharge Medication List      Taking    amLODIPine 10 MG tablet  Dose: 10 mg  Commonly known as: NORVASC  Start taking on: October 03, 2020  Take 1 tablet (10 mg total) by mouth daily     folic acid 1 MG tablet  Dose: 1 mg  Commonly known as: FOLVITE  Start taking on: October 03, 2020  Take 1 tablet (1 mg total) by mouth daily     magnesium oxide 400 MG tablet  Dose: 400 mg  Commonly known as: MAG-OX  Take 1 tablet (400 mg total) by mouth 2 (two) times daily     multivitamin Tabs  Dose: 1 tablet  Start taking on: October 03, 2020  Take 1 tablet by mouth daily      propranolol 20 MG tablet  Dose: 20 mg  Commonly known as: INDERAL  Take 1 tablet (20 mg total) by mouth 3 (three) times daily     thiamine 100 MG tablet  Dose: 100 mg  Commonly known as: B-1  Take 1 tablet (100 mg total) by mouth daily        STOP taking these medications    losartan 50 MG tablet  Commonly known as: COZAAR     sertraline 100 MG tablet  Commonly known as: ZOLOFT              Allergies:    Allergies   Allergen Reactions    Penicillins Rash             DISCHARGE INSTRUCTIONS        Disposition: Inpatient psych       Complete instructions and follow up are in the patient's After Visit Summary    Follow-up Plan:                Immunizations Provided:    There is no immunization history on file for this patient.      Minutes spent coordinating discharge and reviewing discharge plan: 40 minutes    Signed by: Tamela Oddi, MD, MD    Communication with PCP:   [facility] PCP-EP[facility] Inbasket message sent to update    CC: No primary care provider on file.

## 2020-10-02 NOTE — Plan of Care (Signed)
PSYCHIATRY PLAN OF CARE NOTE    Patient accepted by Dr. Merlinda Frederick to inpatient psych once bed becomes available this evening   Admitting diagnoses: Bipolar disorder, manic, with psychotic features    RN supervisor updated [phone])    Signed by:  Armond Hang, MD  Consultation-Liaison Psychiatry  [phone] (8am - 4:30pm):  [phone]   Date/Time: 10/02/20 2:10 PM

## 2020-10-03 ENCOUNTER — Inpatient Hospital Stay: Payer: PRIVATE HEALTH INSURANCE

## 2020-10-03 DIAGNOSIS — R9431 Abnormal electrocardiogram [ECG] [EKG]: Secondary | ICD-10-CM

## 2020-10-03 DIAGNOSIS — T1491XA Suicide attempt, initial encounter: Secondary | ICD-10-CM | POA: Diagnosis present

## 2020-10-03 DIAGNOSIS — F29 Unspecified psychosis not due to a substance or known physiological condition: Secondary | ICD-10-CM

## 2020-10-03 DIAGNOSIS — F1020 Alcohol dependence, uncomplicated: Secondary | ICD-10-CM

## 2020-10-03 LAB — ECG 12-LEAD
Atrial Rate: 91 {beats}/min
P Axis: 55 degrees
P-R Interval: 158 ms
Q-T Interval: 388 ms
QRS Duration: 68 ms
QTC Calculation (Bezet): 477 ms
R Axis: -23 degrees
T Axis: 41 degrees
Ventricular Rate: 91 {beats}/min

## 2020-10-03 MED ORDER — CHLORDIAZEPOXIDE HCL 5 MG PO CAPS
25.0000 mg | ORAL_CAPSULE | Freq: Two times a day (BID) | ORAL | Status: DC
Start: 2020-10-03 — End: 2020-10-04
  Administered 2020-10-03 (×2): 25 mg via ORAL
  Filled 2020-10-03: qty 1

## 2020-10-03 MED ORDER — ACETAMINOPHEN 325 MG PO TABS
650.0000 mg | ORAL_TABLET | Freq: Four times a day (QID) | ORAL | Status: DC | PRN
Start: 2020-10-03 — End: 2020-10-21
  Administered 2020-10-03 – 2020-10-08 (×2): 650 mg via ORAL
  Filled 2020-10-03 (×3): qty 2

## 2020-10-03 MED ORDER — COLCHICINE 0.6 MG PO TABS
1.2000 mg | ORAL_TABLET | Freq: Once | ORAL | Status: AC
Start: 2020-10-03 — End: 2020-10-03
  Administered 2020-10-03: 17:00:00 1.2 mg via ORAL
  Filled 2020-10-03: qty 2

## 2020-10-03 MED ORDER — IBUPROFEN 600 MG PO TABS
600.0000 mg | ORAL_TABLET | Freq: Four times a day (QID) | ORAL | Status: DC | PRN
Start: 2020-10-03 — End: 2020-10-03
  Administered 2020-10-03: 17:00:00 600 mg via ORAL

## 2020-10-03 MED ORDER — LORAZEPAM 1 MG PO TABS
1.0000 mg | ORAL_TABLET | ORAL | Status: DC | PRN
Start: 2020-10-03 — End: 2020-10-11
  Administered 2020-10-08 – 2020-10-10 (×4): 1 mg via ORAL
  Filled 2020-10-03 (×4): qty 1

## 2020-10-03 MED ORDER — LIDOCAINE 5 % EX PTCH
1.0000 | MEDICATED_PATCH | CUTANEOUS | Status: DC | PRN
Start: 2020-10-03 — End: 2020-10-21

## 2020-10-03 MED ORDER — ACETAMINOPHEN 650 MG RE SUPP
650.0000 mg | Freq: Four times a day (QID) | RECTAL | Status: DC | PRN
Start: 2020-10-03 — End: 2020-10-21
  Filled 2020-10-03 (×4): qty 1

## 2020-10-03 MED ORDER — THIAMINE (VITAMIN B1) 100 MG PO TABS (WRAP)
100.0000 mg | ORAL_TABLET | Freq: Every day | ORAL | Status: DC
Start: 2020-10-03 — End: 2020-10-21
  Administered 2020-10-03 – 2020-10-20 (×18): 100 mg via ORAL
  Filled 2020-10-03 (×18): qty 1

## 2020-10-03 MED ORDER — IBUPROFEN 600 MG PO TABS
600.0000 mg | ORAL_TABLET | Freq: Four times a day (QID) | ORAL | Status: DC | PRN
Start: 2020-10-03 — End: 2020-10-06
  Administered 2020-10-04 – 2020-10-05 (×3): 600 mg via ORAL
  Filled 2020-10-03: qty 1

## 2020-10-03 MED ORDER — HALOPERIDOL 5 MG PO TABS
5.0000 mg | ORAL_TABLET | ORAL | Status: DC | PRN
Start: 2020-10-03 — End: 2020-10-21
  Administered 2020-10-03 – 2020-10-16 (×6): 5 mg via ORAL
  Filled 2020-10-03 (×6): qty 1

## 2020-10-03 MED ORDER — COLCHICINE 0.6 MG PO TABS
0.6000 mg | ORAL_TABLET | Freq: Once | ORAL | Status: DC
Start: 2020-10-03 — End: 2020-10-04
  Filled 2020-10-03: qty 1

## 2020-10-03 MED ORDER — HYDRALAZINE HCL 20 MG/ML IJ SOLN
10.0000 mg | Freq: Four times a day (QID) | INTRAMUSCULAR | Status: DC | PRN
Start: 2020-10-03 — End: 2020-10-21
  Filled 2020-10-03 (×4): qty 1

## 2020-10-03 MED ORDER — COLCHICINE 0.6 MG PO TABS
0.6000 mg | ORAL_TABLET | Freq: Every day | ORAL | Status: DC
Start: 2020-10-04 — End: 2020-10-06
  Administered 2020-10-04 – 2020-10-06 (×3): 0.6 mg via ORAL
  Filled 2020-10-03 (×4): qty 1

## 2020-10-03 MED ORDER — PROPRANOLOL HCL 10 MG PO TABS
20.0000 mg | ORAL_TABLET | Freq: Three times a day (TID) | ORAL | Status: DC
Start: 2020-10-03 — End: 2020-10-21
  Administered 2020-10-03 – 2020-10-20 (×49): 20 mg via ORAL
  Filled 2020-10-03 (×46): qty 2

## 2020-10-03 MED ORDER — TRAZODONE HCL 50 MG PO TABS
50.0000 mg | ORAL_TABLET | Freq: Every evening | ORAL | Status: DC | PRN
Start: 2020-10-03 — End: 2020-10-03
  Administered 2020-10-03: 50 mg via ORAL
  Filled 2020-10-03: qty 1

## 2020-10-03 MED ORDER — COLCHICINE 0.6 MG PO TABS
0.6000 mg | ORAL_TABLET | Freq: Once | ORAL | Status: DC
Start: 2020-10-03 — End: 2020-10-03
  Filled 2020-10-03: qty 1

## 2020-10-03 MED ORDER — OLANZAPINE 5 MG PO TABS
5.0000 mg | ORAL_TABLET | Freq: Every evening | ORAL | Status: DC
Start: 2020-10-03 — End: 2020-10-04
  Administered 2020-10-03: 21:00:00 5 mg via ORAL

## 2020-10-03 MED ORDER — TAB-A-VITE/BETA CAROTENE PO TABS
1.0000 | ORAL_TABLET | Freq: Every day | ORAL | Status: DC
Start: 2020-10-03 — End: 2020-10-21
  Administered 2020-10-03 – 2020-10-20 (×18): 1 via ORAL
  Filled 2020-10-03 (×18): qty 1

## 2020-10-03 MED ORDER — AMLODIPINE BESYLATE 5 MG PO TABS
10.0000 mg | ORAL_TABLET | Freq: Every day | ORAL | Status: DC
Start: 2020-10-03 — End: 2020-10-21
  Administered 2020-10-03 – 2020-10-20 (×17): 10 mg via ORAL
  Filled 2020-10-03 (×18): qty 2

## 2020-10-03 MED ORDER — FOLIC ACID 1 MG PO TABS
1.0000 mg | ORAL_TABLET | Freq: Every day | ORAL | Status: DC
Start: 2020-10-03 — End: 2020-10-21
  Administered 2020-10-03 – 2020-10-20 (×17): 1 mg via ORAL
  Filled 2020-10-03 (×18): qty 1

## 2020-10-03 NOTE — UM Notes (Addendum)
10/03/2020 9:49 am Initial review for admission date of 10/03/2020 at 0001    Legal Status: TDO    10/03/2020 addendum per secure text MD Dr. Delbert Phenix: just added Bipolar I disorder, most recent episode manic, severe with psychotic features      10/03/2020 per psych nurses admit note:   Situation:  (Reason for Admission, legal status, Patient's goals for inpatient treatment)    Kristopher Rogers was placed on a TDO status because he verbalized that he wanted to die by stabbing himself with a scissor or overdose on his pills [facility].  Upon face to face - Kristopher Rogers was asked what was his goals for inpatient treatment and he states "I don't know."        Patient's expectations / goals for inpatient treatment:  Long term goal: "To feel better I guess"  Short term goal(s):No response    Background:  (Presentation, appearance, symptoms, mental status, behavior, pertinent historical data including BH treatment, substance use, functional status)      Upon face to face - Kristopher Rogers has a depressed affect.  He is intermittently confused, paranoid, guarded and dismissive with his answers.  Kristopher Rogers denies alcohol, drugs and smoking even if he has a long history of alcohol use and tested positive on barbituates.  He denies history of behavioral health treatments and states that he last time he was in a hospital was "many years ago" when he had a compound fracture of his left arm r/t injury. Kristopher Rogers is acting bizarre -  He was found in the shower standing and starring at the wall - when asked what he was doing he states "I'm in a deep prayer." He was told that whenever staff calls him - he needs to answer to make sure that he is okay and safe.    10/03/20 09:42:32   101Abnormal  127/80 99 %     10/03/20 07:50:17 98.2 F (36.8 C)  99  125/77 97 %     10/03/20 06:45:37 98.2 F (36.8 C)  78  142/85 96 %       Current known Allergies:  Penicillins    10/03/2020 Presenting mental Status:   Orientation Level Disoriented to  situation;Disoriented to time   Memory Unable to assess   Thought Content delusions;paranoia   Thought Process blocking;concrete   Behavior restless;bizarre   Consciousness Alert   Impulse Control impaired   Perception WDL   Eye Contact    Attitude suspicious;guarded   Mood unstable;depressed;anxious   Hopelessness Affects Goals    Hopelessness About Future    Affect flat   Speech pressured   Concentration impaired   Insight poor   Judgment poor   Appearance unkempt   Appetite    Weight change?    Energy    Sleep    Reliability of Reporter/Patient        10/02/2020 per MD [place] summary: Dr. Fayne Norrie, MD:  Discharge Diagnoses: Principal Diagnosis: Acute psychosis   Kristopher Rogers is a 60 year old male withno known psychiatric hx ( but does have a hx of ETOH abuse) who [facility] and said he wanted to kill himself with scissors or OTC pills. He was also making other paranoid comments and with auditory and visual hallucinations. The Police was called and he was place on an ECO later converted to TDO and brought to the ER here.     Upon presentation here he appeared anxious and claimed that he had not had  a drink in 3 days. ETOH level was 36. His tox screen was neg ( a second tox screen was also done and was + for phenobarb but this was after he had received phenobarbital in the ER ).       Psych ( Dr. Darliss Ridgel) consulted and did not want to admit him directly to Psych given than he had no psychiatric hx and the concern for alcohol withdrawal. Vitals were stable with sious tachycardia. No fevers. Covid test neg. EKG with QTC 509.   Normal WBC, Cr 1.8 ( unknown baseline), k 3.1, Co2 16. TSH normal. UA neg. RPR neg, B12 ok.    He was given phenobarbital and later librium and ativan. He received fluids iv, thiamine and folate.     The patient did well on the floor without florid alcohol withdrawal symptoms.  He did however continue to have paranoid ideations.  He was hypertensive and had essential  tremors which improved with a combination of Norvasc and propranolol.    He remains on a TDO but is otherwise medically stable for transfer to inpatient psychiatry     Medications psych scheduled:  Librium 25 mg po q 12 hr scheduled first dose after last modification 10/03/2020 at 0445 for 4 doses    Prn meds  Regular diet; safety tray all meals  Labs  CIWA scale (see MD orders)  Close q 15 min checks  V/s bid  Fall precautions  Seizure precautions   Suicide precautions   Therapeutic recreation eval and treat   OT eval and treat    Review [facility]  TDO  No insurance listed at this writing  Iantha Fallen, RN,BSN  [facility] Case Manager, Psychiatry  [employer]  [phone] Phone  [phone] Fax UR Dept
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
## 2020-10-03 NOTE — Progress Notes (Signed)
Checklist instructions: Score the patient once a shift and as needed. Absence of a behavior results in a score of 0. Presence or increase in the display of a behavior results in a score of 1. Maximum score (SUM) possible is 6. If a behavior is at baseline for a patient, e.g. the patient is normally confused, this will result in a score of 0. An increase in confusion will result in a score of 1.      [name] Kristopher Rogers is being assessed under the Broset Violence Checklist for any escalations in potentially violent or harmful behavior.     Date of Admission:                                                                         Date of Assessment: 10/03/20   Assessment Time 0900      Confused 0      Irritable 0      Boisterous 0      Verbal Threats 0      Physical Threats 0      Attacking Objects 0      SUM 0          TW attempted the following interventions based on the patient's sum total score assessed on the BVC: 1= Monitoring.      Should the patient require multiple interventions or fails to engage in de-escalation with staff, please review the plan for managing the care of an escalated patient.  Please do the following:  1.  Immediately notify Security and Nursing Administrative Supervisor/Director to respond  2.  Conduct team huddle to rule out clinical causes for escalated behavior  3.  Review the note authored by the Associate Professor for additional information  4.  Document specific behaviors observed  5.  Consider SAFE Team call for additional resources              The Broset Violence Checklist (BVC) is a 6 item inventory that was designed to assist in the prediction of imminent violent behavior (24 hours perspective) in healthcare and other sectors where workplace violence is acknowledged as a serious problem.   The BVC does not give instructions on what to do or what interventions to provide when a violent episode occurs; it is a took meant to aid in the risk assessment process. Interventions are based on  the individual, the environment, and cultural sensitivity indicators.
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
## 2020-10-03 NOTE — Progress Notes (Addendum)
Gout flare    XR r.hand negative for fracture. Advised patient that dorsal right index & 3rd MCP joint swelling & pain c/w an acute gout flare. Per patient, the swelling only started today. Ordered Colchicine 1.2 mg PO now and 0.6mg  1 hr later. Daily Colchicine 0.6mg  until resolution. Ibuprofen 600 mg PO Q6hrs PRN. D/w IM and will consult team if s/s persist. Will re-evaluate.      Signed by: Dionicia Abler, FNP-BC

## 2020-10-03 NOTE — Plan of Care (Signed)
Problem: Addiction to alcohol or opioids or other substances AS EVIDENCED BY:  Goal: Will identify long and short term goals based on individual needs and strengths  Outcome: Not Progressing  Goal: Patient achieves a safe detoxification and management of withdrawal symptoms  Outcome: Not Progressing  Goal: Patient educated about the disease of addiction and recovery and identifies long-term goal  Outcome: Not Progressing  Goal: Patient develops a discharge plan  Outcome: Not Progressing     Problem: At Risk for Harm to Self: AS EVIDENCED BY...  Goal: Will identify long and short term goals based on individual needs and strengths  Outcome: Not Progressing  Goal: Reduction of self-harm thoughts and no attempts  Outcome: Not Progressing  Goal: Identifies stressors, protective factors, and coping strategies  Outcome: Not Progressing  Goal: Verbalizes understanding of medication, benefits, and side effects  Outcome: Not Progressing  Goal: Identify and participate in supportive program therapies  Outcome: Not Progressing  Goal: Completes discharge safety and recovery plan  Outcome: Not Progressing

## 2020-10-03 NOTE — Plan of Care (Signed)
Problem: Addiction to alcohol or opioids or other substances AS EVIDENCED BY:  Goal: Will identify long and short term goals based on individual needs and strengths  Outcome: Progressing  Goal: Patient achieves a safe detoxification and management of withdrawal symptoms  Outcome: Progressing   Patient has been mostly isolative to his room and is cooperative with all care including medication management. Patient denies SI/HI/AVH   at this time. Patient is on CIWA Q 2 hours ,1, 0.  Torben states he has not had any ETOH for over a week. Patient is polite with disorganized speech upon approach. Domingos reports eating normally and he slept for 3 hours last night. Will continue to monitor for patient safety.

## 2020-10-03 NOTE — Psych Admission Note (Signed)
Nurse SBAR Admission Note:    Situation:  (Reason for Admission, legal status, Patient's goals for inpatient treatment)    [name] was placed on a TDO status because he verbalized that he wanted to die by stabbing himself with a scissor or overdose on his pills when [facility].  Upon face to face - [name] was asked what was his goals for inpatient treatment and he states "I don't know."        Patient's expectations / goals for inpatient treatment:  Long term goal: "To feel better I guess"  Short term goal(s):No response    Background:  (Presentation, appearance, symptoms, mental status, behavior, pertinent historical data including BH treatment, substance use, functional status)      Upon face to face - [name] has a depressed affect.  He is intermittently confused, paranoid, guarded and dismissive with his answers.  [name] denies alcohol, drugs and smoking even if he has a long history of alcohol use and tested positive on barbituates.  He denies history of behavioral health treatments and states that he last time he was in a hospital was "many years ago" when he had a compound fracture of his left arm r/t injury. [name] is acting bizarre -  He was found in the shower standing and starring at the wall - when asked what he was doing he states "I'm in a deep prayer." He was told that whenever staff calls him - he needs to answer to make sure that he is okay and safe.    Current V/S:  There were no vitals filed for this visit.  Current known Allergies:  Penicillins    Assessment: (Immediate Safety Concerns; SI, HI, medical issues)    [name] denies suicidal and homicidal ideations at this time. When he was asked why he said that he wanted to [facility] - states "I don't know."  Denies auditory and visual hallucinations.  Reports history of gout on bilateral feet, right index finger pain r/t injury and hypertension.      Current suicide risk (admission C-SSR-S screen and SAFE-T assessment):      1. Have you  wished you were dead or could go to sleep and not wake up?  No   2. Have you had any thoughts of killing yourself?      No        If YES to 2, ask questons 3,4,5, and 6.  If NO to 2, do directly to question 6    3. Have you been thinking about how you might do this? No                            4. Have you had these thoughts and had some intention of acting on them? No   5. Have you started to work out or worked out the details of how to         kill yourself?No Do you intend to carry out this plan?  No                                                     6. Have you ever done anything , started to do anything, or prepared to do        anything to end you life?  No                                                                                    If YES, ask: Was this in the past three months?   No                                Describe                                                  Specific Questioning About Thoughts, Plans, and Suicidal Intent  How many times have you had these thoughts? (Past Month): Does not apply  When you have the thoughts how long do they last? (Past Month): Does not apply  Could/can you stop thinking about killing yourself or wanting to die if you want to? (Past Month): Does not attempt to control thoughts  Are there things - anyone or anything (e.g. family, religion, pain of death) - that stopped you from wanting to die or acting on thoughts of suicide? (Past Month): Does not apply  What sort of reasons did you have for thinking about wanting to die or killing yourself?  Was it to end the pain or stop the way you were feeling (in other words you couldnt go on living with this pain or how you were feeling) or was it to get attention: Does not apply  Total Score of Intensity of Ideation: 0   Step 2: Identify Risk Factors  Clinical Status (Current/Recent): Agitation or severe anxiety,Psychosis  Clinical Status (Lifetime): Substance/Alcohol Abuse or Dependence  Precipitants/Stressors:  Substance intoxication or withdrawal  Treatment History / Dx: Patient denies  Access to lethal methods: No, does not have access to lethal methods  Step 3: Identify Protective Factors  Internal: Identifies reasons for living  External: Patient denies  Other protective factors: n/a  Step 4: Guidelines to Determine Level of Risk  Suicide Risk Level: Moderate  Step 5: Possible Interventions to LOWER Risk Level  Behavioral Health Ambulatory, or Emergency Department: Initiate inpatient admission process  Behavioral Health Inpatient Unit: L/M/H - Q 15 min in person checks  Step 6: Documentation  Summary of Evaluation: [name] is a 60 yo caucasian male from the medical building.  From report - [facility] when he verbalized that he wanted to die or overdose on pills.  [name] was reported to have a long history of alcohol abuse but during face to face he denies alcohol, drug and substance abuse.  Reports hx of gout and hypertension.  States "most of my family and friends are dead.  I don't have anyone" when he was asked if he has any one we can speak to about him.  [name]  has depressed affect and is guarded and dismissive.  During admission - he states "Can I just answer no to everything? I just want to sleep."  He has a hx of left arm compound fracture which happened 26 years ago.  [name] denies suicidal and homicidal ideations at this time.  Denies auditory and visual hallucinations.      Patient placed on (L, M or H) Suicide Alert Level    Rationale for suicide risk level :  M     Recommendations:  medications, observation status    Presenting suicide risk assessment results and risk level were discussed with: Dr. Brett Albino (admitting physician).    Initial safety precautions : Q 15 min observations, shaving restriction, belongings search, develop in-hospital safety plan, educated to tell staff if any increase in suicidal ideation     Kaydon Husby Rayna Sexton, RN RN

## 2020-10-03 NOTE — Progress Notes (Signed)
Checklist instructions: Score the patient once a shift and as needed. Absence of a behavior results in a score of 0. Presence or increase in the display of a behavior results in a score of 1. Maximum score (SUM) possible is 6. If a behavior is at baseline for a patient, e.g. the patient is normally confused, this will result in a score of 0. An increase in confusion will result in a score of 1.      Kristopher Rogers. "[name], Piedad Climes" is being assessed under the Broset Violence Checklist for any escalations in potentially violent or harmful behavior.     Date of Admission:                                                                         Date of Assessment: 10/03/20   Assessment Time 2000      Confused 0      Irritable 0      Boisterous 0      Verbal Threats 0      Physical Threats 0      Attacking Objects 0      SUM 0          TW attempted the following interventions based on the patient's sum total score assessed on the BVC:     Should the patient require multiple interventions or fails to engage in de-escalation with staff, please review the plan for managing the care of an escalated patient.  Please do the following:  1.  Immediately notify Security and Nursing Administrative Supervisor/Director to respond  2.  Conduct team huddle to rule out clinical causes for escalated behavior  3.  Review the note authored by the Associate Professor for additional information  4.  Document specific behaviors observed  5.  Consider SAFE Team call for additional resources              The Broset Violence Checklist (BVC) is a 6 item inventory that was designed to assist in the prediction of imminent violent behavior (24 hours perspective) in healthcare and other sectors where workplace violence is acknowledged as a serious problem.   The BVC does not give instructions on what to do or what interventions to provide when a violent episode occurs; it is a took meant to aid in the risk assessment process. Interventions are based  on the individual, the environment, and cultural sensitivity indicators.

## 2020-10-03 NOTE — Progress Notes (Signed)
Checklist instructions: Score the patient once a shift and as needed. Absence of a behavior results in a score of 0. Presence or increase in the display of a behavior results in a score of 1. Maximum score (SUM) possible is 6. If a behavior is at baseline for a patient, e.g. the patient is normally confused, this will result in a score of 0. An increase in confusion will result in a score of 1.      Kristopher Rogers is being assessed under the Broset Violence Checklist for any escalations in potentially violent or harmful behavior.     Date of Admission:                                                                         Date of Assessment:    Assessment Time 0045       Confused 1      Irritable 0      Boisterous 0      Verbal Threats       Physical Threats 0      Attacking Objects 0      SUM 1          TW attempted the following interventions based on the patient's sum total score assessed on the BVC: 1= Monitoring.      Should the patient require multiple interventions or fails to engage in de-escalation with staff, please review the plan for managing the care of an escalated patient.  Please do the following:  1.  Immediately notify Security and Nursing Administrative Supervisor/Director to respond  2.  Conduct team huddle to rule out clinical causes for escalated behavior  3.  Review the note authored by the Associate Professor for additional information  4.  Document specific behaviors observed  5.  Consider SAFE Team call for additional resources              The Broset Violence Checklist (BVC) is a 6 item inventory that was designed to assist in the prediction of imminent violent behavior (24 hours perspective) in healthcare and other sectors where workplace violence is acknowledged as a serious problem.   The BVC does not give instructions on what to do or what interventions to provide when a violent episode occurs; it is a took meant to aid in the risk assessment process. Interventions are based on the  individual, the environment, and cultural sensitivity indicators.

## 2020-10-03 NOTE — Progress Notes (Signed)
Worker attempted to meet with the pt. And complete his assessment. He told me to come back in 5 minutes, then in 3 minutes. Pt. Was in his bathroom and worker saw him talking to someone in his room that wasn't there. Worker will attempt to speak to the pt. At another time to complete his assessment.

## 2020-10-04 LAB — BASIC METABOLIC PANEL
Anion Gap: 13 (ref 5.0–15.0)
Anion Gap: 14 (ref 5.0–15.0)
BUN: 19 mg/dL (ref 9.0–28.0)
BUN: 22 mg/dL (ref 9.0–28.0)
CO2: 23 mEq/L (ref 22–29)
CO2: 24 mEq/L (ref 22–29)
Calcium: 9.1 mg/dL (ref 8.5–10.5)
Calcium: 9.4 mg/dL (ref 8.5–10.5)
Chloride: 103 mEq/L (ref 100–111)
Chloride: 100 mEq/L (ref 100–111)
Creatinine: 1.1 mg/dL (ref 0.7–1.3)
Creatinine: 1.5 mg/dL — ABNORMAL HIGH (ref 0.7–1.3)
Glucose: 119 mg/dL — ABNORMAL HIGH (ref 70–100)
Glucose: 112 mg/dL — ABNORMAL HIGH (ref 70–100)
Potassium: 3.2 mEq/L — ABNORMAL LOW (ref 3.5–5.1)
Potassium: 3.6 mEq/L (ref 3.5–5.1)
Sodium: 139 mEq/L (ref 136–145)
Sodium: 138 mEq/L (ref 136–145)

## 2020-10-04 LAB — GFR
EGFR: >60
EGFR: 47.9

## 2020-10-04 LAB — HEMOGLOBIN A1C
Average Estimated Glucose: 96.8 mg/dL
Hemoglobin A1C: 5 % (ref 4.6–5.9)

## 2020-10-04 MED ORDER — CHLORDIAZEPOXIDE HCL 5 MG PO CAPS
25.0000 mg | ORAL_CAPSULE | Freq: Every day | ORAL | Status: DC
Start: 2020-10-04 — End: 2020-10-05
  Administered 2020-10-04 – 2020-10-05 (×2): 25 mg via ORAL
  Filled 2020-10-04: qty 1

## 2020-10-04 MED ORDER — POTASSIUM CHLORIDE 20 MEQ PO PACK
40.0000 meq | PACK | Freq: Once | ORAL | Status: AC
Start: 2020-10-04 — End: 2020-10-04
  Administered 2020-10-04: 14:00:00 40 meq via ORAL
  Filled 2020-10-04: qty 2

## 2020-10-04 MED ORDER — ONDANSETRON HCL 4 MG PO TABS
4.0000 mg | ORAL_TABLET | Freq: Three times a day (TID) | ORAL | Status: DC | PRN
Start: 2020-10-04 — End: 2020-10-21
  Administered 2020-10-04: 17:00:00 4 mg via ORAL

## 2020-10-04 MED ORDER — COLCHICINE 0.6 MG PO TABS
0.6000 mg | ORAL_TABLET | Freq: Once | ORAL | Status: AC
Start: 2020-10-04 — End: 2020-10-04
  Administered 2020-10-04: 17:00:00 0.6 mg via ORAL
  Filled 2020-10-04: qty 1

## 2020-10-04 MED ORDER — ALUM & MAG HYDROXIDE-SIMETH 200-200-20 MG/5ML PO SUSP
15.0000 mL | ORAL | Status: DC | PRN
Start: 2020-10-04 — End: 2020-10-21
  Administered 2020-10-04: 17:00:00 15 mL via ORAL

## 2020-10-04 MED ORDER — OLANZAPINE 10 MG PO TABS
10.0000 mg | ORAL_TABLET | Freq: Every evening | ORAL | Status: DC
Start: 2020-10-04 — End: 2020-10-07
  Administered 2020-10-04 – 2020-10-06 (×3): 10 mg via ORAL
  Filled 2020-10-04 (×2): qty 1

## 2020-10-04 MED ORDER — OLANZAPINE 5 MG PO TABS
5.0000 mg | ORAL_TABLET | Freq: Every day | ORAL | Status: DC
Start: 2020-10-04 — End: 2020-10-04

## 2020-10-04 NOTE — OT Progress Note (Signed)
[facility]   Occupational Therapy Attempted Visit Note      Patient:  Kristopher Rogers. "[name], [place]" MRN#:  [id number]  Unit:  [facility] PROFESSIONAL SERVICES BUILDING [facility] [address]OU[address] Room/Bed:  P605/P605.01    10/04/2020  Time: 1510      Patient not seen for occupational therapy secondary to pt reporting nausea on first attempt, pt requested TW return in 15 minutes. TW returned and pt was meeting with another provider. Will re-attempt as able.       Tera Helper, OT, MS OTR/L  [phone]

## 2020-10-04 NOTE — Progress Notes (Signed)
Checklist instructions: Score the patient once a shift and as needed. Absence of a behavior results in a score of 0. Presence or increase in the display of a behavior results in a score of 1. Maximum score (SUM) possible is 6. If a behavior is at baseline for a patient, e.g. the patient is normally confused, this will result in a score of 0. An increase in confusion will result in a score of 1.      Kristopher Rogers. "[name], Piedad Climes" is being assessed under the Broset Violence Checklist for any escalations in potentially violent or harmful behavior.     Date of Admission:  10/02/20                                                     Date of Assessment: 10/04/20   Assessment Time 0800      Confused 0      Irritable 0      Boisterous 0      Verbal Threats 0      Physical Threats 0      Attacking Objects 0      SUM 0          TW attempted the following interventions based on the patient's sum total score assessed on the BVC: 1= Monitoring.      Should the patient require multiple interventions or fails to engage in de-escalation with staff, please review the plan for managing the care of an escalated patient.  Please do the following:  1.  Immediately notify Security and Nursing Administrative Supervisor/Director to respond  2.  Conduct team huddle to rule out clinical causes for escalated behavior  3.  Review the note authored by the Associate Professor for additional information  4.  Document specific behaviors observed  5.  Consider SAFE Team call for additional resources              The Broset Violence Checklist (BVC) is a 6 item inventory that was designed to assist in the prediction of imminent violent behavior (24 hours perspective) in healthcare and other sectors where workplace violence is acknowledged as a serious problem.   The BVC does not give instructions on what to do or what interventions to provide when a violent episode occurs; it is a took meant to aid in the risk assessment process. Interventions are  based on the individual, the environment, and cultural sensitivity indicators.

## 2020-10-04 NOTE — Progress Notes (Signed)
Checklist instructions: Score the patient once a shift and as needed. Absence of a behavior results in a score of 0. Presence or increase in the display of a behavior results in a score of 1. Maximum score (SUM) possible is 6. If a behavior is at baseline for a patient, e.g. the patient is normally confused, this will result in a score of 0. An increase in confusion will result in a score of 1.      Kristopher Rogers. "[name], [place] Cl[place]" is being assessed under the Broset Violence Checklist for any escalations in potentially violent or harmful behavior.     Date of Admission:                                                                         Date of Assessment: 10/04/20   Assessment Time 2000      Confused 0      Irritable 0      Boisterous 0      Verbal Threats 0      Physical Threats 0      Attacking Objects 0      SUM 0          TW attempted the following interventions based on the patient's sum total score assessed on the BVC:     Should the patient require multiple interventions or fails to engage in de-escalation with staff, please review the plan for managing the care of an escalated patient.  Please do the following:  1.  Immediately notify Security and Nursing Administrative Supervisor/Director to respond  2.  Conduct team huddle to rule out clinical causes for escalated behavior  3.  Review the note authored by the Associate Professor for additional information  4.  Document specific behaviors observed  5.  Consider SAFE Team call for additional resources              The Broset Violence Checklist (BVC) is a 6 item inventory that was designed to assist in the prediction of imminent violent behavior (24 hours perspective) in healthcare and other sectors where workplace violence is acknowledged as a serious problem.   The BVC does not give instructions on what to do or what interventions to provide when a violent episode occurs; it is a took meant to aid in the risk assessment process. Interventions are based  on the individual, the environment, and cultural sensitivity indicators.

## 2020-10-04 NOTE — Progress Notes (Signed)
10/04/20 1157   Temporary Detainment Order Information   Status TDO   TDO Hearing Date: 10/03/20   LIPOS eligible? Yes (add comment)  (He may need lipos, trying to verify if he has insurance. He won't talk to this worker.)   Healthcare Decisions   Interviewed: Other (Comment)  (Chart review. Pt. keeps asking this worker to come back, then won't talk to this worker.)   Orientation/Decision Making Abilities of Patient Other (coment)  (Has some psychosis, responding to internal stimuli, and having auditory and visual hallucinations.)   Advance Directive Patient does not have advance directive   Healthcare Agent Appointed No   Authorized Actor: If you are having difficulty making decisions regarding your care/treatment: Who would you like to help you make these decisions? Adult Child   Name Soul Hackman, daughter   Telephone Number [phone]   Prior to Admission/Psychosocial   Prior level of function Independent with ADLs   Type of Residence Other (Comment)  (Living in a hotel at the [facility] for 4 years.)   Have running water, electricity, heat, etc? Yes   Living Arrangements Alone   Prior psychiatric admission? (Detail) none   Runner, broadcasting/film/video (CSB) patient? No   Substance Abuse Treatment History   Previous Substance Abuse Treatment? No   Psych Discharge Planning   PT Evaluation Needed 2   OT Evalulation Needed 2   SLP Evaluation Needed 2       Pt. Was admitted to [facility] under a TDO due to alcohol withdrawal syndrome with complication. Pt. Has a substance abuse disorder, schizophrenia, bipolar, auditory and visual hallucinations, HTN, and anxiety disorder. Pt. [facility] and had paranoid delusions that [employer] is out to kill him. Pt. Is anxious and had a ECO in place under police custody. Pt. Asked the police to shoot him earlier today. Pt. Wants to be the next [place] president. Pt. Has possible SI. Pt. Says people have been following him for years and  his fiance, Efraim Kaufmann, was murdered yesterday. Pt. Threatened to kill himself with scissors and buy over the counter pills to kill himself. Pt. Reports things to the FBI. Pt. Has had decreased sleep, racing thoughts, and impulsivity. Pt. Was found yesterday in the shower in deep prayer.    Pt. Doesn't use any drugs or smoke. He drinks daily.     Pt's mother committed suicide in the past.    Pt. Has a HS/GED degree and travels remotely.    His family lives in [place].    Pt. 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 Worker Insurance underwriter to see if the pt. Has insurance because if he doesn't, he may need lipos.     Worker spoke Forensic psychologist and there were no TDO hearings held yesterday or today.

## 2020-10-04 NOTE — Progress Notes (Signed)
Patient has attended 0 groups so far today. Writer met with pt 1: to complete safety plan. Pt was pleasant and cooperative, contracted for safety, and denies SI/HI. He described his reason here as "anxiety or fear that someone was after me." Pt said that his anxiety "comes out of nowhere" and there is no reason for it. 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  He shared that he has degrees in Lobbyist and business from [employer].    Pt identified several coping skills including compartmentalizing, playing cards, golf (though less so recently) reading, prayer, and meditation.     Writer encouraged group attendance and pt declined saying he was too exhausted.    Will continue to monitor, assess and encourage group attendance.

## 2020-10-04 NOTE — Progress Notes (Signed)
Psychiatry Progress Note  (Level 1 = Problem Focused, Level 2 = Expanded Problem Focused, Level 3 = Detailed)    Patient Name: Kristopher Rogers. "[name], [place]"            Current Date/Time:  1/4/202211:15 AM  MRN:  [id number]                            Attending Physician: Janace Aris, MD  DOB: January 17, 1961                                Gender: male                              I. History   Informants: the patient, interdisciplinary team   A. Chief Complaint or Reason for Admission      Psychosis   B.History of Present Illness: Interval History     (Symptoms and qualifiers:1 for Level 1, 2 for Level 2 and 3+ for Level 3)  Patient is a 60 y.o. male, in a relationship, unstable housing, unclear current employment,  with a history of anxiety and alcohol use disorder that was admitted under a TDO in the setting of increased paranoia and SI over the last several weeks.     No acute overnight events. Patient was observed engaging in self-talk yesterday while in the bathroom.   Taking all medications and states he is tolerating without any known side effects. Required PRN tylenol for right hand pain.   Reports poor sleep overnight, stating he had racing thoughts.   Scoring low on CIWA: 0-1.   Blood pressures elevated, most recent: 163/93.     Seen and evaluated this morning at bedside. Patient is more guarded today compared to day prior, shorter with responses. States that paranoia/anxiety about being followed and targeted has decreased since he has been admitted. Feels safe on the unit and denies feeling like anyone here is out to get him. Was much more brief with responses today. When this was pointed out patient said that he had to make a very important phone call to his work and then he would be going to nap since he was exhausted from not getting much sleep night prior.   Patient states he has not been in contact with his family since admission including his wife/fiance or children. He states he does not have  their phone numbers. When informed he could get them off his cell phone he responded, "I do not want anyone here touching my phone". He believes he has a great deal of confidential information on here from his work as a Surveyor, minerals with the [employer].   He denies any current SI, HI.     Later seen and examined again by attending physician, he continues to feel paranoid, however, was less delusional about a phone being in the hospital, rather agreed he feels confused. Much time was spent providing psychoeducation on his current presentation and the medication needed.     C.Medical History  Review of Systems  (Level 1 is none, Level 2 is 1, and Level 3 is 2+)  A complete 14 point ROS was done and was negative except for  Psychiatric: Psychosis    D.Additional Past Psychiatric, Substance Use, Medical, Family and Social History  Psychiatric: No new information available as compared to previous encounter(s)  Substance Use: No new information available as compared to previous encounter(s)  Medical: No new information available as compared to previous encounter(s)  Family: No new information available as compared to previous encounter(s)  Social: No new information available as compared to previous encounter(s)    Medications:   Current Medications  Report    Scheduled     Medication Ordered Dose/Rate, Route, Frequency Last Action    amLODIPine (NORVASC) tablet 10 mg 10 mg, PO, Daily Given, 10 mg at 01/04 0906    chlordiazePOXIDE (LIBRIUM) capsule 25 mg 25 mg, PO, Daily at 0900 Given, 25 mg at 01/04 0906    colchicine tablet 0.6 mg 0.6 mg, PO, Daily Given, 0.6 mg at 01/04 0906    colchicine tablet 0.6 mg 0.6 mg, PO, Once Ordered    folic acid (FOLVITE) tablet 1 mg 1 mg, PO, Daily Given, 1 mg at 01/04 0906    multivitamin tablet 1 tablet 1 tablet, PO, Daily Given, 1 tablet at 01/04 0906    OLANZapine (ZyPREXA) tablet 5 mg 5 mg, PO, QHS Given, 5 mg at 01/03 2119    propranolol (INDERAL) tablet 20 mg 20 mg, PO, Q8H SCH Given,  20 mg at 01/04 0619    thiamine (VITAMIN B1) tablet 100 mg 100 mg, PO, Daily Given, 100 mg at 01/04 0906          PRN     Medication Ordered Dose/Rate, Route, Frequency Last Action    acetaminophen (TYLENOL) suppository 650 mg (Or Linked Group #1) 650 mg, RE, Q6H PRN See Alternative, 01/03 0946    acetaminophen (TYLENOL) tablet 650 mg (Or Linked Group #1) 650 mg, PO, Q6H PRN Given, 650 mg at 01/03 0946    haloperidol (HALDOL) tablet 5 mg 5 mg, PO, Q4H PRN Given, 5 mg at 01/03 0023    hydrALAZINE (APRESOLINE) injection 10 mg 10 mg, IV, Q6H PRN Ordered    ibuprofen (ADVIL) tablet 600 mg 600 mg, PO, Q6H PRN Given, 600 mg at 01/04 1044    lidocaine (LIDODERM) 5 % 1 patch 1 patch, TD, Q24H PRN Ordered    LORazepam (ATIVAN) tablet 1 mg 1 mg, PO, Q4H PRN Ordered                II. Examination   Vital signs reviewed:   Blood pressure 145/87, pulse 88, temperature 97.5 F (36.4 C), resp. rate 18, SpO2 99 %.     Mental Status Exam  (Level 1 is 1-5, Level 2 is 6-8, Level 3 is 9+)  General appearance: Appears chronological age, Good hygiene and Good grooming  Attitude/Behavior: Calm, Cooperative and Guarded  Motor: No abnormalities noted  Gait: No obvious abnormalities  Muscle strength and tone: Grossly intact  Speech:   Spontaneous: Yes  Rate and Rhythm: Normal  Volume: Normal  Mood: "I feel fine"   Affect:   Range: Constricted  Stability: Stable  Appropriateness to thought content: Yes  Thought Process:   Coherent: Yes  Logical:  Yes  Associations: Goal-directed  Thought Content:   Delusions:  Yes  Suicidal:  No suicidal thoughts  Homicidal:  No homicidal thoughts  Violent Thoughts:  No   He continues to have paranoid and persecutory delusions. Also possible grandiose delusions present.  Perceptions:   Hallucinations: No- pt denies but observed responding to internal stimuli   Insight: Poor  Judgment: Poor  Cognition:   Level of Consciousness: Intact        III. Assessment and Plan (Medical Decision  Making)     1. I certify  that this patient continues to require inpatient hospitalization due to acute risk to self and unable to care for self in the community with insufficient support available    2. Psychiatric Diagnoses   Unspecified psychosis (ddx: schizophrenia vs substance induced vs schizoaffective)   Rule out Alcohol withdrawal complications presenting also with psychosis   Unspecified anxiety    Alcohol use disorder, severe dependence   Rule out neurocognitive disorder     3.All diagnostic procedures completed since admission were reviewed. Past medical records reviewed. Coordination of care was discussed with inpatient team and as available with the outpatient team.    4. On this admission patient educated about and provided input into their treatment plan.  Patient understands potential risks and benefits of proposed treatment plan.     5.  Assessment / Impression  Patient is a 60 y.o. male, in a relationship, unstable housing, unclear current employment,  with a history of anxiety and alcohol use disorder that was admitted under a TDO in the setting of increased paranoia and SI over the last several weeks. Patient is a poor historian and appears to have deficits in his memory. He exhibits paranoid and grandiose delusions of being followed and has been observed reacting to internal stimuli since being admitted. Has some history of features of bipolar although he does not presently appear to be in a manic or depressive state. Psychosis seems to be his primary issue. He has no known history of withdrawal sx from alcohol and has been scoring low on CIWA since admission. It is unlikely that his current presentation is related to alcohol withdrawal although it cannot be fully ruled out at this time.     . Will also be continuing with Librium taper and will closely monitor for withdrawal during this period. In regards to his blood pressure, elevated readings could be secondary to pain from recent gout flare or potentially from  alcohol withdrawal. Will closely monitor and adjust anti-hypertensives accordingly.     We will switch to Olanzapine 10mg  PO QHS, daytime olanzapine also contributing to orthostatic hypotension. Patient also may have continued poor oral intake.  Colchicine started by FNP due to gout.    Suicide Risk Assessment   Suicide Risk Assessment performed? Yes: Suicide Thoughts / Behaviors: None  Current Plan: None  Access to firearm: Yes, reports having several in [place].  Diagnosis of MDD, BPAD, Schizophrenia, Cluster B PD, Anorexia, Substance Use: Yes, schizophrenia, substance use.    Plan / Recommendations:    Biological Plan:  Medications:     Mood/Psychosis:   Increase to Zyprexa 5/10mg      Alcohol use disorder:   Continue taper: Librium 25mg  q24h    continue Thiamine 100mg  daily    continue Folic acid 1mg  daily    Continue CIWA    HTN:   Amlodipine 10mg  PO Qday    Gout:   Colchicine 0.5mg  PO Qday    Essential Tremors:   Propranolol 20mg  PO Q8H        Medical Work-up: FNP/medicine for gout flare, no intervention required for HTN  Consults: None required at this time    Psychosocial Plan:  Individual therapy: Psycho-education and psychotherapy of the following modalities will continue to be provided during daily visits:Supportive and Insight Oriented Psychotherapy  Group and milieu therapies: daily per unit's schedule.  Continue with Social Work intervention provided for discharge planning including assistance with follow-up psychiatric care.    Plan  for Family Involvement: Patient Refusing  Other Providers Contact Information and Dates Contacted: No Updates    Total Attending time spent 25 minutes (floor time) with more than 50 percent of time in direct patient contact, coordinating care and counseling.    Signed by: Forde Dandy, MD  10/04/2020  11:15 AM    Attending Attestation:         I saw and examined the patient with resident physician Forde Dandy, MD. I directly supervised the resident and I performed the  critical portion of the service (history, mental status exam and medical decision-making).   I discussed the patient's assessment and plan in detail with the resident physician. I reviewed the note and agree with the documented findings and plan of care with direct edits made to the note above, prior to signing it.   A total of 35  minutes was spent face to face and on the unit with greater than 50% in counseling and coordinating care as outlined above.     Signed by:  Westly Pam, MD

## 2020-10-04 NOTE — Plan of Care (Signed)
Problem: Addiction to alcohol or opioids or other substances AS EVIDENCED BY:  Goal: Patient achieves a safe detoxification and management of withdrawal symptoms  Outcome: Progressing     Problem: At Risk for Harm to Self: AS EVIDENCED BY...  Goal: Reduction of self-harm thoughts and no attempts  Outcome: Progressing  Goal: Verbalizes understanding of medication, benefits, and side effects  Outcome: Progressing       Patient isolative to room but come out briefly to make his need known  and ambulating the hallways. Calm, cooperative. Denies SI/HI/AVH. Patient right arm still swollen, received PRN Advil 600 mg for pain. Patient medications compliant. Safety maintained. Will continue to monitor patient.  Continue on CIWA monitor q4 hours. Score of 2, 1 and 0.    Patient observed resting with eyes closed for 6 hours, no acute distress noted. Safety maintained. Will continue to monitor patient.

## 2020-10-04 NOTE — Plan of Care (Addendum)
Problem: Addiction to alcohol or opioids or other substances AS EVIDENCED BY:  Goal: Patient achieves a safe detoxification and management of withdrawal symptoms  Outcome: Progressing     Problem: At Risk for Harm to Self: AS EVIDENCED BY...  Goal: Will identify long and short term goals based on individual needs and strengths  Outcome: Progressing  Goal: Reduction of self-harm thoughts and no attempts  Outcome: Progressing  Goal: Identifies stressors, protective factors, and coping strategies  Outcome: Progressing   Kristopher Rogers is alert & oriented to person, place, time but disoriented to situation & was reoriented. He denied SI/HI/AVH, pain, anxiety/depression. He stated he did not sleep well last night & his goal is to take a nap. He is frequently encouraged adequate nutrition/fluids throughout the day. Pt was offered/accepted snacks this shift. Pt stated his coping skills are "sleep, get a massage, & a quiet room with soft music." Advil 600mg  po prn offered/accepted @1044  for gout flare, per FNP, w/good effects. He is med-compliant. He presents with cooperative attitude, unstable mood, bizarre behavior. He is able to make his needs known. He is mostly isolated to his room & encouraged to attend groups. Pt intermittently observed ambulating in hall. Scheduled zyprexa 5mg  tab po not given @1304 , per MD, d/t pt BP trending low. Ice-pack offerred/accepted for right hand swelling. He is encouraged to elevate swollen extremities. Pt presents intermittently confused. He was observed standing in the shower, doing nothing, with all the lights off. He was redirected/reoriented to reality. Pt was offered/accepted advil 600mg  po prn for gout flare, maalox plus 15mL suspension po prn for indigestion, & zofran 4mg  po prn for nausea @1703 ; w/ good effects. Safety maintained. Staff will continue to monitor.     CIWA: 3, 3, 3.

## 2020-10-04 NOTE — Plan of Care (Addendum)
Problem: Addiction to alcohol or opioids or other substances AS EVIDENCED BY:  Goal: Patient achieves a safe detoxification and management of withdrawal symptoms  Outcome: Progressing     Problem: At Risk for Harm to Self: AS EVIDENCED BY...  Goal: Reduction of self-harm thoughts and no attempts  Outcome: Progressing  Goal: Verbalizes understanding of medication, benefits, and side effects  Outcome: Progressing     Patient A/Ox3, disoriented to situation. Isolative to room in bed resting. Calm and cooperative. Denies SI/HI/AVH, patient experiencing thought blocking with flat affect. Report joint swelling and pain to right arm, patient medicated prior shift for it. Vitals within normal limit. Continue on CIWA monitor on q2 hours, score of 1,1. Medications compliant. Safety maintained. Will continue to monitor patient.       Patient observed resting with eyes closed for 5 hours, no acute distress noted. Safety maintained. Will continue to monitor patient. Continue on CIWA monitor, next check at 0800.

## 2020-10-04 NOTE — Progress Notes (Addendum)
Follow-up: Gout flare     Patient missed second dose of Colchicine (0.6mg ) yesterday. He received 0.6mg  this AM. D/w pharmacist and will give an additional dose of 0.6mg  at 6pm today. Advised RN to give Ibuprofen 600mg  PRN as ordered.       Signed by: Dionicia Abler, FNP-BC

## 2020-10-05 MED ORDER — CHLORDIAZEPOXIDE HCL 5 MG PO CAPS
15.0000 mg | ORAL_CAPSULE | Freq: Every day | ORAL | Status: DC
Start: 2020-10-06 — End: 2020-10-06
  Administered 2020-10-06: 08:00:00 15 mg via ORAL
  Filled 2020-10-05: qty 1

## 2020-10-05 NOTE — Progress Notes (Signed)
Psychiatry Progress Note  (Level 1 = Problem Focused, Level 2 = Expanded Problem Focused, Level 3 = Detailed)    Patient Name: Kristopher Rogers            Current Date/Time:  1/5/20224:26 PM  MRN:  [id number]                            Attending Physician: Janace Aris, MD  DOB: 10/29/1960                                Gender: male                              I. History   Informants: the patient, interdisciplinary team   A. Chief Complaint or Reason for Admission       Psychosis   B.History of Present Illness: Interval History     (Symptoms and qualifiers:1 for Level 1, 2 for Level 2 and 3+ for Level 3)  Patient is a59 y.o.male, in a relationship, unstable housing, unclear current employment,with a history of anxiety and alcohol use disorder that was initially admitted under a TDO in the setting of increased paranoia and SI over the last several weeks. Now has agreed to voluntary admission.     No acute overnight events.  Taking scheduled medications and tolerating well. Endorses one episode of loose bowel movement this morning but denies any other potential SEs. Required PRN zofran and advil.   Attending groups.   6h of sleep overnight.   CIWA: 2, 1, 0.     Patient seen and evaluated this morning at bedside. Paranoia seems to be improving with patient. He is receptive to the possibility that his concerns of people following him and targeting him may not be entirely based in reality. He does remain confused about some matters and becomes frustrated when contradictions in his narrative are pointed out. He states his girlfriend/fiance's first name is Claris Gower but he is unable to remember her last name or her phone number. He does not know where she is living currently and has not been in contact with her for last several days-weeks.   Continues to state he is planning to run for presidency. States he has a team outside of the hospital who is helping him prepare. When asked if he believes he is still up in  the polls he becomes confused and states he never said this. When asked how it was possible for him to run in an election with Biden and Trump if that election has already occurred to which he was unable to answer.     Later he states that " running for presidency is just a dream," and could not recall his firm statements of actually  running for presidency.    C.Medical History  Review of Systems  (Level 1 is none, Level 2 is 1, and Level 3 is 2+)  A complete 14 point ROS was done and was negative except for  Psychiatric: Psychosis    D.Additional Past Psychiatric, Substance Use, Medical, Family and Social History  Psychiatric: No new information available as compared to previous encounter(s)  Substance Use: No new information available as compared to previous encounter(s)  Medical: No new information available as compared to previous encounter(s)  Family: No new information available as compared to previous  encounter(s)  Social: No new information available as compared to previous encounter(s)    Medications:   Current Medications  Report    Scheduled     Medication Ordered Dose/Rate, Route, Frequency Last Action    amLODIPine (NORVASC) tablet 10 mg 10 mg, PO, Daily Given, 10 mg at 01/05 0857    chlordiazePOXIDE (LIBRIUM) capsule 25 mg 25 mg, PO, Daily at 0900 Given, 25 mg at 01/05 0857    colchicine tablet 0.6 mg 0.6 mg, PO, Daily Given, 0.6 mg at 01/05 0857    folic acid (FOLVITE) tablet 1 mg 1 mg, PO, Daily Given, 1 mg at 01/05 0857    multivitamin tablet 1 tablet 1 tablet, PO, Daily Given, 1 tablet at 01/05 0857    OLANZapine (ZyPREXA) tablet 10 mg 10 mg, PO, QHS Given, 10 mg at 01/04 2142    propranolol (INDERAL) tablet 20 mg 20 mg, PO, Q8H SCH Given, 20 mg at 01/05 1314    thiamine (VITAMIN B1) tablet 100 mg 100 mg, PO, Daily Given, 100 mg at 01/05 0858          PRN     Medication Ordered Dose/Rate, Route, Frequency Last Action    acetaminophen (TYLENOL) suppository 650 mg (Or Linked Group #1) 650 mg, RE,  Q6H PRN See Alternative, 01/03 0946    acetaminophen (TYLENOL) tablet 650 mg (Or Linked Group #1) 650 mg, PO, Q6H PRN Given, 650 mg at 01/03 0946    alum & mag hydroxide-simethicone (MAALOX PLUS) 200-200-20 mg/5 mL suspension 15 mL 15 mL, PO, Q4H PRN Given, 15 mL at 01/04 1703    haloperidol (HALDOL) tablet 5 mg 5 mg, PO, Q4H PRN Given, 5 mg at 01/03 0023    hydrALAZINE (APRESOLINE) injection 10 mg 10 mg, IV, Q6H PRN Ordered    ibuprofen (ADVIL) tablet 600 mg 600 mg, PO, Q6H PRN Given, 600 mg at 01/05 1314    lidocaine (LIDODERM) 5 % 1 patch 1 patch, TD, Q24H PRN Ordered    LORazepam (ATIVAN) tablet 1 mg 1 mg, PO, Q4H PRN Ordered    ondansetron (ZOFRAN) tablet 4 mg 4 mg, PO, Q8H PRN Given, 4 mg at 01/04 1703                II. Examination   Vital signs reviewed:   Blood pressure 100/67, pulse 93, temperature 97.7 F (36.5 C), temperature source Oral, resp. rate 16, SpO2 96 %.     Mental Status Exam  (Level 1 is 1-5, Level 2 is 6-8, Level 3 is 9+)  General appearance: Appears chronological age, Good hygiene and Good grooming  Attitude/Behavior: Calm, Cooperative and Suspicious  Motor: No abnormalities noted  Gait: No obvious abnormalities  Muscle strength and tone: Grossly intact  Speech:   Spontaneous: Yes  Rate and Rhythm: Normal  Volume: Normal  Mood: "pretty good"   Affect:   Range: Constricted  Stability: Stable  Appropriateness to thought content: Yes  Thought Process:   Coherent: Yes  Logical:  Yes  Associations: Goal-directed  Thought Content:   Delusions:  Yes  Suicidal:  No suicidal thoughts  Perceptions:   Hallucinations: Yes, auditory  Insight: Poor  Judgment: Poor  Cognition:   Level of Consciousness: Intact    Psychiatric / Cognitive Instruments: None    Pertinent Physical Exam: Not relevant to current chief complaint/reason for admission    Imaging / EKG / Labs: None    III. Assessment and Plan (Medical Decision Making)  1. I certify that this patient continues to require inpatient hospitalization  due to acute risk to self and unable to care for self in the community with insufficient support available    2. Psychiatric Diagnoses   Unspecified psychosis (ddx: schizophrenia vs substance (alcohol) induced vs schizoaffective)   Rule out Alcohol withdrawal complications presenting also with psychosis   Unspecified anxiety   Alcohol use disorder, severedependence   Rule out neurocognitive disorder    3.All diagnostic procedures completed since admission were reviewed. Past medical records reviewed. Coordination of care was discussed with inpatient team and as available with the outpatient team.    4. On this admission patient educated about and provided input into their treatment plan.  Patient understands potential risks and benefits of proposed treatment plan.     5.  Assessment / Impression  Patient is a59 y.o.male, in a relationship, unstable housing, unclear current employment,with a history of anxiety and alcohol use disorder that was initially admitted under a TDO but now on voluntary basis, in the setting of increased paranoia and SI over the last several weeks. Patient is a poor historian and appears to have deficits in his memory. He exhibits paranoid and grandiose delusions of being followed and has been observed reacting to internal stimuli since being admitted. Has some history of features of bipolar although he does not presently appear to be in a manic or depressive state. Psychosis seems to be his primary issue. He has no known history of withdrawal sx from alcohol and has been scoring low on CIWA since admission. It is unlikely that his current presentation is related to alcohol withdrawal although it cannot be fully ruled out at this time.     Will also be continuing with Librium taper and will closely monitor for withdrawal during this period. In regards to his blood pressure, elevated readings could be secondary to pain from recent gout flare or potentially from alcohol withdrawal.  Will closely monitor and adjust anti-hypertensives accordingly. Colchicine started by FNP due to gout.    We will continue with nightly olanzapine 10mg  as this seems to be improving his psychosis.     Suicide Risk Assessment   Suicide Risk Assessment performed? Yes: Suicide Thoughts / Behaviors: None  Current Plan: None    Plan / Recommendations:    Biological Plan:  Medications:     Mood/Psychosis:   Increase to Zyprexa 5/10mg      Alcohol use disorder:   Continue taper:Librium 25mg  q24h  decrease to 15mg  for 10/07/2019   continueThiamine 100mg  daily    continueFolic acid 1mg  daily   Continue CIWA    HTN:   Amlodipine 10mg  PO Qday    Gout:   Colchicine 0.5mg  PO Qday    Essential Tremors:   Propranolol 20mg  PO Q8H      Medical Work-up: None required at this time  Consults: None required at this time    Psychosocial Plan:  Individual therapy: Psycho-education and psychotherapy of the following modalities will continue to be provided during daily visits:Supportive and Insight Oriented Psychotherapy  Group and milieu therapies: daily per unit's schedule.  Continue with Social Work intervention provided for discharge planning including assistance with follow-up psychiatric care.    Plan for Family Involvement: plan to update daughter tomorrow 10/06/20  Other Providers Contact Information and Dates Contacted: No Updates    Total Attending time spent 25 minutes (floor time) with more than 50 percent of time in direct patient contact, coordinating care and counseling.  Signed by: Forde Dandy, MD  10/05/2020  4:26 PM    Attending Attestation:         I saw and examined the patient with resident physician Forde Dandy,  MD. I directly supervised the resident and I performed the critical portion of the service (history, mental status exam and medical decision-making).   I discussed the patient's assessment and plan in detail with the resident physician. I reviewed the note and agree with the documented findings and  plan of care with direct edits made to the note above, prior to signing it.   A total of 25  minutes was spent face to face and on the unit with greater than 50% in counseling and coordinating care as outlined above.     Signed by:  Westly Pam, MD

## 2020-10-05 NOTE — OT Eval Note (Signed)
[facility]   Occupational Therapy Evaluation     Date/Time of Treatment:    Patient Name: Kristopher Rogers    MRN:  [id number]  Age: 60 y.o.  DOB: 24-Aug-1961   Unit: [facility] PROFESSIONAL SERVICES BUILDING [facility] S[facility]    Bed: P605/P605.01                 Occupational Therapy Recommendations:   Discharge Recommendations: Follow-up for continuity of care, Return to prior living situation  and Return to work when practical    Therapy discharge recommendations may change with patient status. Please refer to most recent note for up-to-date recommendations.      Assessment:   Kristopher Rogers is a 60 y.o. male admitted 10/02/2020.  Patient presents with maladaptive coping strategies. Pt appears to have decreased short term memory regarding rationale for admission to hospitalization, possibly d/t delirum and alcohol w/d. Pt completed functional tasks and answered all OT assessment questions appropriately. Pt appears to be at functional baseline. At this time, patient required no acute OT services.     Cognitive Status and Neuro Exam  Alertness/Arousal: Appropriate responses to stimuli  Orientation: person, place, time/date and situation  Appearance: in hospital gowns and disheveled  Mood: euthymic  Affect: full range  Behavior: Cooperative  Eye contact: WNL   Thought Content: appropriate for session, No SI, HI, delusional thoughts, or preoccupations  Thought Process: disorganized  Speech: normal volume and normal pitch  Attention Span: Appears intact  Memory: Decreased recall of recent events  Following Commands: multi-step commands, min verbal instruction  Insight: Limited  Problem solving and judgement: Good  Impulse control: Normal  Participation: good  Gait/movement: steady and coordinated      Plan:   OT frequency: 2-3x/week  OT type: individual    Pt was able to engage in treatment planning and goal setting with TW.     History of Present Illness:   Consult received for Kristopher Rogers for OT  Evaluation and Treatment.  Patients medical condition is appropriate for Occupational Therapy intervention at this time. Kristopher Rogers is a 60 y.o. male admitted on 10/02/2020 with Psychosis [F29]  Suicidal behavior with attempted self-injury [T14.91XA]  Bipolar I disorder, most recent episode manic, severe with psychotic features [F31.2]. Patient is in bed with eyes closed. Pt unable to indicate rationale for admission with mod verbal cues.  Per H&P--"Patient is a 60 y.o. male, in a relationship, unstable housing, unclear current employment,  with a history of anxiety and alcohol use disorder that was admitted under a TDO in the setting of increased paranoia and SI over the last several weeks. Biologically the patient does not appear to be at increased risk of mental illness with no reported history in his family. A neurocognitive component is also possible given his poor memory and recall. Psychologically, he is experiencing paranoid and grandiose delusions, potential AVH, insomnia, racing thoughts, and increased goal directed activities. He has poor insight and judgement and is a poor historian. He was started on sertraline 4 weeks ago by his PCP which seems to coincide with decompensation. Socially, the patient has known history of alcohol use disorder and has been drinking 3 vodka drinks/day. His last drink was 6 days ago. Denies any hx of withdrawal seizures, blackouts, or DTs. Is currently on a Librium taper for withdrawal. 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     Past Psychiatric/Substance Use History  Psychiatric History: None  Use of Substances: alcohol (per H&P, pt drinks 3 vodka drinks/day)    Subjective:   Patient is agreeable to participation in the therapy session.     "I never forget to take my medication"    Pt Goals: to go back home    Pain:   Score: 6   Location: R hand during movement  Intervention: pt declines interventions at this time    Objective:     Musculoskeletal Examination  RUE ROM: decreased AROM and PROM d/t pain  LUE ROM: WFL    Sensory/Oculomotor Examination  Auditory: WFL  Tactile: LT intact UEs  Vision: tracking WFL    Activities of Daily Living  Eating: Independent  Grooming: Independent   Bathing: pt reports he is currently independent with bathing  UE Dressing: Independent   LE Dressing: Independent   Toileting: Independent    Functional Mobility:  Supine to Sit: independent  Sit to Stand: Independent    PMP Activity: Step 6 - Walks in Room      Balance  Static Sitting: good  Dynamic Sitting: good  Static Standing: good  Dynamic Standing: good    Participation and Activity Tolerance  Participation Effort: good  Endurance: fair+     Instrumental Activities of Daily Living  Medication Management: Independent, (pt describes home medication routine in detail and reports that he is able to take meds independently)   Safety Awareness and Emergency Management: Needs further assessment    Goals:    LTG: pt will demo skills for independent living for resumption of community roles by d/c  STG: pt will ID 3/4 unsafe situations with min prompting for increased home safety in 2 session.        PPE worn during session: N95 and goggles    Time of Treatment:   OT Received On: 10/05/20  Start Time: 1147  Stop Time: 1205  Time Calculation (min): 18 min    Signed By:  Tera Helper, OT, MS OTR/L  [phone]

## 2020-10-05 NOTE — Progress Notes (Signed)
[facility]   Recreational Therapy Evaluation     Date/Time of Treatment:    Patient Name: Kristopher Rogers    MRN:  [id number]  Age: 60 y.o.  DOB: [birth year]   Unit: [facility] PROFESSIONAL SERVICES BUILDING [facility]    Bed: P605/P605.01                 Assessment:   Kristopher Rogers is a 60 y.o. male admitted 10/02/2020.  Patient will benefit from recreation therapy interventions at this time for stop drinking/drug use, reducing boredom, new hobbies/leisure interests, relaxation skills, structuring free time and developing coping skills.      Plan   Type of treatment: Group  RT Frequency:  1-2x/ week  Educated the patient to role of recreation therapy, plan of care, goals of therapy and RT groups offered.    The patient and Recreation Therapist engaged in plan of care development and goal setting. The patient agrees with both.     Group recommendations coping skills through leisure, problem solving, relaxation skills, physical activity, leisure education and leisure skills    History of Present Illness:   Order received for Kristopher Rogers for RT Evaluation and Treatment.  Patients medical condition is appropriate for Recreation Therapy intervention at this time. Kristopher Rogers is a 60 y.o. male admitted on 10/02/2020 with Psychosis [F29]  Suicidal behavior with attempted self-injury [T14.91XA]  Bipolar I disorder, most recent episode manic, severe with psychotic features [F31.2].  Pt indicates rationale for admission as "I had an anxiety attack, no prior triggers. I walked into [employer] and thought people were following me. An employee asked if I needed help and I said yes. She called the police and then I was brought in to hospital."  Per H&P--"[name] is a 60 year old male with PMHx of HTN, gout, alcohol abuse, and psychosis who was BIB police after he walked into a [employer] stating that he wanted to kill himself. Per chart review, patient also reported that the [employer] were after him to kill him. On  admission, he was found to be in AKI and admitted to the medical unit. He was transferred to the psych unit today. On encounter with patient, he reports that he was here 2/2 "threats from other people". He would not elaborate. Per patient, his permanent home is in [place] but he comes to [place] for work. He is currently living alone in [place], [place]. He denies recreational drug use or tobacco use and reports drinking about "2-3 vodka drinks a day". He is not sexually active. Currently, he denies SI/HI/AVH. He reports pain and swelling to right hand 2/2 a fall yesterday. "    Pt wore mask during therapy session:No        Subjective:   Patient is agreeable to participation in the therapy session. Pt was calm and cooperative. Pt states that his anxiety isn't normally an issue.     Pt. identified tx. goal as "Do the best job I can wherever I am working". When asked if pt could ID a goal for mental health wellness pt stated "compartamentalize what is important".      Objective:   Pt. was able to answer all questions with No difficulty. RT collaborated with pt. to identify RT tx. goal.     Barriers to Leisure Engagement   Cognitive: No Problems  Physical: No Problems  Social/Behavioral: No Problems   Emotional: Anxiety Pt  denies experiencing paranoia but stated prior to  admission he felt that people were following him.     Current Leisure Interests (Past 90 Days): thinking about what needs to change for the next day (related to work), watching TV, sports fan, astronomy, spirituality   Past Leisure Interests (No Longer Involved): fishing. Pt states that him and his wife are going to start their own garden which he is looking forward to.     Goals:   RT goal: Prior to d/c, pt will ID min. 5 leisure coping skills as evidence by completing Leisure Coping Skills Plan to assist with increasing leisure awareness and managing emotions after d/c.       PPE worn during session: procedural mask, N95 and goggles    Signed By: Eliezer Lofts, CTRS  [phone]

## 2020-10-05 NOTE — PT Eval Note (Signed)
[facility]   Physical Therapy Evaluation/Discharge   Patient: Kristopher Rogers    MRN#: [id number]   Unit: [facility] BUILD[facility]  Bed: P605/P605.01      Post Acute Care Therapy Recommendations:   Discharge Recommendations: Home with supervision,Home with home health PT     Milestones to be reached to achieve recommendation: none  Anticipate achievement in n/a sessions    DME Recommended for Discharge:  (None)      Assessment:   Significant Findings: None    Kristopher Rogers is a 60 y.o. male admitted 10/02/2020.  Patient presents with increased paranoia and SI. Pt currently presents with R hand and R knee edema; R hand reddened and warm to the touch with pt demonstrating impaired finger opposition and dec grasp. Pt also noted with dec gait efficiency demonstrating mild limp with R stance phase. Pt able to ambulate and perform all functional mobility with mod I; no LOB or instability observed. Educated pt on utilizing cold packs to assist in pain relief and dec in edema with pt verbalizing understanding. No further acute PT is required at this time; D/C PT.        Impairments: Assessment: Appears to be at baseline for mobility;Appears to be at baseline for balance.     Therapy Diagnosis: Impaired functional mobility    Rehabilitation Potential: Prognosis: Good    Treatment Activities: Evaluation    Educated the patient to role of physical therapy, plan of care, goals of therapy and safety with mobility and ADLs, energy conservation techniques, home safety.    Plan:   Treatment/Interventions: No skilled interventions needed at this time     PT Frequency: one time visit - therapy discontinued   Risks/Benefits/POC Discussed with Pt/Family: With patient        Precautions and Contraindications:   Other Precautions: CIWA, Paranoia, AH/VH    Consult received for Kristopher Rogers for PT Evaluation and Treatment.  Patients medical condition is appropriate for Physical  therapy intervention at this time.    Medical Diagnosis: Psychosis [F29]  Suicidal behavior with attempted self-injury [T14.91XA]  Bipolar I disorder, most recent episode manic, severe with psychotic features [F31.2]      History of Present Illness:   Kristopher Rogers is a 60 y.o. male admitted on 10/02/2020 with unstable housing, unclear current employment,with a history of anxiety and alcohol use disorder that was admitted under a TDO in the setting of increased paranoia and SI over the last several weeks.   -Per MD note    Past Medical/Surgical History:  Past Medical History:   Diagnosis Date    Hypertension      No past surgical history on file.      X-Rays/Tests/Labs:  XR Hand Right PA And Lateral    Result Date: 10/03/2020  No acute changes. Kristopher Ensign, MD  10/03/2020 1:43 PM      Social History:   Prior Level of Function:  Prior level of function: Independent with ADLs,Ambulates independently  Baseline Activity Level: Community ambulation  Driving: independent  DME Currently at Home:  (None)    Home Living Arrangements:  Living Arrangements: Alone  Type of Home:  (pt reports living in a hotel)  Home Layout: One level (2 FOS to hotel room)  DME Currently at Home:  (None)    Subjective: My hand hurts   Patient is agreeable to participation in the therapy session. Nursing clears patient for therapy.  Pain Assessment  Pain Assessment: Wong-Baker FACES  Wong-Baker FACES Pain Rating: Hurts little more  POSS Score: Awake and Alert  Pain Location: Hand  Pain Orientation: Right  Pain Intervention(s): Cold applied    Objective:   Observation of Patient/Vital Signs:  Patient is out of bed, ambulating with no medical equipment in place.  Pt wore mask during therapy session:No      Observation of Patient/Vital signs:  Inspection/Posture: R hand edema-warm to touch, bruises on bil wrists, R knee edema    Cognition/Neuro Status  Arousal/Alertness: Appropriate responses to stimuli  Attention Span: Appears  intact  Orientation Level: Oriented X4  Memory: Appears intact  Following Commands: Follows multistep commands consistently  Safety Awareness: minimal verbal instruction  Insights: Educated in Engineer, building services  Problem Solving: Assistance required to identify errors made;Assistance required to implement solutions;minimal assistance  Behavior: calm;cooperative  Motor Planning: intact  Coordination: GMC impaired    Musculoskeletal Examination:  Gross ROM  Right Upper Extremity ROM: within functional limits  Left Upper Extremity ROM: within functional limits  Right Lower Extremity ROM: within functional limits  Left Lower Extremity ROM: within functional limits    Gross Strength  Right Upper Extremity Strength:  (Grossly >/=3/5)  Left Upper Extremity Strength:  (Grossly >/=3/5)  Right Lower Extremity Strength: 4/5  Left Lower Extremity Strength: 4/5         Functional Mobility:  Supine to Sit: Modified Independent  Scooting to HOB: Modified independent  Scooting to EOB: Modified Independent  Sit to Supine: Modified Independent  Sit to Stand: Modified Independent  Stand to Sit: Modified Independent         Ambulation:  PMP - Progressive Mobility Protocol   PMP Activity: Step 6 - Walks in Room  Distance Walked (ft) (Step 6,7): 50 Feet     Ambulation: Modified Independent (no AD)  Pattern: R decreased stance time;decreased cadence;decreased step length ("limp" with R stance phase)     Balance:  Sitting - Static: Good  Sitting - Dynamic: Good  Standing - Static:  (Good-)  Standing - Dynamic:  (Good-)         Participation and Activity Tolerance:  Participation Effort: good  Endurance: Tolerates 30 min exercise with multiple rests         PPE worn during session: procedural mask, goggles and gloves    Tech present: None  PPE worn by tech: N/A    Renato Gails, PT,DPT pager [phone] 10/05/2020 3:43 PM       Time of treatment:   PT Received On: 10/05/20  Start Time: 1300  Stop Time: 1330  Time Calculation (min): 30 min

## 2020-10-05 NOTE — Progress Notes (Signed)
Checklist instructions: Score the patient once a shift and as needed. Absence of a behavior results in a score of 0. Presence or increase in the display of a behavior results in a score of 1. Maximum score (SUM) possible is 6. If a behavior is at baseline for a patient, e.g. the patient is normally confused, this will result in a score of 0. An increase in confusion will result in a score of 1.      Kristopher Rogers is being assessed under the Broset Violence Checklist for any escalations in potentially violent or harmful behavior.     Date of Admission:  10/02/20                                                       Date of Assessment: 10/05/20   Assessment Time 0800      Confused 0      Irritable 0      Boisterous 0      Verbal Threats 0      Physical Threats 0      Attacking Objects 0      SUM 0          TW attempted the following interventions based on the patient's sum total score assessed on the BVC: 1= Monitoring.      Should the patient require multiple interventions or fails to engage in de-escalation with staff, please review the plan for managing the care of an escalated patient.  Please do the following:  1.  Immediately notify Security and Nursing Administrative Supervisor/Director to respond  2.  Conduct team huddle to rule out clinical causes for escalated behavior  3.  Review the note authored by the Associate Professor for additional information  4.  Document specific behaviors observed  5.  Consider SAFE Team call for additional resources              The Broset Violence Checklist (BVC) is a 6 item inventory that was designed to assist in the prediction of imminent violent behavior (24 hours perspective) in healthcare and other sectors where workplace violence is acknowledged as a serious problem.   The BVC does not give instructions on what to do or what interventions to provide when a violent episode occurs; it is a took meant to aid in the risk assessment process. Interventions are based on the  individual, the environment, and cultural sensitivity indicators.

## 2020-10-05 NOTE — Plan of Care (Signed)
Problem: Addiction to alcohol or opioids or other substances AS EVIDENCED BY:  Goal: Patient achieves a safe detoxification and management of withdrawal symptoms  Outcome: Progressing  Goal: Patient develops a discharge plan  Outcome: Not Progressing     Problem: At Risk for Harm to Self: AS EVIDENCED BY...  Goal: Reduction of self-harm thoughts and no attempts  Outcome: Progressing  Goal: Identify and participate in supportive program therapies  Outcome: Not Progressing     Problem: Psychosocial and Spiritual Needs  Goal: Demonstrates ability to cope with hospitalization/illness  Outcome: Progressing   Kristopher Rogers is alert & oriented to person, place & time. He denied SI/HI/AVH, pain, anxiety, depression. He stated he slept well last night. He is continuously encouraged to maintain adequate fluids & nutrition throughout the day. Pt is med-compliant. He presents with bizarre behavior, & unstable mood. He is able to make his needs known. He is mostly isolated to his room, even though he is frequently encouraged to participate in milieu & attend groups. He was offered/accepted advil 600mg  po prn for gout flare & ice-pack for swollen right hand @1314 , w/good effects. Pt is intermittently visible in milieu. Safety maintained. Staff will continue to monitor.     CIWA: 2, 1, 0.

## 2020-10-05 NOTE — Progress Notes (Signed)
Checklist instructions: Score the patient once a shift and as needed. Absence of a behavior results in a score of 0. Presence or increase in the display of a behavior results in a score of 1. Maximum score (SUM) possible is 6. If a behavior is at baseline for a patient, e.g. the patient is normally confused, this will result in a score of 0. An increase in confusion will result in a score of 1.      Kristopher Rogers is being assessed under the Broset Violence Checklist for any escalations in potentially violent or harmful behavior.     Date of Admission:                                                                         Date of Assessment: 10/05/20   Assessment Time 2000      Confused 0      Irritable 0      Boisterous 0      Verbal Threats 0      Physical Threats 0      Attacking Objects 0      SUM 0          TW attempted the following interventions based on the patient's sum total score assessed on the BVC:     Should the patient require multiple interventions or fails to engage in de-escalation with staff, please review the plan for managing the care of an escalated patient.  Please do the following:  1.  Immediately notify Security and Nursing Administrative Supervisor/Director to respond  2.  Conduct team huddle to rule out clinical causes for escalated behavior  3.  Review the note authored by the Associate Professor for additional information  4.  Document specific behaviors observed  5.  Consider SAFE Team call for additional resources              The Broset Violence Checklist (BVC) is a 6 item inventory that was designed to assist in the prediction of imminent violent behavior (24 hours perspective) in healthcare and other sectors where workplace violence is acknowledged as a serious problem.   The BVC does not give instructions on what to do or what interventions to provide when a violent episode occurs; it is a took meant to aid in the risk assessment process. Interventions are based on the  individual, the environment, and cultural sensitivity indicators.

## 2020-10-05 NOTE — Progress Notes (Signed)
Patient has attended 0 groups so far today. TW spoke briefly with Kristopher Rogers today. Hurgedwriter to call emergency because his wife's uterus had exploded. He then asked about the [employer] being on the unit. Provided reassurance to Kristopher Rogers. Will continue to monitor, assess and encourage group attendance.

## 2020-10-05 NOTE — Progress Notes (Signed)
Worker spoke to [employer] at [phone] and they said the pt. Had his court hearing on 10/03/20 and was committed. Worker asked for the pt's lipos to be faxed to [phone].

## 2020-10-06 ENCOUNTER — Inpatient Hospital Stay: Payer: PRIVATE HEALTH INSURANCE

## 2020-10-06 DIAGNOSIS — F312 Bipolar disorder, current episode manic severe with psychotic features: Secondary | ICD-10-CM

## 2020-10-06 DIAGNOSIS — T1491XA Suicide attempt, initial encounter: Secondary | ICD-10-CM

## 2020-10-06 DIAGNOSIS — M10341 Gout due to renal impairment, right hand: Secondary | ICD-10-CM

## 2020-10-06 DIAGNOSIS — F10239 Alcohol dependence with withdrawal, unspecified: Secondary | ICD-10-CM

## 2020-10-06 LAB — BASIC METABOLIC PANEL
Anion Gap: 12 (ref 5.0–15.0)
BUN: 30 mg/dL — ABNORMAL HIGH (ref 9.0–28.0)
CO2: 20 mEq/L — ABNORMAL LOW (ref 22–29)
Calcium: 9.1 mg/dL (ref 8.5–10.5)
Chloride: 106 mEq/L (ref 100–111)
Creatinine: 1.7 mg/dL — ABNORMAL HIGH (ref 0.7–1.3)
Glucose: 118 mg/dL — ABNORMAL HIGH (ref 70–100)
Potassium: 3.9 mEq/L (ref 3.5–5.1)
Sodium: 138 mEq/L (ref 136–145)

## 2020-10-06 LAB — GFR: EGFR: 41.4

## 2020-10-06 NOTE — Consults (Signed)
MEDICINE NEW CONSULT    Date Time: 10/06/20 2:01 PM  Patient Name: Kristopher Rogers  Requesting Physician: Janace Aris, MD  Consulting Physician: Tamela Oddi, MD, MD    Primary Care Physician: Oneita Hurt, None, MD    Reason for Consultation: Right hand swelling after fall and AKI       Assessment:     Patient Active Problem List    Diagnosis Date Noted    Suicidal behavior with attempted self-injury 10/03/2020    Bipolar I disorder, most recent episode manic, severe with psychotic features 10/03/2020    Alcohol withdrawal syndrome with complication 09/30/2020     Kristopher Rogers is a 60 year old male withno known psychiatric hx who was just admitted to the medicine service 12/31-1/2 on a TDO as he was having paranoid ideations (walked into a [facility] and said he wanted to kill himself with scissors or OTC pills and made comments about Terrorist Organizations chasing him and wanting to kill him).  There was concern for ETOH withdrawal as well as he reported 3 drinks per day and had hand tremors.   He was started on Librium and prn ativan briefly but there was no obvious ETOH withdrawal.  His hand tremors were from known essential tremors and he used to be on Propranolol which was resumed.  He also had AKI with a Cr of 1.8 on admission but this improved to 1.1 after rehydration and stopping his losartan.  He was transferred to inpatient psych on 10/02/20 where he is currently receiving voluntary psychiatric treatment.     Medicine was asked to consult today due to worsening renal fct  ( Cr today 1.7 from 1.1 on discharge from Medicine service).  Patient reports that he fell a injured his right hand a few days ago and developed pain and swelling of the right hands 2 MCP joint.  Xray of the hand was negative.  There was a concern for gout attack and he was started on Colchicine 1.2 mg x 1 on 1/3 and prn Advil which he has been getting 1-2 x/day and did get 600 mg today.       Patient reports a remote tx of gout  which usually affects his left great toe and usually is triggered by dehydration.  He reports some mild transient pain in his right great toe which has now subsided.  He reports that the pain and swelling in the right hand is getting better. He is not on Allopurinol.  His blood pressure and essential tremors are currently well controlled on a combination of Norvasc and propranolol which will be continued.    On exam there is mild erythema on the dorsum of the right hand with mild swelling over the 2 MCP joint and mild decreased ROM. No findings to suggest infectious etiology.      He reports that he is drinking a lot of fluid now pointing to several half empty pitchers of water on the bedside table.      A/P  # AKI with cr 1.7 from 1.1 on day of [place] from medicine service 1/2 in light of receiving Advil and colchicine for possible gout flare versus trauma to the right hand/second metacarpal phalangeal joint.   - Stop Advil and colchicine ( ordered)  - Ice to the hand as needed  - Use as needed Tylenol for pain -> he does not want to start scheduled Tylenol as he does not like to take pills.  -Stay well-hydrated drinking at least  2 L of water per day-> discussed with patient and RN.  Patient seems to be motivated and is already drinking good amounts of fluid.  He denies any dysuria no flank pain no difficulty emptying his bladder.  -Repeat BMP tomorrow.  -If ongoing AKI will require further work-up with renal ultrasound and UA but suspect that this AKI is related to medication and dehydration.  - Avoid NSAID or other nephrotoxic medications  -If patient has recurrent gouty attacks he may benefit from allopurinol as outpt.       # Acute psychosis -> doing better.    - As per Psychiatry      # Essential tremors and HTN  -Continue propranolol and Norvasc.  Do not resume losartan which was discontinued on his discharge 1/2.     # Prolonged QTc 509->resolved on repeat EKG       Kristopher Rogers I, MD, thank you for this  consultation.  We will follow the patient with you during this hospitalization.  Please contact me with any questions or issues.    History of Presenting Illness:   Kristopher Rogers is a 60 y.o. male who presents to the hospital with ( see above)     Past Medical History:     Past Medical History:   Diagnosis Date    Hypertension        Available old records reviewed, including:  Epic chart     Past Surgical History:   No past surgical history on file.    Family History:   No family history on file.    Social History:     Social History     Tobacco Use   Smoking Status Never Smoker   Smokeless Tobacco Never Used     Social History     Substance and Sexual Activity   Alcohol Use Yes    Comment: patient states he has "a few mixed drinks after work" daily      Social History     Substance and Sexual Activity   Drug Use Never       Allergies:     Allergies   Allergen Reactions    Penicillins Rash       Medications:     Home Medications             amLODIPine (NORVASC) 10 MG tablet     Take 1 tablet (10 mg total) by mouth daily     folic acid (FOLVITE) 1 MG tablet     Take 1 tablet (1 mg total) by mouth daily     magnesium oxide (MAG-OX) 400 MG tablet     Take 1 tablet (400 mg total) by mouth 2 (two) times daily     multivitamin (MULTIVITAMIN) Tab     Take 1 tablet by mouth daily     propranolol (INDERAL) 20 MG tablet     Take 1 tablet (20 mg total) by mouth 3 (three) times daily     thiamine (B-1) 100 MG tablet     Take 1 tablet (100 mg total) by mouth daily                              Current Facility-Administered Medications   Medication Dose Route Frequency    amLODIPine  10 mg Oral Daily    folic acid  1 mg Oral Daily    multivitamin  1 tablet Oral Daily  OLANZapine  10 mg Oral QHS    propranolol  20 mg Oral Q8H SCH    thiamine  100 mg Oral Daily            Review of Systems:   All other systems were reviewed and are negative except as mentioned above. No fevers. No cough.    Physical Exam:     Patient  Vitals for the past 24 hrs:   BP Temp Temp src Pulse Resp SpO2   10/06/20 0747 120/88 97.5 F (36.4 C)  90  98 %   10/06/20 0745 135/77 97.5 F (36.4 C) Oral 91  98 %   10/06/20 0539 131/68   79  96 %   10/06/20 0539 131/68   79 18 96 %   10/05/20 2159 110/69   96 18 98 %   10/05/20 2053 100/68 98.7 F (37.1 C) Oral 95 18 97 %   10/05/20 1519 100/67 97.7 F (36.5 C) Oral 93 16 96 %     There is no height or weight on file to calculate BMI.    Intake/Output Summary (Last 24 hours) at 10/06/2020 1401  Last data filed at 10/05/2020 1732  Gross per 24 hour   Intake 400 ml   Output    Net 400 ml       General: awake, alert, oriented x 3; no acute distress.  HEENT: perrla, eomi, sclera anicteric  oropharynx clear without lesions, mucous membranes moist  Neck: supple, no lymphadenopathy, no thyromegaly, no JVD, no carotid bruits  Cardiovascular: regular rate and rhythm, no murmurs, rubs or gallops  Lungs: clear to auscultation bilaterally, without wheezing, rhonchi, or rales  Abdomen: soft, non-tender, non-distended; no palpable masses, no hepatosplenomegaly, normoactive bowel sounds, no rebound or guarding  Extremities: no clubbing, cyanosis, or edema  Neuro: A+O x 3, cranial nerves grossly intact, strength 5/5 in upper and lower extremities, sensation intact,   Skin: no rashes or lesions noted  Other: Mild erythema and swelling of 2 mcp joint, No indirect ttp, mildly decreased ROM from swelling.       Labs:   No results for input(s): WBC, HGB, HCT, PLT in the last 72 hours.    Recent Labs     10/06/20  0807 10/04/20  1724   Sodium 138 138   Potassium 3.9 3.6   Chloride 106 100   CO2 20* 24   BUN 30.0* 22.0   Creatinine 1.7* 1.5*   Glucose 118* 112*   Calcium 9.1 9.4       No results for input(s): AST, ALT, ALKPHOS, PROT, ALB in the last 72 hours.    No results for input(s): PTT, PT, INR in the last 72 hours.        Signed by: Tamela Oddi, MD, MD    cc: Janace Aris, MD  Pcp, None, MD

## 2020-10-06 NOTE — Progress Notes (Signed)
Checklist instructions: Score the patient once a shift and as needed. Absence of a behavior results in a score of 0. Presence or increase in the display of a behavior results in a score of 1. Maximum score (SUM) possible is 6. If a behavior is at baseline for a patient, e.g. the patient is normally confused, this will result in a score of 0. An increase in confusion will result in a score of 1.      Januel Cong Hightower is being assessed under the Broset Violence Checklist for any escalations in potentially violent or harmful behavior.     Date of Admission:     10/02/20                                                                    Date of Assessment:    Assessment Time 0800      Confused 0-      Irritable 0      Boisterous 0      Verbal Threats 0      Physical Threats 0      Attacking Objects 0      SUM 0          TW attempted the following interventions based on the patient's sum total score assessed on the BVC: 1= Monitoring.      Should the patient require multiple interventions or fails to engage in de-escalation with staff, please review the plan for managing the care of an escalated patient.  Please do the following:  1.  Immediately notify Security and Nursing Administrative Supervisor/Director to respond  2.  Conduct team huddle to rule out clinical causes for escalated behavior  3.  Review the note authored by the Associate Professor for additional information  4.  Document specific behaviors observed  5.  Consider SAFE Team call for additional resources              The Broset Violence Checklist (BVC) is a 6 item inventory that was designed to assist in the prediction of imminent violent behavior (24 hours perspective) in healthcare and other sectors where workplace violence is acknowledged as a serious problem.   The BVC does not give instructions on what to do or what interventions to provide when a violent episode occurs; it is a took meant to aid in the risk assessment process. Interventions are based  on the individual, the environment, and cultural sensitivity indicators.

## 2020-10-06 NOTE — Plan of Care (Signed)
Problem: At Risk for Harm to Self: AS EVIDENCED BY...  Goal: Reduction of self-harm thoughts and no attempts  Outcome: Progressing  Goal: Verbalizes understanding of medication, benefits, and side effects  Outcome: Progressing     Problem: Safety  Goal: Patient will be free from injury during hospitalization  Outcome: Progressing       Kristopher Rogers A/Ox2, disoriented to situation and time. Isolative to room, calm, cooperative. Denies SI/H/AVH or pain. Meds compliant. Right arm still swollen, on medication for it.  Safety maintained. Vital stable. Will continue to monitor patient.     Patient had CT of the head done  By 0120.     Kristopher Rogers observed resting with eyes closed for 5 hours, no acute distress noted. Safety maintained. Will continue to monitor patient.

## 2020-10-06 NOTE — Progress Notes (Signed)
Noted that pt's BMP from today shows BUN of 30 and creatinine of 1.7 which has trended up from 10/04/20.  Pt has also been c/o right hand pain and pt is already taking Colchicine 0.6mg  po daily. Right hand xray on 10/03/20 did not show any acute changes but did show mild deformity of third distal phalangeal tuft which is chronic. Consulted medicine team.  Medicine hospitalist will see pt.  -Clarene Reamer, NP-c

## 2020-10-06 NOTE — Progress Notes (Addendum)
Psychiatry Progress Note  (Level 1 = Problem Focused, Level 2 = Expanded Problem Focused, Level 3 = Detailed)    Patient Name: Kristopher Rogers            Current Date/Time:  1/6/202211:51 AM  MRN:  [id number]                            Attending Physician: Janace Aris, MD  DOB: Oct 12, 1960                                Gender: male                              I. History   Informants: the patient, interdisciplinary team   A. Chief Complaint or Reason for Admission       Psychosis   B.History of Present Illness: Interval History     (Symptoms and qualifiers:1 for Level 1, 2 for Level 2 and 3+ for Level 3)  Patient is a59 y.o.male, in a relationship, unstable housing, unclear current employment,with a history of anxiety and alcohol use disorder that was initially admitted under a TDO in the setting of increased paranoia and SI over the last several weeks. Now has agreed to voluntary admission.     No acute overnight events. Head CT w/o contrast showed no acute intracranial abnormalities and chronic small vessel ischemic changes. He denies any side effects from the librium taper and is still bothered by his swollen right hand. He was able to recount circumstances leading to admission stating that "I went to the pharmacy and asked for help, and the police came and took me to see a psychiatrist." He states that his mood is "comfortable" and he would like to discharge.  Taking scheduled medications and tolerating well. Endorses brief nausea this morning that self-resolved. Attending groups. Paranoia seems to be improving with patient but still present.  He did not reference his presidential ambitions today. Later was inquired by attending physician, he stated " I can't anymore, because Biden ended up becoming president."  He was also praying in the bathroom and shared with therapist yesterday that ''to call emergency because his wife's uterus had exploded. He then asked about the [employer] being on the  unit."    Attempted to reach patient's daughter Kristopher Rogers [phone]: She states between her and brothers they try to contact him weekly and they know "last week he called her brother and was in a delusional state and they urged him to go to the doctor. Alcohol plays a big part in his illness. The last few years, he had been functionally working and had a job. He has been living at a hotel and has someone cleaning his room and driving him around so that he can keep up somewhat of a normal life. At the end of the day, he is an alcoholic and although we have tried to get him help for him he has been in denial about that. I don't know about diagnoses but delusions have gotten progressively worse over the past decade and it has never been as bad as it was last week. He becomes paranoid and accusatory after drinking alcohol. 20 years ago he was completely functional." She states one other occasion a few years ago that he "had a similar mental break where he  was paranoid and delusional about reality." She stated that if someone from the family needs to be there to pick him up to please let them know with enough notice because the [place] lives out of state    C.Medical History  Review of Systems  (Level 1 is none, Level 2 is 1, and Level 3 is 2+)  A complete 14 point ROS was done and was negative except for  Psychiatric: Psychosis    D.Additional Past Psychiatric, Substance Use, Medical, Family and Social History  Psychiatric: No new information available as compared to previous encounter(s)  Substance Use: No new information available as compared to previous encounter(s)  Medical: No new information available as compared to previous encounter(s)  Family: No new information available as compared to previous encounter(s)  Social: No new information available as compared to previous encounter(s)    Medications:   Current Medications  Report    Scheduled     Medication Ordered Dose/Rate, Route, Frequency Last Action     amLODIPine (NORVASC) tablet 10 mg 10 mg, PO, Daily Given, 10 mg at 01/06 0824    chlordiazePOXIDE (LIBRIUM) capsule 15 mg 15 mg, PO, Daily at 0900 Given, 15 mg at 01/06 0824    colchicine tablet 0.6 mg 0.6 mg, PO, Daily Given, 0.6 mg at 01/06 0824    folic acid (FOLVITE) tablet 1 mg 1 mg, PO, Daily Given, 1 mg at 01/06 0825    multivitamin tablet 1 tablet 1 tablet, PO, Daily Given, 1 tablet at 01/06 0824    OLANZapine (ZyPREXA) tablet 10 mg 10 mg, PO, QHS Given, 10 mg at 01/05 2200    propranolol (INDERAL) tablet 20 mg 20 mg, PO, Q8H SCH Given, 20 mg at 01/06 0540    thiamine (VITAMIN B1) tablet 100 mg 100 mg, PO, Daily Given, 100 mg at 01/06 0825          PRN     Medication Ordered Dose/Rate, Route, Frequency Last Action    acetaminophen (TYLENOL) suppository 650 mg (Or Linked Group #1) 650 mg, RE, Q6H PRN See Alternative, 01/03 0946    acetaminophen (TYLENOL) tablet 650 mg (Or Linked Group #1) 650 mg, PO, Q6H PRN Given, 650 mg at 01/03 0946    alum & mag hydroxide-simethicone (MAALOX PLUS) 200-200-20 mg/5 mL suspension 15 mL 15 mL, PO, Q4H PRN Given, 15 mL at 01/04 1703    haloperidol (HALDOL) tablet 5 mg 5 mg, PO, Q4H PRN Given, 5 mg at 01/03 0023    hydrALAZINE (APRESOLINE) injection 10 mg 10 mg, IV, Q6H PRN Ordered    ibuprofen (ADVIL) tablet 600 mg 600 mg, PO, Q6H PRN Given, 600 mg at 01/05 1314    lidocaine (LIDODERM) 5 % 1 patch 1 patch, TD, Q24H PRN Ordered    LORazepam (ATIVAN) tablet 1 mg 1 mg, PO, Q4H PRN Ordered    ondansetron (ZOFRAN) tablet 4 mg 4 mg, PO, Q8H PRN Given, 4 mg at 01/04 1703                II. Examination   Vital signs reviewed:   Blood pressure 120/88, pulse 90, temperature 97.5 F (36.4 C), resp. rate 18, SpO2 98 %.     Mental Status Exam  (Level 1 is 1-5, Level 2 is 6-8, Level 3 is 9+)  General appearance: Appears chronological age, Good hygiene and Good grooming  Attitude/Behavior: Calm, Cooperative and Suspicious  Motor: No abnormalities noted  Gait: No obvious  abnormalities  Muscle strength  and tone: Grossly intact  Speech:   Spontaneous: Yes  Rate and Rhythm: Normal  Volume: Normal  Mood: "comfortable"   Affect:   Range: Constricted  Stability: Stable  Appropriateness to thought content: Yes   Anxious appearing.  Thought Process:   Coherent: Yes  Logical:  Yes  Associations: Goal-directed  Thought Content:   Delusions:  Not elicited  Suicidal:  No suicidal thoughts  Perceptions:   Hallucinations: Yes, auditory  Insight: Poor  Judgment: Poor  Cognition:   Level of Consciousness: Intact      III. Assessment and Plan (Medical Decision Making)     1. I certify that this patient continues to require inpatient hospitalization due to acute risk to self and unable to care for self in the community with insufficient support available    2. Psychiatric Diagnoses   Unspecified psychosis (ddx: schizophrenia vs substance (alcohol) induced vs schizoaffective)   Rule out Alcohol withdrawal complications presenting also with psychosis   Unspecified anxiety   Alcohol use disorder, severedependence   Rule out neurocognitive disorder    3.All diagnostic procedures completed since admission were reviewed. Past medical records reviewed. Coordination of care was discussed with inpatient team and as available with the outpatient team.    4. On this admission patient educated about and provided input into their treatment plan.  Patient understands potential risks and benefits of proposed treatment plan.     5.  Assessment / Impression  Patient is a59 y.o.male, in a relationship, unstable housing, unclear current employment,with a history of anxiety and alcohol use disorder that was initially admitted under a TDO but now on voluntary basis, in the setting of increased paranoia and SI over the last several weeks. Patient is a poor historian and appears to have deficits in his memory. He exhibits paranoid and grandiose delusions of being followed and has been observed reacting to  internal stimuli since being admitted. Has some history of features of bipolar although he does not presently appear to be in a manic or depressive state. Psychosis seems to be his primary issue. He has no known history of withdrawal sx from alcohol and has been scoring low on CIWA since admission. It is unlikely that his current presentation is related to alcohol withdrawal although it cannot be fully ruled out at this time. Head CT w/o contrast did not show major intracranial abnormalities.     Will also be continuing with Librium taper with final dose of Librium on 06JAN today and will closely monitor for withdrawal during this period. In regards to his blood pressure, elevated readings could be secondary to pain from recent gout flare or potentially from alcohol withdrawal. Will closely monitor and adjust anti-hypertensives accordingly. Colchicine started by FNP due to gout. Now discontinued as patient has developed AKI.    We will continue with nightly olanzapine 10mg  as this seems to be improving his psychosis.     Suicide Risk Assessment   Suicide Risk Assessment performed? Yes: Suicide Thoughts / Behaviors: None  Current Plan: None    Plan / Recommendations:    Biological Plan:  Medications:     Mood/Psychosis:   Increase to Zyprexa 5/10mg      AKI:   Cr today 1.7 from 1.1   Stay hydrated at least 2L of water/day   If AKI continues then further renal ultrasound maybe needed.   Avoid NSAIDS    Alcohol use disorder:   Stop Librium taper   continueThiamine 100mg  daily    continueFolic  acid 1mg  daily   Continue CIWA    HTN:   Amlodipine 10mg  PO Qday    Gout:   Colchicine 0.5mg  PO Qday stopped due to AKI   Tylenol as needed      Essential Tremors:   Propranolol 20mg  PO Q8H      Medical Work-up: F/u BMP  Consults: Internal medicine consult appreciated.    Psychosocial Plan:  Individual therapy: Psycho-education and psychotherapy of the following modalities will continue to be provided during  daily visits:Supportive and Insight Oriented Psychotherapy  Group and milieu therapies: daily per unit's schedule.  Continue with Social Work intervention provided for discharge planning including assistance with follow-up psychiatric care.    Plan for Family Involvement: plan to update daughter tomorrow 10/06/20  Other Providers Contact Information and Dates Contacted: No Updates    Total Attending time spent 35 minutes (floor time) with more than 50 percent of time in direct patient contact, coordinating care and counseling.    Signed by: Ina Homes, MD  10/06/2020  11:51 AM      Attending Attestation:         I saw and examined the patient as well as did resident physician Ina Homes, MD. I directly supervised the resident and I performed the critical portion of the service (history, mental status exam and medical decision-making).   I discussed the patient's assessment and plan in detail with the resident physician. I reviewed the note and agree with the documented findings and plan of care with direct edits made to the note above, prior to signing it.   A total of 35  minutes was spent face to face and on the unit with greater than 50% in counseling and coordinating care as outlined above.     Signed by:  Westly Pam, MD

## 2020-10-06 NOTE — OT Progress Note (Signed)
[facility]   Occupational Therapy Treatment     Patient Name: Kristopher Rogers    MRN:  [id number]  Age: 60 y.o.  DOB: 06-Nov-1960   Unit: [facility]    Bed: P605/P605.01                 Occupational Therapy Recommendations:   Discharge Recommendations: Follow-up for continuity of care, Return to work when practical and Supervised living     Therapy discharge recommendations may change with patient status. Please refer to most recent note for up-to-date recommendations.    Subjective:   Patient is agreeable to participation in the therapy session.     "What kind of assessment is it?"    Pain:   Score: none stated    Objective:   Kristopher Rogers is a 60 y.o. male admitted on 10/02/2020 with Psychosis [F29]  Suicidal behavior with attempted self-injury [T14.91XA]  Bipolar I disorder, most recent episode manic, severe with psychotic features [F31.2]. Patients medical condition is appropriate for Occupational Therapy intervention at this time. Patient is seated at edge of bed with RN present, giving medications. Pt transition to OT session with independence. Pt engaged in OT treatment regarding home safety. Pt engaged in KELS assessment.    [place] Evaluation of Living Skills Evaluation (KELS)     Reason for Assessment:  Concerns about being home alone    Self Care:  Appearance: 0=Independent  Self-care: 1=Needs Assistance   Pt reporting below minimum standards for wash face (2-3 times/week) (Minimum standards: shower once a week, wash face once daily, wash hair once a week, comb hair once daily, brush teeth once daily, eat twice daily)    Safety and Health:  Awareness of Dangerous Household: 0=Independent  Pt correctly ID'd dangerous situation in 4/4 photographs.  Identifies Actions for Sickness/Accidents: 0=Independent  Pt correctly ID'd appropriate actions for sickness and accidents.    Knowledge of Emergency Numbers: 0=Independent  Knowledge of  Medical/Dental Facilities: 1=Needs Assistance  Pt was unable to ID specific name or location of medical/dental facilities. Pt was vague when answering questions, stating "my regular doctor" and "my dentist office" and when asked the location, "down the street a few blocks" and "down the road about half a mile".    Money Management:  Use of money to purchase items: 1=Needs Assistance  Pt correctly answered 1/2 purchasing tasks. 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  Payment of Bills: 1=Needs Assistance  Pt ID'd electronic bill pay as method of paying bills at home but was unable to correctly complete electronic bill pay task. Pt indicated difficulties as not having his reading glasses and finger pain with keyboard use from broken 2nd and 3rd digits on R hand.    Transportation and Telephone:  Mobility within community: 0=Independent  Pt indicated "drive" as method of transportation.  Knowledge of Transit System: 0=Independent  Pt indicated "look up times online" as method to find transit information.  Use of Telephone: 1=Needs Assistance  Pt unable to complete telephone use task, pt able to dial phone number with encouragement but unable to listen to entirety of recorded menu to gather requested information.    Work and Leisure:  Training and development officer of Future Employment: 0=Independent  Pt plans to return to current job within the next month.  Leisure Activity Involved: 0=Independent  Pt ID'd leisure tasks as read magazines (frequently), watch sports (yesterday), play golf (last year), cook (2 years ago), and "virtual stuff" such as sports betting with friends (3 weeks ago).    Assessment:  Impairments: money management    Recommendations:  Pt appears to be able to be safe and meet  basic needs living alone with the following assistance provided during the week or month:  Regular phone calls/emails/video calls to check in made by: family, friends  Assistance with money management provided by: family      Pt wore mask during therapy session:No      Educated the patient to role of occupational therapy, plan of care, goals of therapy and Home safety.    Assessment:   Kristopher Rogers is a 60 y.o. male admitted 10/02/2020. Pt participated well in OT session. He was able to engage in all tasks appropriately and follow directions accurately. He would benefit from weekly check-ins from friends and family to ensure his successful functioning at home. Patient will continue to benefit from occupational therapy interventions at this time for resumption of community roles.    Cognitive Status and Neuro Exam  Alertness/Arousal: Appropriate responses to stimuli  Orientation: person, place, time/date and situation  Appearance: age appropriate and in hospital gowns  Mood: euthymic  Affect: constricted  Behavior: Cooperative, Attentive  Eye contact: WNL   Thought Content: appropriate for session, No SI, HI, delusional thoughts, or preoccupations  Thought Process: linear and logical and goal directed  Speech: normal volume and normal pitch  Attention Span: Appears intact  Memory: Appears intact  Following Commands: multi-step commands, min verbal instruction  Insight: Fair, Aware of deficits  Problem solving and judgement: Fair, Assistance required to implement solutions, minimal assistance  Impulse control: Normal  Participation: good  Gait/movement: steady and coordinated    Goals:  Patient Goal: rest  LTG--Progressing--Pt will demo skills for independent living for resumption of community roles by d/c  STG--Met --Pt will ID 3/4 unsafe situations with min prompting for increased home safety in 2 session    Plan:   Type of treatment: Individual  OT Frequency: 1-2x/ week    The patient and Occupational Therapist  engaged in plan of care development and goal setting. The patient agrees with both.          PPE worn during session: procedural mask, N95 and face shield    Time of Treatment  OT Received On: 10/06/20  Start Time: 1434  Stop Time: 1505  Time Calculation (min): 31 min    Treatment # 1       Signed By: Drema Dallas, OT  [phone]

## 2020-10-06 NOTE — Progress Notes (Signed)
Worker scanned the lipos into the pt's chart.     Worker left a VM for Orvil Feil at [phone] to extend the pt's lipos. Worker asked for a return call.

## 2020-10-06 NOTE — Progress Notes (Signed)
Patient has attended 0 groups so far today. Worker went to pt's room to encourage group attendance. Pt said that he may attend tomorrow. Will continue to monitor, assess and encourage group attendance.

## 2020-10-06 NOTE — Plan of Care (Addendum)
Problem: At Risk for Harm to Self: AS EVIDENCED BY...  Goal: Will identify long and short term goals based on individual needs and strengths  Outcome: Progressing  Flowsheets (Taken 10/06/2020 1020)  Pt will identify long and short term treatment goals based on individual needs and strengths: Assist to identify goals for treatment based on individual needs and strengths  Note: Received patient in the room, calm and pleasant on approach, AO x 3, disoriented to situation, denies SI.HI.AVH, R hand edema "gone down, got better" offered ice pack with acceptance, denies pain, appetite and energy "getting better", denies anxiety/depression thi morning, VSS, BM yesterday, ADLs independent, engaged in conversation pleasantly, denies medication side effect, continue on Na restriction, continue on I&O (encuraged drinking fluid) and CIWA- no symptoms of alcohol withdrawal noted, d/c CIWA & Librium during shift, pt stated "I will attend groups, staring next week, think about this and that, relax this week", q 15 minute rounding for pt safety    Internal team MD visited for R hand edema, d/c colchicine & ibuprofen, ordered BMP. Inpatient consult for right hand edema and elevating BUN and creatinine    Two occupational therapiests visited patient during shift

## 2020-10-06 NOTE — Plan of Care (Addendum)
Problem: At Risk for Harm to Self: AS EVIDENCED BY...  Goal: Identify and participate in supportive program therapies  Outcome: Progressing    Kristopher Rogers was observed in his bathroom.  TW knocked and entered and he was standing in a dark bathroom. He said, "the noise bothers me and I like to be by myself".  He came out of the bathroom to talk to TW.  He discussed all the things that were physically bothering him such as his swollen right hand and his left foot gout and his right knee that he attributes to playing sports when he was younger.  TW then asked about the things that were upsetting him mentally or emotionally and he said, "oh, I'm doing ok".  He was asked if he went to group and he said he would "next week".  TW came back later to give him his scheduled medication and he began to have a conversation.  He said, "I'm not talking to you, I am talking to him".  The only two people in the room were TW and the patient. Pt is med compliant. Pt is positive for AVH. Pt denies HI/SI.     Slept about 6.5 hours

## 2020-10-06 NOTE — Progress Notes (Addendum)
Patient left the unit with staff for CT scan of the head at 1250 on stable condition.       Patient return back to the unit safe with staff.

## 2020-10-07 DIAGNOSIS — N179 Acute kidney failure, unspecified: Secondary | ICD-10-CM | POA: Diagnosis present

## 2020-10-07 LAB — BASIC METABOLIC PANEL
Anion Gap: 11 (ref 5.0–15.0)
BUN: 30 mg/dL — ABNORMAL HIGH (ref 9.0–28.0)
CO2: 21 mEq/L — ABNORMAL LOW (ref 22–29)
Calcium: 8.6 mg/dL (ref 8.5–10.5)
Chloride: 107 mEq/L (ref 100–111)
Creatinine: 1.3 mg/dL (ref 0.7–1.3)
Glucose: 96 mg/dL (ref 70–100)
Potassium: 3.8 mEq/L (ref 3.5–5.1)
Sodium: 139 mEq/L (ref 136–145)

## 2020-10-07 LAB — GFR: EGFR: 56.4

## 2020-10-07 MED ORDER — OLANZAPINE 5 MG PO TABS
15.0000 mg | ORAL_TABLET | Freq: Every evening | ORAL | Status: DC
Start: 2020-10-07 — End: 2020-10-14
  Administered 2020-10-07 – 2020-10-13 (×7): 15 mg via ORAL
  Filled 2020-10-07 (×7): qty 1

## 2020-10-07 NOTE — Treatment Plan (Signed)
Interdisciplinary Treatment Plan Update Meeting    10/07/2020  Kristopher Rogers    Participants:  Patient:  Kristopher Rogers  Attending Physician:  DR. Loleta Chance (Resident)  RN: Josiah Lobo    Short Term Goal: Working on making his left knee better, make needs known, attend groups.   Long Term Goal: work with the team to feel better. Team's goal for pt is to follow up with resources that will be provided to him after discharge.  Objective:  Review response to treatment, reassess needs/goals, update plan as indicated incorporating patient's strengths and stated needs, goals, and preferences. Pt has been isolative to his room, presents to be anxious, but expresses that he has been feeling better since the admission.     1. Summary of Patient Progress on Treatment Plan Goals:  Pt will work on attending groups, coming out form his room to make his needs known, perform ADL'S independently, take care of himself, eat and drink adequately.     2. Level of Patient Involvement:  Actively engaged/contributing    3. Patient Understanding of Plan of Care:  Able to verbalize goals and interventions    4. Level of Agreement/Commitment to Plan of Care:  Agrees with and is committed to plan of care          Contributor Signatures:      MD_________________________________ Date___________________    SW_________________________________Date ___________________    RN _________________________________Date____________________    Patient_______________________________Date____________________    Other________________________________Date ___________________    Other________________________________Date ___________________    (This document is signed electronically by Clinical research associate and electronic co-signer.  Other participants sign a printed copy which is scanned into the EMR)

## 2020-10-07 NOTE — Plan of Care (Signed)
Problem: Addiction to alcohol or opioids or other substances AS EVIDENCED BY:  Goal: Will identify long and short term goals based on individual needs and strengths  Outcome: Progressing  Goal: Patient educated about the disease of addiction and recovery and identifies long-term goal  Outcome: Progressing     Problem: At Risk for Harm to Self: AS EVIDENCED BY...  Goal: Reduction of self-harm thoughts and no attempts  Outcome: Progressing   Kristopher Rogers is on day 5 of admission, calm, cooperative, complaint with his medications but with encouragement. Pt was found in his room this morning laying on the floor, initially refusing his morning meds. Pt was encouraged and educated on his medications, and the purpose for them, he then took the medications. Pt seem anxious, had slight tremors to his upper extremities, was persuaded to get on the bed but he stated, his wife makes him sleep on floor and actually the firm floor suits him better. Pt was then provided with pillow and blanket on the floor. Pt was later found standing in the middle of his room, quiet, calm and was approached for ITP, pt.  will occasionally stop the doctor from questioning his state of mind, signal silence with his finger on his mouth, but then state " lets talk about me and what plans from now onwards". Pt did participate in his ITP but seems to be giving direct answers and " yes" to everything in order to end the session.   Pt observed isolated to his room, no interaction with staff, unable to make needs known until he is asked about it. Pt encouraged to be drinking fluid, pt eating okay. Pt being encouraged to attend groups, come out to make needs known, and to interact with peers and staff.   Rounding's done, will continue to monitor.

## 2020-10-07 NOTE — Progress Notes (Signed)
Psychiatry Progress Note  (Level 1 = Problem Focused, Level 2 = Expanded Problem Focused, Level 3 = Detailed)    Patient Name: Kristopher Rogers            Current Date/Time:  1/7/202212:02 PM  MRN:  [id number]                            Attending Physician: Janace Aris, MD  DOB: [birth year]                                Gender: male                              I. History   Informants: the patient, interdisciplinary team   A. Chief Complaint or Reason for Admission       Psychosis   B.History of Present Illness: Interval History     (Symptoms and qualifiers:1 for Level 1, 2 for Level 2 and 3+ for Level 3)  Patient is a59 y.o.male, in a relationship, unstable housing, unclear current employment,with a history of anxiety and alcohol use disorder that was initially admitted under a TDO in the setting of increased paranoia and SI over the last several weeks. Now has agreed to voluntary admission.     No acute overnight events. Medicine was consulted yesterday due to worsening renal function and NSAIDs were discontinued, they will continue to monitor. Today patient was reported to be having auditory hallucinations and bizarre prayer behaviors but denied these symptoms on interview. ITP was conducted and patient's short term goal is to continue to improve his left knee by walking and his long term goal is to go back to work with [employer]. He states that he lives in an apartment and his family does not need to be local for him to go home. He got angry and forcefully put his finger to he mouth while briefly lurching towards PGY2 when PGY2 asked to recount events leading up to admission. Patient composed self and stated that he "wants to focus on the future." He is denying suicidal or homicidal ideation and states that his hand pain is improving.     Later seen again, the attending physician discussed his episodes of paranoia and confusion, and he stated he feels that is decreasing. However, he was seen internally  preoccupied non purposefully just standing in the bathroom. He is redirectable and is able to have discussion, but still unable to give a detailed plan of how he will take care of himself when outside of here.  He denies SI and HI, AH present, denies VH. He still has Paranoia agreed to increase Olanzapine.    C.Medical History  Review of Systems  (Level 1 is none, Level 2 is 1, and Level 3 is 2+)  A complete 14 point ROS was done and was negative except for  Psychiatric: Psychosis  Muscular: Pain    D.Additional Past Psychiatric, Substance Use, Medical, Family and Social History  Psychiatric: No new information available as compared to previous encounter(s)  Substance Use: No new information available as compared to previous encounter(s)  Medical: No new information available as compared to previous encounter(s)  Family: No new information available as compared to previous encounter(s)  Social: No new information available as compared to previous encounter(s)    Medications:   Current  Medications  Report    Scheduled     Medication Ordered Dose/Rate, Route, Frequency Last Action    amLODIPine (NORVASC) tablet 10 mg 10 mg, PO, Daily Given, 10 mg at 01/07 0831    folic acid (FOLVITE) tablet 1 mg 1 mg, PO, Daily Given, 1 mg at 01/07 0831    multivitamin tablet 1 tablet 1 tablet, PO, Daily Given, 1 tablet at 01/07 0831    OLANZapine (ZyPREXA) tablet 10 mg 10 mg, PO, QHS Given, 10 mg at 01/06 2200    propranolol (INDERAL) tablet 20 mg 20 mg, PO, Q8H SCH Given, 20 mg at 01/07 0802    thiamine (VITAMIN B1) tablet 100 mg 100 mg, PO, Daily Given, 100 mg at 01/07 0831          PRN     Medication Ordered Dose/Rate, Route, Frequency Last Action    acetaminophen (TYLENOL) suppository 650 mg (Or Linked Group #1) 650 mg, RE, Q6H PRN See Alternative, 01/03 0946    acetaminophen (TYLENOL) tablet 650 mg (Or Linked Group #1) 650 mg, PO, Q6H PRN Given, 650 mg at 01/03 0946    alum & mag hydroxide-simethicone (MAALOX PLUS) 200-200-20  mg/5 mL suspension 15 mL 15 mL, PO, Q4H PRN Given, 15 mL at 01/04 1703    haloperidol (HALDOL) tablet 5 mg 5 mg, PO, Q4H PRN Given, 5 mg at 01/03 0023    hydrALAZINE (APRESOLINE) injection 10 mg 10 mg, IV, Q6H PRN Ordered    lidocaine (LIDODERM) 5 % 1 patch 1 patch, TD, Q24H PRN Ordered    LORazepam (ATIVAN) tablet 1 mg 1 mg, PO, Q4H PRN Ordered    ondansetron (ZOFRAN) tablet 4 mg 4 mg, PO, Q8H PRN Given, 4 mg at 01/04 1703                II. Examination   Vital signs reviewed:   Blood pressure (!) 143/93, pulse (!) 104, temperature 98.1 F (36.7 C), temperature source Oral, resp. rate 18, height 1.803 m (5' 10.98"), weight 86.2 kg (190 lb 0.6 oz), SpO2 98 %.     Mental Status Exam  (Level 1 is 1-5, Level 2 is 6-8, Level 3 is 9+)  General appearance: Appears chronological age, Good hygiene and Poor grooming  Attitude/Behavior: Calm, Cooperative and Suspicious  Motor: No abnormalities noted  Gait: No obvious abnormalities  Muscle strength and tone: Grossly intact  Speech:   Spontaneous: Yes  Rate and Rhythm: Normal  Volume: Normal  Mood: "I want to talk about the future, not the past"   Affect:   Range: Constricted  Stability: Stable  Appropriateness to thought content: Yes   Anxious appearing.  Thought Process:   Coherent: Yes  Logical:  Yes  Associations: Goal-directed  Thought Content:   Delusions:  Not elicited  Suicidal:  No suicidal thoughts delusions not elicited during encounter but have consistently been present  Perceptions:   Hallucinations: Yes, auditory per observation during the day but patient denies  Insight: Poor  Judgment: Poor  Cognition:   Level of Consciousness: Intact      III. Assessment and Plan (Medical Decision Making)     1. I certify that this patient continues to require inpatient hospitalization due to acute risk to self and unable to care for self in the community with insufficient support available    2. Diagnoses   Unspecified psychosis (ddx: schizophrenia vs substance (alcohol)  induced vs schizoaffective)   Rule out Alcohol withdrawal complications presenting also with  psychosis   Unspecified anxiety   Alcohol use disorder, severedependence   Rule out neurocognitive disorder   History of Gout   HTN   Covid-19 Positive    3.All diagnostic procedures completed since admission were reviewed. Past medical records reviewed. Coordination of care was discussed with inpatient team and as available with the outpatient team.    4. On this admission patient educated about and provided input into their treatment plan.  Patient understands potential risks and benefits of proposed treatment plan.     5.  Assessment / Impression  Patient is a59 y.o.male, in a relationship, unstable housing (but reports apartment, daughter reports hotel), unclear current employment,with a history of anxiety and alcohol use disorder that was initially admitted under a TDO but now on voluntary basis, in the setting of increased paranoia and SI over the last several weeks. Patient is a poor historian and appears to have deficits in his memory. He exhibits paranoid and grandiose delusions of being followed and has been observed reacting to internal stimuli since being admitted. Has some history of features of bipolar although he does not presently appear to be in a manic or depressive state. Psychosis seems to be his primary issue. He has no known history of withdrawal sx from alcohol and has been scoring low on CIWA since admission. It is unlikely that his current presentation is related to alcohol withdrawal although it cannot be fully ruled out at this time. Head CT w/o contrast did not show major intracranial abnormalities.     Librium taper is complete with final dose of Librium on 06JAN without further withdrawal symptoms. In regards to his blood pressure, elevated readings could be secondary to pain from recent gout flare or potentially from alcohol withdrawal. Will closely monitor and adjust  anti-hypertensives accordingly. Colchicine started by FNP due to gout but discontinued per medicine due to worsening renal function 2/2 AKI.    We will continue with nightly Olanzapinee seems to help his psychosis.     Suicide Risk Assessment   Suicide Risk Assessment performed? Yes: Suicide Thoughts / Behaviors: None  Current Plan: None    Plan / Recommendations:    Biological Plan:  Medications:     Mood/Psychosis:    INCREASE Zyprexa 15mg  QHS    AKI:   Cr 07JAN 1.7 from 1.1   Stay hydrated at least 2L of water/day   If AKI continues then further renal ultrasound maybe needed.   Avoid NSAIDS    Alcohol use disorder:   continueThiamine 100mg  daily    continueFolic acid 1mg  daily    HTN:   Amlodipine 10mg  PO Qday    Gout:   Colchicine 0.5mg  PO Qday stopped due to AKI   Tylenol as needed      Essential Tremors:   Propranolol 20mg  PO Q8H    COVID-19 Positive:   Due to potential exposure was tested, asymptomatic currently, continue monitoring.   Continue Isolation for COVID-19    Medical Work-up: F/u BMP  Consults: Internal medicine consult appreciated.    Psychosocial Plan:  Individual therapy: Psycho-education and psychotherapy of the following modalities will continue to be provided during daily visits:Supportive and Insight Oriented Psychotherapy  Group and milieu therapies: daily per unit's schedule.  Continue with Social Work intervention provided for discharge planning including assistance with follow-up psychiatric care.    Plan for Family Involvement: Daughter updated 06JAN  Other Providers Contact Information and Dates Contacted: No Updates    Total Attending time spent 35  minutes (floor time) with more than 50 percent of time in direct patient contact, coordinating care and counseling.    Signed by: Ina Homes, MD  10/07/2020  12:02 PM      Attending Attestation:         I saw and examined the patient with resident physician Ina Homes, MD , MD. I directly supervised the resident  and I performed the critical portion of the service (history, mental status exam and medical decision-making).   I discussed the patient's assessment and plan in detail with the resident physician. I reviewed the note and agree with the documented findings and plan of care with direct edits made to the note above, prior to signing it.   A total of 35  minutes was spent face to face and on the unit with greater than 50% in counseling and coordinating care as outlined above.     Signed by:  Westly Pam, MD

## 2020-10-07 NOTE — Plan of Care (Addendum)
Problem: Addiction to alcohol or opioids or other substances AS EVIDENCED BY:  Goal: Will identify long and short term goals based on individual needs and strengths  Outcome: Progressing     Problem: Addiction to alcohol or opioids or other substances AS EVIDENCED BY:  Goal: Patient achieves a safe detoxification and management of withdrawal symptoms  Outcome: Progressing     Problem: Addiction to alcohol or opioids or other substances AS EVIDENCED BY:  Goal: Patient educated about the disease of addiction and recovery and identifies long-term goal  Outcome: Progressing     Problem: At Risk for Harm to Self: AS EVIDENCED BY...  Goal: Will identify long and short term goals based on individual needs and strengths  Outcome: Progressing     Patient was seen sitting on the floor in his room on the start of the shift. Patient was assisted to sit on the bed. He was calm and cooperative with care. He was alert and oriented X 4. He denies SI/HI/AVH . Noted swelling on right hand. He stated "I fall down two weeks ago". He complain of pain in right hand , pain scale of 5/10. RN offered pain medication but he refused. He reports of good mood and appetite. He states his goal is "to enjoy sleeping in my own bed". He denies sleeping difficulty. He took his HS schedule medication. No sign and symptoms of COVID-19 observed. No signs of respiratory distress noted. Will continue to monitor for safety.     Patient was observed sleeping with eye closed throughout the night. He slept for 5 hours.

## 2020-10-07 NOTE — Progress Notes (Signed)
Checklist instructions: Score the patient once a shift and as needed. Absence of a behavior results in a score of 0. Presence or increase in the display of a behavior results in a score of 1. Maximum score (SUM) possible is 6. If a behavior is at baseline for a patient, e.g. the patient is normally confused, this will result in a score of 0. An increase in confusion will result in a score of 1.      Kristopher Rogers is being assessed under the [place] Violence Checklist for any escalations in potentially violent or harmful behavior.     Date of Admission:   10/02/20                                                                      Date of Assessment: 10/06/20   Assessment Time 2000       Confused 1      Irritable 0      Boisterous 0      Verbal Threats 0      Physical Threats 0      Attacking Objects 0      SUM 1          TW attempted the following interventions based on the patient's sum total score assessed on the BVC: 1= Monitoring.      Should the patient require multiple interventions or fails to engage in de-escalation with staff, please review the plan for managing the care of an escalated patient.  Please do the following:  1.  Immediately notify Security and Nursing Administrative Supervisor/Director to respond  2.  Conduct team huddle to rule out clinical causes for escalated behavior  3.  Review the note authored by the Associate Professor for additional information  4.  Document specific behaviors observed  5.  Consider SAFE Team call for additional resources              The Broset Violence Checklist (BVC) is a 6 item inventory that was designed to assist in the prediction of imminent violent behavior (24 hours perspective) in healthcare and other sectors where workplace violence is acknowledged as a serious problem.   The BVC does not give instructions on what to do or what interventions to provide when a violent episode occurs; it is a took meant to aid in the risk assessment process. Interventions are  based on the individual, the environment, and cultural sensitivity indicators.

## 2020-10-07 NOTE — Progress Notes (Signed)
MEDICINE CONSULT FOLLOW-UP    Date Time: 10/07/20 7:54 AM  Patient Name: Kristopher Rogers  Attending Physician: Janace Aris, MD  Consulting Physician: Ulice Bold, MD, MD      Assessment:     Patient Active Problem List    Diagnosis Date Noted    Suicidal behavior with attempted self-injury 10/03/2020    Bipolar I disorder, most recent episode manic, severe with psychotic features 10/03/2020    Alcohol withdrawal syndrome with complication 09/30/2020       60 year old male with no prior psychiatric history hypertension, essential tremors, admitted to medicine service 12/31 through 1/2 as TDO for paranoid and suicidal ideations, possible alcohol withdrawal, AKI, and transferred to inpatient psychiatry on 1/2 Geri psychiatric treatment.  Medicine consulted on 1/6 for worsening renal function, with creatinine up to 1.7 from prior 1.1.  Recently initiated on colchicine and Advil for possible gout flare versus hand trauma. Colchicine and NSAID placed on hold and creatinine now improving.     Recommendations:   AKI, suspect related to NSAID  Tremors  Hypertension  Acute psychosis  Hx gout  R hand trauma  BMP today with downtrending creatinine  Patient reports history of gout, usually in the feet, ankles, knees.  Check uric acid level in a.m.   Patient reports recent trauma to right hand with swelling, now improving. XR R hand 1/3 without evidence of acute fx or bony erosions      CC: follow-up: AKI    HPI/Subjective: Vitals stable on room air.  Labs notable for Cr 1.3.   Reports feeling well.  Reports he is hydrating well and urinating well.  Reports right hand swelling is improving.  Still some pain and redness.  Reports he usually has gout in his ankles feet or knees. Reports recent gout flare of L ankle    Review of Systems:   Review of Systems - Negative except as noted in HPI        Physical Exam:     Patient Vitals for the past 24 hrs:   BP Temp Temp src Pulse Resp SpO2   10/07/20 0729 (!) 147/93  98.1 F (36.7 C) Oral (!) 104 18 98 %   10/07/20 0514 123/80 97.9 F (36.6 C) Oral 77 18 98 %   10/06/20 1935 121/80 97.5 F (36.4 C) Oral 93 15 98 %   10/06/20 1514 (!) 136/94 97.3 F (36.3 C) Oral 89  99 %   10/06/20 1428 114/79   98       There is no height or weight on file to calculate BMI.    Intake/Output Summary (Last 24 hours) at 10/07/2020 0754  Last data filed at 10/06/2020 1900  Gross per 24 hour   Intake 2000 ml   Output    Net 2000 ml       General: awake, alert, no acute distress.  Cardiovascular: regular rate and rhythm, no murmurs, rubs or gallops  Lungs: clear to auscultation bilaterally, without wheezing, rhonchi, or rales  Abdomen: soft, non-tender, non-distended; no palpable masses, no hepatosplenomegaly, normoactive bowel sounds, no rebound or guarding  Extremities: R first MCP erythema, swelling, warmth to touch. Bilateral 1+ lower ext edema L > R    Meds:     Medications were reviewed:    Labs:   No results for input(s): WBC, HGB, HCT, PLT in the last 72 hours.    Recent Labs     10/06/20  0807 10/04/20  1724   Sodium 138  138   Potassium 3.9 3.6   Chloride 106 100   CO2 20* 24   BUN 30.0* 22.0   Creatinine 1.7* 1.5*   Glucose 118* 112*   Calcium 9.1 9.4       No results for input(s): AST, ALT, ALKPHOS, PROT, ALB in the last 72 hours.    No results for input(s): PTT, PT, INR in the last 72 hours.    Imaging personally reviewed, including:   XR R hand  There is no evidence of an acute fracture. No bony erosions  are seen. The joint spaces are normal. There is mild deformity of the  third distal phalangeal tuft which is chronic.        Case discussed with: patient        Signed by: Ulice Bold, MD, MD

## 2020-10-07 NOTE — Progress Notes (Signed)
Name of Group: Recreation Therapy   RT Intervention: Lyric Analysis  Purpose: To increase self-awareness and encourage expression of feelings in a healthy manner.   RT Goal: Prior to d/c, pt will ID min. 5 leisure coping skills as evidence by completing Leisure Coping Skills Plan to assist with increasing leisure awareness and managing emotions after d/c.  Pt Progress Toward Goal:  RT invited pt to group but pt declined stating "I don't feel well". He appeared to be anxious. RT encouraged pt to attend and explained to pt content of group. Pt was very appreciative and stated that if he feels better he will attend. RT will continue to encourage participation in groups. RT informed nurse that pt stated he was not feeling well.   Group offered:  Patients engaged in warm up activity by stating their name rating their current mood on a scale of 1-5, with 5 being very happy. RT introduced the group by providing education on the use of music as a tool to self-reflect and identify/express emotions. RT provided patients with the lyrics for each song and asked patients to actively listen to each song when its played. Following each song patients engaged in a group discussion.

## 2020-10-07 NOTE — Progress Notes (Addendum)
Pt. Is isolative to his room and having auditory hallucinations. Outside noises are bothering the pt. Worker left a VM for Orvil Feil at [phone] to extend the pt's lipos, worker asked for a return call.     Worker spoke to [employer] and she requested clinical information for the pt. To have approval for more lipos days. Her fax number is [phone]. Worker faxed Irving Burton the clinical information and she will fax this worker the extended lipos.     Worker scanned the updated lipos into [employer].

## 2020-10-08 LAB — BASIC METABOLIC PANEL
Anion Gap: 9 (ref 5.0–15.0)
BUN: 31 mg/dL — ABNORMAL HIGH (ref 9.0–28.0)
CO2: 21 mEq/L — ABNORMAL LOW (ref 22–29)
Calcium: 9 mg/dL (ref 8.5–10.5)
Chloride: 107 mEq/L (ref 100–111)
Creatinine: 1.3 mg/dL (ref 0.7–1.3)
Glucose: 115 mg/dL — ABNORMAL HIGH (ref 70–100)
Potassium: 3.9 mEq/L (ref 3.5–5.1)
Sodium: 137 mEq/L (ref 136–145)

## 2020-10-08 LAB — URIC ACID: Uric acid: 8.2 mg/dL (ref 3.6–9.7)

## 2020-10-08 LAB — COVID-19 (SARS-COV-2) & INFLUENZA A/B & RSV
Influenza A: NEGATIVE
Influenza B: NEGATIVE
Respiratory Syncytial Virus: NEGATIVE
SARS CoV 2 Overall Result: POSITIVE — AB

## 2020-10-08 LAB — GFR: EGFR: 56.4

## 2020-10-08 NOTE — Plan of Care (Signed)
Problem: Addiction to alcohol or opioids or other substances AS EVIDENCED BY:  Goal: Will identify long and short term goals based on individual needs and strengths  Outcome: Progressing     Problem: Addiction to alcohol or opioids or other substances AS EVIDENCED BY:  Goal: Patient achieves a safe detoxification and management of withdrawal symptoms  Outcome: Progressing     Problem: Addiction to alcohol or opioids or other substances AS EVIDENCED BY:  Goal: Patient educated about the disease of addiction and recovery and identifies long-term goal  Outcome: Progressing   Kristopher Rogers reports that the goal for today 10/08/2020 is to  "To take a nap and eat my breakfast." He is calm and cooperative with care, med compliant. At this time he denies SI/HI/AVH, anxiety or depression at th is time. He is following COVID 19  isolation protocol. He is alert and orientedx3-4 and pleasant upon approach and soft spoken. Safety maintained and continue to monitor for any changes.

## 2020-10-08 NOTE — Progress Notes (Signed)
Pt is on isolation precautions due to COVID infection. Provided pt supplemental leisure materials including word puzzles, sodoku, and coloring pages.

## 2020-10-08 NOTE — Plan of Care (Signed)
Problem: Addiction to alcohol or opioids or other substances AS EVIDENCED BY:  Goal: Will identify long and short term goals based on individual needs and strengths  Outcome: Not Progressing  Goal: Patient achieves a safe detoxification and management of withdrawal symptoms  Outcome: Progressing  Goal: Patient educated about the disease of addiction and recovery and identifies long-term goal  Outcome: Progressing  Goal: Patient develops a discharge plan  Outcome: Not Progressing     Problem: At Risk for Harm to Self: AS EVIDENCED BY...  Goal: Will identify long and short term goals based on individual needs and strengths  Outcome: Progressing  Goal: Reduction of self-harm thoughts and no attempts  Outcome: Progressing  Goal: Identifies stressors, protective factors, and coping strategies  Outcome: Progressing  Goal: Verbalizes understanding of medication, benefits, and side effects  Outcome: Progressing  Goal: Identify and participate in supportive program therapies  Outcome: Not Progressing  Goal: Completes discharge safety and recovery plan  Outcome: Not Progressing     Kristopher Rogers was received in bed asleep but easily arousable.  He is alert, anxious, calm, cooperative and is pleasant to staff.  Upon face to face - Kristopher Rogers is alert, no sign of respiratory distress observed, calm, pleasant and coherent. Kristopher Rogers was given tylenol 650mg  @ 2019 because his temperature was elevated.  He was rechecked after an hour and temperature was normal.  Kristopher Rogers took his scheduled medications as prompted.  He denies shortness of breath, cough or phlegm.  Kristopher Rogers was encouraged to increase fluid intake and take a lot of rest - he  verbalized understanding.  Kristopher Rogers denies suicidal and homicidal ideations.  Denies auditory and visual hallucinations at this time.  Encouraged to seek out staff if feeling anxious and depressed has worsen.  Encouraged to seek out staff for redirection.  Will continue to monitor every 15 minutes for  safety and comfort.

## 2020-10-08 NOTE — Plan of Care (Signed)
60 year old male with no prior psychiatric history hypertension, essential tremors, admitted to medicine service 12/31 through 1/2 as TDO for paranoid and suicidal ideations, possible alcohol withdrawal, AKI, and transferred to inpatient psychiatry on 1/2 Geri psychiatric treatment.  Medicine consulted on 1/6 for worsening renal function, with creatinine up to 1.7 from prior 1.1.  Recently initiated on colchicine and Advil for possible gout flare versus hand trauma. Colchicine and NSAID placed on hold and creatinine now improving.  Patient was found to be positive for COVID on 1/7 after being tested after potential exposure.    24-hour vitals reviewed.  Afebrile, tachycardic, blood pressure stable, on room air.  Labs with creatinine 1.3.    AKI, suspect related to NSAID, now resolved  COVID positive  Tremors  Hypertension  Acute psychosis  Hx gout  R hand trauma  BMP with improved creatinine  Patient reports history of gout, usually in the feet, ankles, knees. Uric acid level 8.2   Patient reports recent trauma to right hand with swelling, now improving. XR R hand 1/3 without evidence of acute fx or bony erosions   Incidentally found to be COVID-positive.  Patient is not hypoxic at this time.  Continue to monitor without COVID-specific treatments

## 2020-10-08 NOTE — Progress Notes (Signed)
Psychiatry Progress Note  (Level 1 = Problem Focused, Level 2 = Expanded Problem Focused, Level 3 = Detailed)    Patient Name: Kristopher Rogers            Current Date/Time:  1/8/20222:12 PM  MRN:  [id number]                            Attending Physician: Janace Aris, MD  DOB: July 08, 1961                                Gender: male                              I. History   Informants: Patient, chart review, staff  A. Chief Complaint or Reason for Admission       Anxious  B.History of Present Illness: Interval History     (Symptoms and qualifiers:1 for Level 1, 2 for Level 2 and 3+ for Level 3)    Patient is a 60 y.o. Caucasian male presenting with anxiety and fatigue complaints.  Found walking around in his room with a constricted affect, calm and cooperative. Reports that he did not sleep well last night thus feels tired this morning. He endorses anxiety, rates his anxiety 5/10 on a numeric scale with 10 being very anxious. He denies depression, thoughts of hurting self or others. He denies unsubstantiated fears, admits to being paranoid and anxious prior to admission. He inquired discharge and was focused on seeing wife.   TW shared covid -19 test results and need for contact and airborne precautions with Pt. He denies shortness of breath, myalgia, nausea, vomiting or decreased taste. Reports improving left hand pain.  His vital signs are stable.   Incidentally found to be covid positive last evening. No safety or behavioral issues reported from overnight.    C.Medical History  Review of Systems  (Level 1 is none, Level 2 is 1, and Level 3 is 2+)  Psychiatric: Anxiety  Constitutional: Fatigue  Respiratory: No complaints  Gastrointestinal: No complaints    D.Additional Past Psychiatric, Substance Use, Medical, Family and Social History  Psychiatric: No new information available as compared to previous encounter(s)  Substance Use: No new information available as compared to previous encounter(s)  Medical: No  new information available as compared to previous encounter(s)  Family: No new information available as compared to previous encounter(s)  Social: No new information available as compared to previous encounter(s)    Medications:   Current Medications  Report    Scheduled     Medication Ordered Dose/Rate, Route, Frequency Last Action    amLODIPine (NORVASC) tablet 10 mg 10 mg, PO, Daily Given, 10 mg at 01/08 0904    folic acid (FOLVITE) tablet 1 mg 1 mg, PO, Daily Given, 1 mg at 01/08 0904    multivitamin tablet 1 tablet 1 tablet, PO, Daily Given, 1 tablet at 01/08 0904    OLANZapine (ZyPREXA) tablet 15 mg 15 mg, PO, QHS Given, 15 mg at 01/07 2143    propranolol (INDERAL) tablet 20 mg 20 mg, PO, Q8H SCH Given, 20 mg at 01/08 0609    thiamine (VITAMIN B1) tablet 100 mg 100 mg, PO, Daily Given, 100 mg at 01/08 0904          PRN     Medication  Ordered Dose/Rate, Route, Frequency Last Action    acetaminophen (TYLENOL) suppository 650 mg (Or Linked Group #1) 650 mg, RE, Q6H PRN See Alternative, 01/03 0946    acetaminophen (TYLENOL) tablet 650 mg (Or Linked Group #1) 650 mg, PO, Q6H PRN Given, 650 mg at 01/03 0946    alum & mag hydroxide-simethicone (MAALOX PLUS) 200-200-20 mg/5 mL suspension 15 mL 15 mL, PO, Q4H PRN Given, 15 mL at 01/04 1703    haloperidol (HALDOL) tablet 5 mg 5 mg, PO, Q4H PRN Given, 5 mg at 01/03 0023    hydrALAZINE (APRESOLINE) injection 10 mg 10 mg, IV, Q6H PRN Ordered    lidocaine (LIDODERM) 5 % 1 patch 1 patch, TD, Q24H PRN Ordered    LORazepam (ATIVAN) tablet 1 mg 1 mg, PO, Q4H PRN Given, 1 mg at 01/08 0006    ondansetron (ZOFRAN) tablet 4 mg 4 mg, PO, Q8H PRN Given, 4 mg at 01/04 1703                II. Examination   Vital signs reviewed:   Blood pressure 135/61, pulse 65, temperature 98.1 F (36.7 C), temperature source Oral, resp. rate 18, height 1.803 m (5' 10.98"), weight 86.2 kg (190 lb 0.6 oz), SpO2 (!) 89 %.     Mental Status Exam  (Level 1 is 1-5, Level 2 is 6-8, Level 3 is 9+)  General  appearance: Appears chronological age, Good hygiene and Good grooming  Attitude/Behavior: Calm, Cooperative and Eye Contact is  Good  Motor: No abnormalities noted  Gait: No obvious abnormalities  Muscle strength and tone: Not tested  Speech:   Spontaneous: Yes  Rate and Rhythm: Normal  Volume: Normal  Tone: Normal  Clarity: Yes  Mood: anxious  Affect:   Range: Constricted  Stability: Stable  Appropriateness to thought content: Yes  Intensity: Normal  Thought Process:   Coherent: Yes  Logical:  Yes  Associations: Goal-directed  Thought Content:   Delusions:  Not elicited  Depressive Cognitions:  None  Suicidal:  No suicidal thoughts  Homicidal:  No homicidal thoughts  Violent Thoughts:  No  Perceptions:   Hallucinations: No  Insight: Poor  Judgment: Poor  Cognition:   Level of Consciousness: Intact  Orientation: Intact to self, place and time  Recent Memory: Intact    Psychiatric / Cognitive Instruments: None    Pertinent Physical Exam: Not relevant to current chief complaint/reason for admission    Imaging / EKG / Labs:   Labs in the last 24 hours  Results     Procedure Component Value Units Date/Time    Basic Metabolic Panel [[id number]]  (Abnormal) Collected: 10/08/20 0705    Specimen: Blood Updated: 10/08/20 0750     Glucose 115 mg/dL      BUN 71.0 mg/dL      Creatinine 1.3 mg/dL      Calcium 9.0 mg/dL      Sodium 626 mEq/L      Potassium 3.9 mEq/L      Chloride 107 mEq/L      CO2 21 mEq/L      Anion Gap 9.0    Uric acid [[id number]] Collected: 10/08/20 0705    Specimen: Blood Updated: 10/08/20 0750     Uric acid 8.2 mg/dL     GFR [[id number]] Collected: 10/08/20 0705     Updated: 10/08/20 0750     EGFR 56.4    COVID-19 (SARS-CoV-2) and Influenza A/B & RSV (Cepheid)- Age less than 5 [[id number]]  (Abnormal)  Collected: 10/07/20 2107    Specimen: Nasopharyngeal Swab Updated: 10/08/20 0034     Purpose of COVID testing Diagnostic -[facility]     SARS-CoV-2 Specimen Source Nasal Swab     SARS CoV 2 Overall Result Positive      Influenza A Negative     Influenza B Negative     Respiratory Syncytial Virus Negative    Narrative:      o Collect and clearly label specimen type:  o PREFERRED-Upper respiratory specimen: One Nasopharyngeal Swab in  Transport Media.  o Hand deliver to laboratory ASAP  Diagnostic -[facility]        EKG Results   Cardiology Results     Procedure Component Value Units Date/Time    ECG 12 lead [[id number]] Collected: 10/03/20 1418     Updated: 10/03/20 1442     Ventricular Rate 91 BPM      Atrial Rate 91 BPM      P-R Interval 158 ms      QRS Duration 68 ms      Q-T Interval 388 ms      QTC Calculation (Bezet) 477 ms      P Axis 55 degrees      R Axis -23 degrees      T Axis 41 degrees     Narrative:      NORMAL SINUS RHYTHM  NORMAL ECG  Confirmed by KOPIN, DAVID ([id number]) on 10/03/2020 2:42:48 PM            III. Assessment and Plan (Medical Decision Making)     1. I certify that this patient continues to require inpatient hospitalization due to unable to care for self in the community with insufficient support available    2. Psychiatric Diagnoses   Unspecified psychosis (ddx: schizophrenia vs substance (alcohol) induced vs schizoaffective)   Rule out Alcohol withdrawal complications presenting also with psychosis   Unspecified anxiety   Alcohol use disorder, severedependence   Rule outneurocognitivedisorder   History of Gout   HTN   Covid-19 Positive       3.All diagnostic procedures completed since admission were reviewed. Past medical records reviewed. Coordination of care was discussed with inpatient team and as available with the outpatient team.    4. On this admission patient educated about and provided input into their treatment plan.  Patient understands potential risks and benefits of proposed treatment plan.     5.  Assessment / Impression  Patient is a59 y.o.male, in a relationship, unstable housing (but reports apartment, daughter reports hotel), unclear current employment,with a history of anxiety  and alcohol use disorder that was initially admitted under a TDO but now on voluntary basis, in the setting of increased paranoia and SI over the last several weeks.Patient is a poor historian and appears to have deficits in his memory. He exhibits paranoid and grandiose delusions of being followed and has been observed reacting to internal stimuli since being admitted. Has some history of features of bipolar although he does not presently appear to be in a manic or depressive state. Psychosis seems to be his primary issue. He has no known history of withdrawal sx from alcohol and has been scoring low on CIWA since admission. It is unlikely that his current presentation is related to alcohol withdrawal although it cannot be fully ruled out at this time. Head CT w/o contrast did not show major intracranial abnormalities.     Patient with anxiety complaints this morning. AOx3, calm and cooperative. Denies all  other psych symptoms. Denies medication side effects. Incidentally found to be Covid positive last evening. He is asymptomatic. Vital signs are stable.  Will maintain on current medications.  Continue to monitor patient for changes and offer support.    Suicide Risk Assessment   Suicide Risk Assessment performed? Yes: Suicide Thoughts / Behaviors: None  Current Plan: None    Plan / Recommendations:    Biological Plan:  Medications:   Continue  Current Facility-Administered Medications   Medication Dose Route Frequency    amLODIPine  10 mg Oral Daily    folic acid  1 mg Oral Daily    multivitamin  1 tablet Oral Daily    OLANZapine  15 mg Oral QHS    propranolol  20 mg Oral Q8H SCH    thiamine  100 mg Oral Daily     Medical Work-up: None required at this time  Consults: None required at this time    Psychosocial Plan:  Individual therapy: Psycho-education and psychotherapy of the following modalities will continue to be provided during daily visits:Supportive and Insight Oriented Psychotherapy  Group and  milieu therapies: daily per unit's schedule.  Continue with Social Work intervention provided for discharge planning including assistance with case management referral and follow-up psychiatric care.    Plan for Family Involvement: Update as needed  Other Providers Contact Information and Dates Contacted: No Updates    Total Attending time spent 25 minutes (floor time) with more than 50 percent of time in direct patient contact, coordinating care and counseling.    Signed by: Ruffin Jihaad Bruschi, DNP NP  10/08/2020  2:12 PM

## 2020-10-08 NOTE — Progress Notes (Signed)
Checklist instructions: Score the patient once a shift and as needed. Absence of a behavior results in a score of 0. Presence or increase in the display of a behavior results in a score of 1. Maximum score (SUM) possible is 6. If a behavior is at baseline for a patient, e.g. the patient is normally confused, this will result in a score of 0. An increase in confusion will result in a score of 1.      Kristopher Rogers is being assessed under the [place]set Violence Checklist for any escalations in potentially violent or harmful behavior.     Date of Admission:    10/02/2020            Date of Assessment:    Assessment Time 2000      Confused 0      Irritable 0      Boisterous 0      Verbal Threats 0      Physical Threats 0      Attacking Objects 0      SUM 0          TW attempted the following interventions based on the patient's sum total score assessed on the BVC: 1= Monitoring.      Should the patient require multiple interventions or fails to engage in de-escalation with staff, please review the plan for managing the care of an escalated patient.  Please do the following:  1.  Immediately notify Security and Nursing Administrative Supervisor/Director to respond  2.  Conduct team huddle to rule out clinical causes for escalated behavior  3.  Review the note authored by the Associate Professor for additional information  4.  Document specific behaviors observed  5.  Consider SAFE Team call for additional resources              The Broset Violence Checklist (BVC) is a 6 item inventory that was designed to assist in the prediction of imminent violent behavior (24 hours perspective) in healthcare and other sectors where workplace violence is acknowledged as a serious problem.   The BVC does not give instructions on what to do or what interventions to provide when a violent episode occurs; it is a took meant to aid in the risk assessment process. Interventions are based on the individual, the environment, and cultural  sensitivity indicators.

## 2020-10-08 NOTE — Progress Notes (Signed)
Checklist instructions: Score the patient once a shift and as needed. Absence of a behavior results in a score of 0. Presence or increase in the display of a behavior results in a score of 1. Maximum score (SUM) possible is 6. If a behavior is at baseline for a patient, e.g. the patient is normally confused, this will result in a score of 0. An increase in confusion will result in a score of 1.      Yuepheng Jaquavis Felmlee is being assessed under the Broset Violence Checklist for any escalations in potentially violent or harmful behavior.     Date of Admission:                                                                         Date of Assessment:    Assessment Time 0800      Confused 0      Irritable 0      Boisterous 0      Verbal Threats 0      Physical Threats 0      Attacking Objects 0      SUM 0          TW attempted the following interventions based on the patient's sum total score assessed on the BVC: 1= Monitoring.      Should the patient require multiple interventions or fails to engage in de-escalation with staff, please review the plan for managing the care of an escalated patient.  Please do the following:  1.  Immediately notify Security and Nursing Administrative Supervisor/Director to respond  2.  Conduct team huddle to rule out clinical causes for escalated behavior  3.  Review the note authored by the Associate Professor for additional information  4.  Document specific behaviors observed  5.  Consider SAFE Team call for additional resources              The Broset Violence Checklist (BVC) is a 6 item inventory that was designed to assist in the prediction of imminent violent behavior (24 hours perspective) in healthcare and other sectors where workplace violence is acknowledged as a serious problem.   The BVC does not give instructions on what to do or what interventions to provide when a violent episode occurs; it is a took meant to aid in the risk assessment process. Interventions are based on the  individual, the environment, and cultural sensitivity indicators.

## 2020-10-08 NOTE — Progress Notes (Signed)
Checklist instructions: Score the patient once a shift and as needed. Absence of a behavior results in a score of 0. Presence or increase in the display of a behavior results in a score of 1. Maximum score (SUM) possible is 6. If a behavior is at baseline for a patient, e.g. the patient is normally confused, this will result in a score of 0. An increase in confusion will result in a score of 1.      Kristopher Rogers is being assessed under the Broset Violence Checklist for any escalations in potentially violent or harmful behavior.     Date of Admission:                                                                         Date of Assessment:    Assessment Time 2000      Confused 0      Irritable 0      Boisterous 0      Verbal Threats 0      Physical Threats 0      Attacking Objects 0      SUM 0          TW attempted the following interventions based on the patient's sum total score assessed on the BVC: 1= Monitoring.      Should the patient require multiple interventions or fails to engage in de-escalation with staff, please review the plan for managing the care of an escalated patient.  Please do the following:  1.  Immediately notify Security and Nursing Administrative Supervisor/Director to respond  2.  Conduct team huddle to rule out clinical causes for escalated behavior  3.  Review the note authored by the Associate Professor for additional information  4.  Document specific behaviors observed  5.  Consider SAFE Team call for additional resources              The Broset Violence Checklist (BVC) is a 6 item inventory that was designed to assist in the prediction of imminent violent behavior (24 hours perspective) in healthcare and other sectors where workplace violence is acknowledged as a serious problem.   The BVC does not give instructions on what to do or what interventions to provide when a violent episode occurs; it is a took meant to aid in the risk assessment process. Interventions are based on the  individual, the environment, and cultural sensitivity indicators.

## 2020-10-09 MED ORDER — BENZOCAINE-MENTHOL 15-3.6 MG MT LOZG
1.0000 | LOZENGE | OROMUCOSAL | Status: DC | PRN
Start: 2020-10-09 — End: 2020-10-10

## 2020-10-09 NOTE — Progress Notes (Signed)
Edema was noted on his right hand between his 2nd and 3rd digit, and his left foot.  Kristopher Rogers denies pain on his left foot but states that his right hand feels uncomfortable r/t injury.   Kristopher Rogers was encouraged to elevate his legs when in bed and seek out staff if pain or foot has become uncomfortable.  Encouraged to refrain using his right hand and ask for assistance if needed.  He was given a cold compress for his hand - he was offered a pain medication but refused. Charge nurse Rosalita Chessman is aware.

## 2020-10-09 NOTE — Plan of Care (Signed)
60 year old male with no prior psychiatric history hypertension, essential tremors, admitted to medicine service 12/31 through 1/2 as TDO for paranoid and suicidal ideations, possible alcohol withdrawal, AKI, and transferred to inpatient psychiatry on 1/2 [facility] psychiatric treatment.  Medicine consulted on 1/6 for worsening renal function, with creatinine up to 1.7 from prior 1.1.  Recently initiated on colchicine and Advil for possible gout flare versus hand trauma. Colchicine and NSAID placed on hold and creatinine now improving.  Patient was found to be positive for COVID on 1/7 after being tested after potential exposure.  Febrile on 1/8.    Vitals with T-max 101.3, intermittent tachycardia, blood pressure elevated at times.  On room air.  No new labs.    AKI, suspect related to NSAID, now resolved  SIRS criteria, suspect secondary to COVID  COVID positive  Tremors  Hypertension  Acute psychosis  Hx gout  R hand trauma  Febrile over last 24 hours, suspect secondary to COVID.  Will check a.m. labs tomorrow.  Is not hypoxic from COVID standpoint, therefore will defer any COVID-specific treatments.  If he develops hypoxia, will check chest x-ray and consider dexamethasone and remdesivir treatment  BMP with improved creatinine  Patient reports history of gout, usually in the feet, ankles, knees. Uric acid level 8.2   Patient reports recent trauma to right hand with swelling, now improving. XR R hand 1/3 without evidence of acute fx or bony erosions   Medicine will continue to follow.  Please call with questions

## 2020-10-09 NOTE — Plan of Care (Signed)
Problem: Addiction to alcohol or opioids or other substances AS EVIDENCED BY:  Goal: Will identify long and short term goals based on individual needs and strengths  Outcome: Progressing  Goal: Patient achieves a safe detoxification and management of withdrawal symptoms  Outcome: Progressing  Goal: Patient educated about the disease of addiction and recovery and identifies long-term goal  Outcome: Progressing  Goal: Patient develops a discharge plan  Outcome: Not Progressing     Kazuki was received in bed asleep but easily arousable.  Ashraf is alert, anxious, occasionally confused, delusional, bizarre, cooperative and pleasant to staff.  Caswell took his medications as prompted.  He denies shortness of breath, cough, phlegm and body aches.  Davell was observed to be going in his bathroom very often and stay in the shower - and just standing there behind the curtains.  He states that he was "intensely praying".  He states he saw his wife walked in the hallway earlier and was expecting her to see him soon.  He was redirected and was told that our unit does not allowing visitations at this time and what he saw was not his wife - he verbalized understanding and went to bed.  He denies suicidal and homicidal ideations.  Denies auditory and visual hallucinations.  Asmar was reminded to increase fluid intake and always wash his hands.  Encouraged to seek out staff if feeling anxious and confused has worsen.  Encouraged to seek out staff for redirection.  Will continue to monitor every 15 minutes for safety and comfort.

## 2020-10-09 NOTE — Progress Notes (Signed)
Psychiatry Progress Note  (Level 1 = Problem Focused, Level 2 = Expanded Problem Focused, Level 3 = Detailed)    Patient Name: Kristopher Rogers            Current Date/Time:  1/9/202212:43 PM  MRN:  [id number]                            Attending Physician: Janace Aris, MD  DOB: April 27, 1961                                Gender: male                              I. History   Informants: Patient, chart reviewed, staff  A. Chief Complaint or Reason for Admission      Sore throat  B.History of Present Illness: Interval History     (Symptoms and qualifiers:1 for Level 1, 2 for Level 2 and 3+ for Level 3)    Patient is a 60 y.o. Caucasian male presenting with mild sore throat with complaints this morning.  Found sitting at the edge of his bed with a constricted affect, calm and cooperative.  Patient reports being in a good mood and slept well last night.  He rates his anxiety at 2/10 on a numeric scale with 10 being very anxious.  He denies thoughts of hurting self or others.  He denies hearing voices or seeing things that others cannot hear or see.  He denies medication side effects.  No safety or behavior issues reported from overnight.  Noted to be febrile last evening;101.89F, was offered Tylenol 650mg  with desired outcomes.  He denies chest pain, myalgia, dizziness or shortness of breath.    C.Medical History  Review of Systems  (Level 1 is none, Level 2 is 1, and Level 3 is 2+)  Psychiatric: Improving anxiety  Constitutional: No complaints  Ears/Nose/Mouth/Throat: Sore throat  Respiratory: No complaints  Gastrointestinal: No complaints    D.Additional Past Psychiatric, Substance Use, Medical, Family and Social History  Psychiatric: No new information available as compared to previous encounter(s)  Substance Use: No new information available as compared to previous encounter(s)  Medical: No new information available as compared to previous encounter(s)  Family: No new information available as compared to previous  encounter(s)  Social: No new information available as compared to previous encounter(s)    Medications:   Current Medications  Report    Scheduled     Medication Ordered Dose/Rate, Route, Frequency Last Action    amLODIPine (NORVASC) tablet 10 mg 10 mg, PO, Daily Given, 10 mg at 01/09 0827    folic acid (FOLVITE) tablet 1 mg 1 mg, PO, Daily Given, 1 mg at 01/09 0827    multivitamin tablet 1 tablet 1 tablet, PO, Daily Given, 1 tablet at 01/09 0827    OLANZapine (ZyPREXA) tablet 15 mg 15 mg, PO, QHS Given, 15 mg at 01/08 2201    propranolol (INDERAL) tablet 20 mg 20 mg, PO, Q8H SCH Given, 20 mg at 01/09 0526    thiamine (VITAMIN B1) tablet 100 mg 100 mg, PO, Daily Given, 100 mg at 01/09 0827          PRN     Medication Ordered Dose/Rate, Route, Frequency Last Action    acetaminophen (TYLENOL) suppository 650 mg (Or  Linked Group #1) 650 mg, RE, Q6H PRN See Alternative, 01/08 2019    acetaminophen (TYLENOL) tablet 650 mg (Or Linked Group #1) 650 mg, PO, Q6H PRN Given, 650 mg at 01/08 2019    alum & mag hydroxide-simethicone (MAALOX PLUS) 200-200-20 mg/5 mL suspension 15 mL 15 mL, PO, Q4H PRN Given, 15 mL at 01/04 1703    haloperidol (HALDOL) tablet 5 mg 5 mg, PO, Q4H PRN Given, 5 mg at 01/09 0545    hydrALAZINE (APRESOLINE) injection 10 mg 10 mg, IV, Q6H PRN Ordered    lidocaine (LIDODERM) 5 % 1 patch 1 patch, TD, Q24H PRN Ordered    LORazepam (ATIVAN) tablet 1 mg 1 mg, PO, Q4H PRN Given, 1 mg at 01/08 2019    ondansetron (ZOFRAN) tablet 4 mg 4 mg, PO, Q8H PRN Given, 4 mg at 01/04 1703                II. Examination   Vital signs reviewed:   Blood pressure 126/74, pulse 97, temperature 99.5 F (37.5 C), temperature source Oral, resp. rate 18, height 1.803 m (5' 10.98"), weight 86.2 kg (190 lb 0.6 oz), SpO2 97 %.     Mental Status Exam  (Level 1 is 1-5, Level 2 is 6-8, Level 3 is 9+)  General appearance: Appears chronological age, Good hygiene and Good grooming  Attitude/Behavior: Calm, Cooperative and Eye Contact is   Good  Motor: No abnormalities noted  Gait: No obvious abnormalities  Muscle strength and tone: Not tested  Speech:   Spontaneous: Yes  Rate and Rhythm: Normal  Volume: Normal  Tone: Normal  Clarity: Yes  Mood: good  Affect:   Range: Constricted  Stability: Stable  Appropriateness to thought content: Yes  Intensity: Normal  Thought Process:   Coherent: Yes  Logical:  Yes  Associations: Goal-directed  Thought Content:   Delusions:  Not elicited  Depressive Cognitions:  None  Suicidal:  No suicidal thoughts  Homicidal:  No homicidal thoughts  Violent Thoughts:  No  Perceptions:   Hallucinations: No  Insight: Poor  Judgment: Poor  Cognition:   Level of Consciousness: Intact  Orientation: Intact to self, place and time  Recent Memory: Intact    Psychiatric / Cognitive Instruments: None    Pertinent Physical Exam: Not relevant to current chief complaint/reason for admission    Imaging / EKG / Labs:   Labs in the last 24 hours  Results     ** No results found for the last 24 hours. **        EKG Results   Cardiology Results     Procedure Component Value Units Date/Time    ECG 12 lead [[id number]] Collected: 10/03/20 1418     Updated: 10/03/20 1442     Ventricular Rate 91 BPM      Atrial Rate 91 BPM      P-R Interval 158 ms      QRS Duration 68 ms      Q-T Interval 388 ms      QTC Calculation (Bezet) 477 ms      P Axis 55 degrees      R Axis -23 degrees      T Axis 41 degrees     Narrative:      NORMAL SINUS RHYTHM  NORMAL ECG  Confirmed by KOPIN, DAVID ([id number]) on 10/03/2020 2:42:48 PM            III. Assessment and Plan (Medical Decision Making)  1. I certify that this patient continues to require inpatient hospitalization due to unable to care for self in the community with insufficient support available    2. Psychiatric Diagnoses   Unspecified psychosis (ddx: schizophrenia vs substance (alcohol) induced vs schizoaffective)   Rule out Alcohol withdrawal complications presenting also with psychosis   Unspecified  anxiety   Alcohol use disorder, severedependence   Rule outneurocognitivedisorder   History of Gout   HTN   Covid-19 Positive    3.All diagnostic procedures completed since admission were reviewed. Past medical records reviewed. Coordination of care was discussed with inpatient team and as available with the outpatient team.    4. On this admission patient educated about and provided input into their treatment plan.  Patient understands potential risks and benefits of proposed treatment plan.     5.  Assessment / Impression  Patient is a59 y.o.male, in a relationship, unstable housing(but reports apartment, daughter reports hotel), unclear current employment,with a history of anxiety and alcohol use disorder that was initially admitted under a TDO but now on voluntary basis, in the setting of increased paranoia and SI over the last several weeks.Patient is a poor historian and appears to have deficits in his memory. He exhibits paranoid and grandiose delusions of being followed and has been observed reacting to internal stimuli since being admitted. Has some history of features of bipolar although he does not presently appear to be in a manic or depressive state. Psychosis seems to be his primary issue. He has no known history of withdrawal sx from alcohol and has been scoring low on CIWA since admission. It is unlikely that his current presentation is related to alcohol withdrawal although it cannot be fully ruled out at this time. Head CT w/o contrast did not show major intracranial abnormalities.     Patient sore throat complaints this morning. Reports improving anxiety, denies suicidal or homicidal ideation. He denies paranoia or hallucinations. Denies medication side effects.  Pt was febrile overnight, received tylenol with desired outcomes. Denies SOB, myalgia, nausea, vomiting or diarrhea.  Will maintain on current medications.  Continue to monitor patient for changes and offer  support.      Suicide Risk Assessment   Suicide Risk Assessment performed? Yes: Suicide Thoughts / Behaviors: None  Current Plan: None    Plan / Recommendations:    Biological Plan:  Medications:   Current Facility-Administered Medications   Medication Dose Route Frequency    amLODIPine  10 mg Oral Daily    folic acid  1 mg Oral Daily    multivitamin  1 tablet Oral Daily    OLANZapine  15 mg Oral QHS    propranolol  20 mg Oral Q8H SCH    thiamine  100 mg Oral Daily     Medical Work-up: None required at this time  Consults: None required at this time    Psychosocial Plan:  Individual therapy: Psycho-education and psychotherapy of the following modalities will continue to be provided during daily visits:Supportive and Insight Oriented Psychotherapy  Group and milieu therapies: daily per unit's schedule.  Continue with Social Work intervention provided for discharge planning including assistance with case management referral and follow-up psychiatric care.    Plan for Family Involvement: Update as needed  Other Providers Contact Information and Dates Contacted: No Updates    Total Attending time spent 25 minutes (floor time) with more than 50 percent of time in direct patient contact, coordinating care and counseling.    Signed by:  Ruffin Jerre Vandrunen, DNP NP  10/09/2020  12:43 PM

## 2020-10-09 NOTE — Plan of Care (Addendum)
Problem: Addiction to alcohol or opioids or other substances AS EVIDENCED BY:  Goal: Will identify long and short term goals based on individual needs and strengths  Outcome: Progressing     Problem: Addiction to alcohol or opioids or other substances AS EVIDENCED BY:  Goal: Patient achieves a safe detoxification and management of withdrawal symptoms  Outcome: Progressing     Problem: Addiction to alcohol or opioids or other substances AS EVIDENCED BY:  Goal: Patient educated about the disease of addiction and recovery and identifies long-term goal  Outcome: Progressing   Kristopher Rogers reports that the goal for today 10/09/2020 is to get his computer and make phone calls. He was calm and cooperative with care, med compliant and denies SI/HI/AVH, anxiety or depression at this time. At one point this AM he came out to get water but he was redirected to his room and got him water due to his insolation for Covid-19 protocol. He makes his needs know, safety maintained and continue to monitor for any changes.      Later on he rung the call light and Clinical research associate responded and he was saying I need to go home now, I work for [employer] and last night 100 people were killed in this hospital including my girlfriend, emotional support was given and assured his safety and safety of others and he is easily redirectable.

## 2020-10-09 NOTE — Progress Notes (Signed)
Checklist instructions: Score the patient once a shift and as needed. Absence of a behavior results in a score of 0. Presence or increase in the display of a behavior results in a score of 1. Maximum score (SUM) possible is 6. If a behavior is at baseline for a patient, e.g. the patient is normally confused, this will result in a score of 0. An increase in confusion will result in a score of 1.      Kristopher Rogers is being assessed under the Broset Violence Checklist for any escalations in potentially violent or harmful behavior.     Date of Admission:                                                                         Date of Assessment:    Assessment Time 2000      Confused 1      Irritable 0      Boisterous 0      Verbal Threats 0      Physical Threats 0      Attacking Objects 0      SUM 1          TW attempted the following interventions based on the patient's sum total score assessed on the BVC: 1= Monitoring.      Should the patient require multiple interventions or fails to engage in de-escalation with staff, please review the plan for managing the care of an escalated patient.  Please do the following:  1.  Immediately notify Security and Nursing Administrative Supervisor/Director to respond  2.  Conduct team huddle to rule out clinical causes for escalated behavior  3.  Review the note authored by the Associate Professor for additional information  4.  Document specific behaviors observed  5.  Consider SAFE Team call for additional resources              The Broset Violence Checklist (BVC) is a 6 item inventory that was designed to assist in the prediction of imminent violent behavior (24 hours perspective) in healthcare and other sectors where workplace violence is acknowledged as a serious problem.   The BVC does not give instructions on what to do or what interventions to provide when a violent episode occurs; it is a took meant to aid in the risk assessment process. Interventions are based on the  individual, the environment, and cultural sensitivity indicators.

## 2020-10-09 NOTE — Progress Notes (Signed)
Checklist instructions: Score the patient once a shift and as needed. Absence of a behavior results in a score of 0. Presence or increase in the display of a behavior results in a score of 1. Maximum score (SUM) possible is 6. If a behavior is at baseline for a patient, e.g. the patient is normally confused, this will result in a score of 0. An increase in confusion will result in a score of 1.      Kristopher Rogers is being assessed under the Broset Violence Checklist for any escalations in potentially violent or harmful behavior.     Date of Admission:                                                                         Date of Assessment:    Assessment Time 0800      Confused 0      Irritable 0      Boisterous 0      Verbal Threats 0      Physical Threats 0      Attacking Objects 0      SUM 0          TW attempted the following interventions based on the patient's sum total score assessed on the BVC: 1= Monitoring.      Should the patient require multiple interventions or fails to engage in de-escalation with staff, please review the plan for managing the care of an escalated patient.  Please do the following:  1.  Immediately notify Security and Nursing Administrative Supervisor/Director to respond  2.  Conduct team huddle to rule out clinical causes for escalated behavior  3.  Review the note authored by the RN or Team Member for additional information  4.  Document specific behaviors observed  5.  Consider SAFE Team call for additional resources              The Broset Violence Checklist (BVC) is a 6 item inventory that was designed to assist in the prediction of imminent violent behavior (24 hours perspective) in healthcare and other sectors where workplace violence is acknowledged as a serious problem.   The BVC does not give instructions on what to do or what interventions to provide when a violent episode occurs; it is a took meant to aid in the risk assessment process. Interventions are based on the  individual, the environment, and cultural sensitivity indicators.

## 2020-10-10 LAB — COMPREHENSIVE METABOLIC PANEL
ALT: 11 U/L (ref 0–55)
AST (SGOT): 17 U/L (ref 5–34)
Albumin/Globulin Ratio: 1 (ref 0.9–2.2)
Albumin: 2.8 g/dL — ABNORMAL LOW (ref 3.5–5.0)
Alkaline Phosphatase: 54 U/L (ref 38–106)
Anion Gap: 12 (ref 5.0–15.0)
BUN: 18 mg/dL (ref 9.0–28.0)
Bilirubin, Total: 0.3 mg/dL (ref 0.2–1.2)
CO2: 21 mEq/L — ABNORMAL LOW (ref 22–29)
Calcium: 8.5 mg/dL (ref 8.5–10.5)
Chloride: 103 mEq/L (ref 100–111)
Creatinine: 0.7 mg/dL (ref 0.7–1.3)
Globulin: 2.9 g/dL (ref 2.0–3.6)
Glucose: 105 mg/dL — ABNORMAL HIGH (ref 70–100)
Potassium: 3.8 mEq/L (ref 3.5–5.1)
Protein, Total: 5.7 g/dL — ABNORMAL LOW (ref 6.0–8.3)
Sodium: 136 mEq/L (ref 136–145)

## 2020-10-10 LAB — CBC
Absolute NRBC: 0 10*3/uL (ref 0.00–0.00)
Hematocrit: 35.7 % — ABNORMAL LOW (ref 37.6–49.6)
Hgb: 11.8 g/dL — ABNORMAL LOW (ref 12.5–17.1)
MCH: 33 pg (ref 25.1–33.5)
MCHC: 33.1 g/dL (ref 31.5–35.8)
MCV: 99.7 fL — ABNORMAL HIGH (ref 78.0–96.0)
MPV: 9.5 fL (ref 8.9–12.5)
Nucleated RBC: 0 /100 WBC (ref 0.0–0.0)
Platelets: 192 10*3/uL (ref 142–346)
RBC: 3.58 10*6/uL — ABNORMAL LOW (ref 4.20–5.90)
RDW: 12 % (ref 11–15)
WBC: 4.7 10*3/uL (ref 3.10–9.50)

## 2020-10-10 LAB — GFR: EGFR: >60

## 2020-10-10 MED ORDER — GUAIFENESIN-CODEINE 100-10 MG/5ML PO SYRP
5.0000 mL | ORAL_SOLUTION | Freq: Four times a day (QID) | ORAL | Status: DC | PRN
Start: 2020-10-10 — End: 2020-10-21
  Administered 2020-10-11 – 2020-10-12 (×2): 5 mL via ORAL
  Filled 2020-10-10 (×6): qty 5

## 2020-10-10 MED ORDER — DEXTROMETHORPHAN-GUAIFENESIN ER 30-600 MG PO TB12
1.0000 | ORAL_TABLET | Freq: Two times a day (BID) | ORAL | Status: DC | PRN
Start: 2020-10-10 — End: 2020-10-21
  Administered 2020-10-10: 11:00:00 1 via ORAL
  Filled 2020-10-10 (×2): qty 1

## 2020-10-10 MED ORDER — BENZOCAINE-MENTHOL 15-3.6 MG MT LOZG
1.0000 | LOZENGE | OROMUCOSAL | Status: DC | PRN
Start: 2020-10-10 — End: 2020-10-21
  Administered 2020-10-11 – 2020-10-14 (×4): 1 via BUCCAL
  Filled 2020-10-10 (×4): qty 1

## 2020-10-10 MED ORDER — BENZONATATE 100 MG PO CAPS
100.0000 mg | ORAL_CAPSULE | Freq: Three times a day (TID) | ORAL | Status: DC | PRN
Start: 2020-10-10 — End: 2020-10-21
  Filled 2020-10-10 (×5): qty 1

## 2020-10-10 NOTE — Progress Notes (Signed)
Worker was told that the pt. May discharge in 2-3 days to the covid hotel. Pt. Is also due for an extension for his lipos. Worker left a VM for Orvil Feil, Lipos Coordinator, at [phone] and asked for a return call.

## 2020-10-10 NOTE — Progress Notes (Signed)
Checklist instructions: Score the patient once a shift and as needed. Absence of a behavior results in a score of 0. Presence or increase in the display of a behavior results in a score of 1. Maximum score (SUM) possible is 6. If a behavior is at baseline for a patient, e.g. the patient is normally confused, this will result in a score of 0. An increase in confusion will result in a score of 1.      Kristopher Rogers is being assessed under the Broset Violence Checklist for any escalations in potentially violent or harmful behavior.     Date of Admission:                                                                         Date of Assessment:  10/10/2020   Assessment Time 20:00      Confused 0      Irritable 0      Boisterous 0      Verbal Threats 0      Physical Threats 0      Attacking Objects 0      SUM 0          TW attempted the following interventions based on the patient's sum total score assessed on the BVC: 1= Monitoring.      Should the patient require multiple interventions or fails to engage in de-escalation with staff, please review the plan for managing the care of an escalated patient.  Please do the following:  1.  Immediately notify Security and Nursing Administrative Supervisor/Director to respond  2.  Conduct team huddle to rule out clinical causes for escalated behavior  3.  Review the note authored by the Associate Professor for additional information  4.  Document specific behaviors observed  5.  Consider SAFE Team call for additional resources              The Broset Violence Checklist (BVC) is a 6 item inventory that was designed to assist in the prediction of imminent violent behavior (24 hours perspective) in healthcare and other sectors where workplace violence is acknowledged as a serious problem.   The BVC does not give instructions on what to do or what interventions to provide when a violent episode occurs; it is a took meant to aid in the risk assessment process. Interventions are  based on the individual, the environment, and cultural sensitivity indicators.

## 2020-10-10 NOTE — Progress Notes (Addendum)
MEDICINE CONSULT FOLLOW-UP    Date Time: 10/10/20 10:57 AM  Patient Name: Kristopher Rogers  Attending Physician: Janace Aris, MD  Consulting Physician: Ulice Bold, MD, MD      Assessment:     Patient Active Problem List    Diagnosis Date Noted    AKI (acute kidney injury) 10/07/2020    Suicidal behavior with attempted self-injury 10/03/2020    Bipolar I disorder, most recent episode manic, severe with psychotic features 10/03/2020    Alcohol withdrawal syndrome with complication 09/30/2020     60 year old male with no prior psychiatric history hypertension, essential tremors, admitted to medicine service 12/31 through 1/2 as TDO for paranoid and suicidal ideations, possible alcohol withdrawal, AKI, and transferred to inpatient psychiatry on 1/2 Ger[facility] psychiatric treatment. Medicine consulted on 1/6 for worsening renal function, with creatinine up to 1.7 from prior 1.1. Recently initiated on colchicine and Advil for possible gout flare versus hand trauma. Colchicine and NSAID placed on hold and creatinine now improving.  Patient was found to be positive for COVID on 1/7 after being tested after potential exposure.  Febrile on 1/8.    Recommendations:   AKI, suspect related to NSAID, now resolved  SIRS criteria, suspect secondary to COVID, resolved  COVID positive, mildly symptomatic  Tremors  Hypertension  Acute psychosis  Hx gout  R hand trauma  Febrile likely secondary to COVID, now resolved.    Labs are stable.  Is not hypoxic from COVID standpoint, therefore will defer any COVID-specific treatments.  If he develops hypoxia, will check chest x-ray and consider dexamethasone and remdesivir treatment  Addition of throat lozenges and cough medications for symptomatic relief  BMPwith improved creatinine  Patient reports history of gout, usually in the feet, ankles, knees. Uric acid level 8.2  Patient reports recent trauma to right hand with swelling, now improving. XR R hand 1/3 without  evidence of acute fx or bony erosions  Medicine will continue to follow.  Please call with questions        CC: follow-up: medical comanagement    HPI/Subjective:Afebrile with vital stable on room air.  Labs reviewed and unremarkable.  Patient reports dry chronic cough and request cough medication.  Reports sore throat and requests throat lozenge.  Reports developing some nasal congestion as well.  No shortness of breath.  Right hand pain is significantly improved with just some residual soreness.    Review of Systems:   Review of Systems - Negative except as noted in HPI        Physical Exam:     Patient Vitals for the past 24 hrs:   BP Temp Temp src Pulse Resp SpO2   10/10/20 0743 130/78 97.9 F (36.6 C) Oral 76  96 %   10/10/20 0609 161/76 98.8 F (37.1 C) Oral 77  96 %   10/10/20 0035 133/75 98.4 F (36.9 C) Oral 81 17 96 %   10/09/20 1956 129/79 99.9 F (37.7 C) Oral 91 20 92 %   10/09/20 1418 123/87 98.4 F (36.9 C) Oral (!) 116  97 %     Body mass index is 26.52 kg/m.  No intake or output data in the 24 hours ending 10/10/20 1057    General: awake, alert,  no acute distress.  Lungs: Respirations are unlabored at rest  Extremities: Right hand swelling with significant improvement, no erythema noted    Meds:     Medications were reviewed:    Labs:     Recent  Labs     10/10/20  0701   WBC 4.70   Hgb 11.8*   Hematocrit 35.7*   Platelets 192       Recent Labs     10/10/20  0701 10/08/20  0705   Sodium 136 137   Potassium 3.8 3.9   Chloride 103 107   CO2 21* 21*   BUN 18.0 31.0*   Creatinine 0.7 1.3   Glucose 105* 115*   Calcium 8.5 9.0       Recent Labs     10/10/20  0701   AST (SGOT) 17   ALT 11   Alkaline Phosphatase 54   Protein, Total 5.7*   Albumin 2.8*       No results for input(s): PTT, PT, INR in the last 72 hours.    Imaging personally reviewed, including: no new imaging          Case discussed with: patient        Signed by: Ulice Bold, MD, MD

## 2020-10-10 NOTE — Progress Notes (Addendum)
No RT group provided today, 1/10, due to COVID precautions. RT provided pt with a packet of worksheets/activities to assist with reducing anxiety, reducing boredom and to promote the use of leisure as a healthy coping skill. RT provided patient with the following activities: leisure plan, protective factors therapy worksheet, word search, brain teasers, coloring pages and crayons.  RT will follow up with pt tomorrow to monitor progress on worksheets and to provide a 1:1 RT session. Pt was informed that RT will be seeing them tomorrow, 1/11. Pt expressed excitement about the opportunity to listen to music during the 1:1 session.

## 2020-10-10 NOTE — Plan of Care (Addendum)
Problem: Addiction to alcohol or opioids or other substances AS EVIDENCED BY:  Goal: Patient achieves a safe detoxification and management of withdrawal symptoms  Outcome: Progressing  Goal: Patient educated about the disease of addiction and recovery and identifies long-term goal  Outcome: Progressing     Problem: At Risk for Harm to Self: AS EVIDENCED BY...  Goal: Reduction of self-harm thoughts and no attempts  Outcome: Progressing  Goal: Verbalizes understanding of medication, benefits, and side effects  Outcome: Progressing   Ansh  Is alert oriented x2 pleasant  and receptive on approach report  His mood as good.report sleeping  And eating well.has been calm and cooperative with care.behaviour is bizarre   And delusional,denies si/hi/avh.adherent with medications.mucinex given for intermittent cough.observed patient shaking and endorsed feeling anxious,he has remain in his room all shift,afebrile q 15 minutes rounding in progress,iso;lation precautions maintained.

## 2020-10-10 NOTE — Progress Notes (Signed)
Checklist instructions: Score the patient once a shift and as needed. Absence of a behavior results in a score of 0. Presence or increase in the display of a behavior results in a score of 1. Maximum score (SUM) possible is 6. If a behavior is at baseline for a patient, e.g. the patient is normally confused, this will result in a score of 0. An increase in confusion will result in a score of 1.      Kristopher Rogers is being assessed under the Broset Violence Checklist for any escalations in potentially violent or harmful behavior.     Date of Admission:               10/02/20                                                          Date of Assessment:    Assessment Time 0800      Confused 0      Irritable 0      Boisterous 0      Verbal Threats 0      Physical Threats 0      Attacking Objects 0      SUM 0          TW attempted the following interventions based on the patient's sum total score assessed on the BVC: 1= Monitoring.      Should the patient require multiple interventions or fails to engage in de-escalation with staff, please review the plan for managing the care of an escalated patient.  Please do the following:  1.  Immediately notify Security and Nursing Administrative Supervisor/Director to respond  2.  Conduct team huddle to rule out clinical causes for escalated behavior  3.  Review the note authored by the RN or Team Member for additional information  4.  Document specific behaviors observed  5.  Consider SAFE Team call for additional resources              The Broset Violence Checklist (BVC) is a 6 item inventory that was designed to assist in the prediction of imminent violent behavior (24 hours perspective) in healthcare and other sectors where workplace violence is acknowledged as a serious problem.   The BVC does not give instructions on what to do or what interventions to provide when a violent episode occurs; it is a took meant to aid in the risk assessment process. Interventions are based  on the individual, the environment, and cultural sensitivity indicators.

## 2020-10-10 NOTE — Progress Notes (Signed)
Psychiatry Progress Note  (Level 1 = Problem Focused, Level 2 = Expanded Problem Focused, Level 3 = Detailed)    Patient Name: Kristopher Rogers            Current Date/Time:  1/10/20221:14 PM  MRN:  [id number]                            Attending Physician: Janace Aris, MD  DOB: 28-Apr-1961                                Gender: male                              Legal Status   Committed for up to 30 days on 10/03/2020  I. History   Informants: Patient, chart reviewed, staff  A. Chief Complaint or Reason for Admission      Sore throat  B.History of Present Illness: Interval History     (Symptoms and qualifiers:1 for Level 1, 2 for Level 2 and 3+ for Level 3)    Patient is a 60 y.o. Caucasian male presenting with mild sore throat, cough, and congestion with complaints this morning.  Found lying in his bed with a constricted affect, calm and cooperative.  Patient reports being in a good mood and slept well last night.  He denies thoughts of hurting self or others.  He denies hearing voices or seeing things that others cannot hear or see.  He denies medication side effects.  No safety or behavior issues reported from overnight. Over weekend reported that he saw his wife in the hallway and weekend shift notes he was acting "bizarre." He denies chest pain, myalgia, dizziness or shortness of breath. He reports that he typically signs 6 month contracts with motels to stay there and plans to do that when he leaves.     Patient was seen and examined again by attending psychiatrist. He states he feels he is improving, gets confused when asking about his praying behaviors in the bathroom. He pauses, and then states " I just do it that way." He also states the feeling of " people after me," is less, " but that was then," unclear when he was referring to. He denies thoughts of hurting himself or others, states " absolutely not." He then endorses AH , improving, and denies VH. Then all of a sudden he states " wait I have a  meeting tomorrow, isn't tomorrow Monday?" Writer reorients him, and he then gets frustrated. He agreed to continue having his daughter involved in his care and receive updates. He would like to go to a hotel post discharge, but then when asking him how he would take care of himself, he states " yeah." Unable to explain this to the writer, but made a goal to process this and make steps while here.    C.Medical History  Review of Systems  (Level 1 is none, Level 2 is 1, and Level 3 is 2+)  Psychiatric: Psychosis and Improving anxiety  Constitutional: Fatigue  Ears/Nose/Mouth/Throat: Sore throat  Respiratory: cough  Gastrointestinal: No complaints    D.Additional Past Psychiatric, Substance Use, Medical, Family and Social History  Psychiatric: No new information available as compared to previous encounter(s)  Substance Use: No new information available as compared to previous encounter(s)  Medical: No new  information available as compared to previous encounter(s)  Family: No new information available as compared to previous encounter(s)  Social: No new information available as compared to previous encounter(s)    Medications:   Current Medications  Report    Scheduled     Medication Ordered Dose/Rate, Route, Frequency Last Action    amLODIPine (NORVASC) tablet 10 mg 10 mg, PO, Daily Given, 10 mg at 01/10 0843    folic acid (FOLVITE) tablet 1 mg 1 mg, PO, Daily Given, 1 mg at 01/10 0844    multivitamin tablet 1 tablet 1 tablet, PO, Daily Given, 1 tablet at 01/10 0843    OLANZapine (ZyPREXA) tablet 15 mg 15 mg, PO, QHS Given, 15 mg at 01/09 2111    propranolol (INDERAL) tablet 20 mg 20 mg, PO, Q8H SCH Given, 20 mg at 01/10 0610    thiamine (VITAMIN B1) tablet 100 mg 100 mg, PO, Daily Given, 100 mg at 01/10 0844          PRN     Medication Ordered Dose/Rate, Route, Frequency Last Action    acetaminophen (TYLENOL) suppository 650 mg (Or Linked Group #1) 650 mg, RE, Q6H PRN See Alternative, 01/08 2019    acetaminophen  (TYLENOL) tablet 650 mg (Or Linked Group #1) 650 mg, PO, Q6H PRN Given, 650 mg at 01/08 2019    alum & mag hydroxide-simethicone (MAALOX PLUS) 200-200-20 mg/5 mL suspension 15 mL 15 mL, PO, Q4H PRN Given, 15 mL at 01/04 1703    benzocaine-menthol (CEPACOL) lozenge 1 lozenge 1 lozenge, BU, Q1H PRN Ordered    benzonatate (TESSALON) capsule 100 mg 100 mg, PO, TID PRN Ordered    dextromethorphan-guaiFENesin (MUCINEX DM) 30-600 MG per 12 hr tablet 1 tablet 1 tablet, PO, BID PRN Given, 1 tablet at 01/10 1113    guaiFENesin-codeine (ROBITUSSIN AC) 100-10 MG/5ML syrup 5 mL 5 mL, PO, Q6H PRN Ordered    haloperidol (HALDOL) tablet 5 mg 5 mg, PO, Q4H PRN Given, 5 mg at 01/09 0545    hydrALAZINE (APRESOLINE) injection 10 mg 10 mg, IV, Q6H PRN Ordered    lidocaine (LIDODERM) 5 % 1 patch 1 patch, TD, Q24H PRN Ordered    LORazepam (ATIVAN) tablet 1 mg 1 mg, PO, Q4H PRN Given, 1 mg at 01/08 2019    ondansetron (ZOFRAN) tablet 4 mg 4 mg, PO, Q8H PRN Given, 4 mg at 01/04 1703                II. Examination   Vital signs reviewed:   Blood pressure 130/78, pulse 76, temperature 97.9 F (36.6 C), temperature source Oral, resp. rate 17, height 1.803 m (5' 10.98"), weight 86.2 kg (190 lb 0.6 oz), SpO2 96 %.     Mental Status Exam  (Level 1 is 1-5, Level 2 is 6-8, Level 3 is 9+)  General appearance: Appears chronological age, Good hygiene and Good grooming  Attitude/Behavior: Calm, Cooperative and Eye Contact is  Good  Motor: No abnormalities noted  Gait: No obvious abnormalities  Muscle strength and tone: Not tested  Speech:   Spontaneous: Yes  Rate and Rhythm: Normal  Volume: Normal  Tone: Normal  Clarity: Yes  Mood: good  Affect:   Range: Constricted  Stability: Stable  Appropriateness to thought content: Yes  Intensity: Normal  Thought Process:   Coherent: Yes  Logical:  Yes   At times illogical  Associations: Goal-directed  Thought Content:   Delusions:  Not elicited  Depressive Cognitions:  None  Suicidal:  No suicidal  thoughts  Homicidal:  No homicidal thoughts  Violent Thoughts:  No  Perceptions:   Hallucinations: No , later endorsing AH  Insight: Poor  Judgment: Poor  Cognition:   Level of Consciousness: Intact  Orientation: Intact to self, place and time  Recent Memory: Intact   Confused at times to situation      Imaging / EKG / Labs:   Labs in the last 24 hours  Results     Procedure Component Value Units Date/Time    Comprehensive metabolic panel [[id number]]  (Abnormal) Collected: 10/10/20 0701    Specimen: Blood Updated: 10/10/20 0731     Glucose 105 mg/dL      BUN 40.9 mg/dL      Creatinine 0.7 mg/dL      Sodium 811 mEq/L      Potassium 3.8 mEq/L      Chloride 103 mEq/L      CO2 21 mEq/L      Calcium 8.5 mg/dL      Protein, Total 5.7 g/dL      Albumin 2.8 g/dL      AST (SGOT) 17 U/L      ALT 11 U/L      Alkaline Phosphatase 54 U/L      Bilirubin, Total 0.3 mg/dL      Globulin 2.9 g/dL      Albumin/Globulin Ratio 1.0     Anion Gap 12.0    GFR [[id number]] Collected: 10/10/20 0701     Updated: 10/10/20 0731     EGFR >60.0    CBC without differential [[id number]]  (Abnormal) Collected: 10/10/20 0701    Specimen: Blood Updated: 10/10/20 0723     WBC 4.70 x10 3/uL      Hgb 11.8 g/dL      Hematocrit 46.9 %      Platelets 192 x10 3/uL      RBC 3.58 x10 6/uL      MCV 99.7 fL      MCH 33.0 pg      MCHC 33.1 g/dL      RDW 12 %      MPV 9.5 fL      Nucleated RBC 0.0 /100 WBC      Absolute NRBC 0.00 x10 3/uL         EKG Results   Cardiology Results     Procedure Component Value Units Date/Time    ECG 12 lead [[id number]] Collected: 10/03/20 1418     Updated: 10/03/20 1442     Ventricular Rate 91 BPM      Atrial Rate 91 BPM      P-R Interval 158 ms      QRS Duration 68 ms      Q-T Interval 388 ms      QTC Calculation (Bezet) 477 ms      P Axis 55 degrees      R Axis -23 degrees      T Axis 41 degrees     Narrative:      NORMAL SINUS RHYTHM  NORMAL ECG  Confirmed by KOPIN, DAVID ([id number]) on 10/03/2020 2:42:48 PM            III. Assessment and  Plan (Medical Decision Making)     1. I certify that this patient continues to require inpatient hospitalization due to unable to care for self in the community with insufficient support available    2. Psychiatric Diagnoses   Unspecified psychosis (ddx: schizophrenia vs substance (alcohol)  induced vs schizoaffective)   Ruled out Alcohol withdrawal complications presenting also with psychosis   Unspecified anxiety   Alcohol use disorder, severedependence   Rule outneurocognitivedisorder   History of Gout   HTN   Covid-19 Positive    3.All diagnostic procedures completed since admission were reviewed. Past medical records reviewed. Coordination of care was discussed with inpatient team and as available with the outpatient team.    4. On this admission patient educated about and provided input into their treatment plan.  Patient understands potential risks and benefits of proposed treatment plan.     5.  Assessment / Impression  Patient is a59 y.o.male, in a relationship, unstable housing(but reports motel), unclear current employment,with a history of anxiety and alcohol use disorder that was initially admitted under a TDO and then committed on 10/03/2020., in the setting of increased paranoia and SI over the last several weeks.   Patient is a poor historian and appears to have deficits in his memory. He exhibits paranoid and grandiose delusions of being followed and has been observed reacting to internal stimuli since being admitted. Has some history of features of bipolar although he does not presently appear to be in a manic or depressive state. Psychosis seems to be his primary issue. He has no known history of withdrawal sx from alcohol and has been scoring low on CIWA since admission. CIWA completed, alcohol withdrawal ruled out. Head CT w/o contrast did not show major intracranial abnormalities. Subjective complaints of psychosis have resolved but nursing observational reports show ongoing  bizarre behaviors.    Patient sore throat complaints this morning. Reports improving anxiety, denies suicidal or homicidal ideation. He denies paranoia or hallucinations. Denies medication side effects.  Pt was febrile overnight, received tylenol with desired outcomes. Denies SOB, myalgia, nausea, vomiting or diarrhea.  Will maintain on current medications.  Continue to monitor patient for changes and offer support.      He still has moments of paranoia, confusion and bizarre behavior. Improving but still cannot make steps into how he will care for himself, requires further acute inpatient psychiatric hospitalization for stabilization and then most likely disposition to a hotel, along with collaboration with his daughter.      Suicide Risk Assessment   Suicide Risk Assessment performed? Yes: Suicide Thoughts / Behaviors: None  Current Plan: None    Plan / Recommendations:    Biological Plan:  Medications:   Current Facility-Administered Medications   Medication Dose Route Frequency    amLODIPine  10 mg Oral Daily    folic acid  1 mg Oral Daily    multivitamin  1 tablet Oral Daily    OLANZapine  15 mg Oral QHS    propranolol  20 mg Oral Q8H SCH    thiamine  100 mg Oral Daily     PRN MucinexD ordered  Medical Work-up: None required at this time  Consults: Medicine on board for symptomatic covid    Psychosocial Plan:  Individual therapy: Psycho-education and psychotherapy of the following modalities will continue to be provided during daily visits:Supportive and Insight Oriented Psychotherapy  Group and milieu therapies: daily per unit's schedule.  Continue with Social Work intervention provided for discharge planning including assistance with case management referral and follow-up psychiatric care.    Plan for Family Involvement: Update as needed  Other Providers Contact Information and Dates Contacted: No Updates        Signed by: Ina Homes, MD  10/10/2020  1:14 PM  Attending Attestation:         I saw  and examined the patient with resident physician Ina Homes, MD. I directly supervised the resident and I performed the critical portion of the service (history, mental status exam and medical decision-making).   I discussed the patient's assessment and plan in detail with the resident physician. I reviewed the note and agree with the documented findings and plan of care with direct edits made to the note above, prior to signing it.   A total of 35  minutes was spent face to face and on the unit with greater than 50% in counseling and coordinating care as outlined above.     Signed by:  Westly Pam, MD

## 2020-10-10 NOTE — Plan of Care (Signed)
60 year old male with no prior psychiatric history hypertension, essential tremors, admitted to medicine service 12/31 through 1/2 as TDO for paranoid and suicidal ideations, possible alcohol withdrawal, AKI, and transferred to inpatient psychiatry on 1/2 [facility] psychiatric treatment.  Medicine consulted on 1/6 for worsening renal function, with creatinine up to 1.7 from prior 1.1.  Recently initiated on colchicine and Advil for possible gout flare versus hand trauma. Colchicine and NSAID placed on hold and creatinine now improving.  Patient was found to be positive for COVID on 1/7 after being tested after potential exposure.  Febrile on 1/8, likely in setting of COVID.    Afebrile with vital stable on room air.  No new labs    A/P:  AKI, suspect related to NSAID, now resolved  SIRS criteria, suspect secondary to COVID  COVID positive  Tremors  Hypertension  Acute psychosis  Hx gout  R hand trauma    Not hypoxic from COVID standpoint, therefore will defer any COVID-specific treatments.  If he develops hypoxia, will check chest x-ray and consider dexamethasone and remdesivir treatment  BMP with improved creatinine  Patient reports history of gout, usually in the feet, ankles, knees. Uric acid level 8.2   Patient reports recent trauma to right hand with swelling, now improving. XR R hand 1/3 without evidence of acute fx or bony erosions   Medicine will continue to follow.  Please call with questions

## 2020-10-11 MED ORDER — HYDROXYZINE PAMOATE 25 MG PO CAPS
25.0000 mg | ORAL_CAPSULE | Freq: Four times a day (QID) | ORAL | Status: DC | PRN
Start: 2020-10-11 — End: 2020-10-21
  Administered 2020-10-12 – 2020-10-19 (×4): 25 mg via ORAL
  Filled 2020-10-11 (×4): qty 1

## 2020-10-11 NOTE — Plan of Care (Addendum)
Problem: At Risk for Harm to Self: AS EVIDENCED BY...  Goal: Reduction of self-harm thoughts and no attempts  Outcome: Progressing     Problem: Safety  Goal: Patient will be free from injury during hospitalization  Outcome: Progressing   Patient was alert and oriented x2. He denied SI, HI, AVH, and pain. Patient reported his mood as "good." Patient was restless but cooperative upon approach. Patient did not report any issues with sleep or appetite. Patient complained of sore throat. PRN Cepacol was given at 1451. Patient was compliant with isolation precautions. Patient was able to make his needs known. He was complaint with medication. Will continue to monitor patient for safety.

## 2020-10-11 NOTE — Progress Notes (Addendum)
Checklist instructions: Score the patient once a shift and as needed. Absence of a behavior results in a score of 0. Presence or increase in the display of a behavior results in a score of 1. Maximum score (SUM) possible is 6. If a behavior is at baseline for a patient, e.g. the patient is normally confused, this will result in a score of 0. An increase in confusion will result in a score of 1.      Kristopher Rogers is being assessed under the Broset Violence Checklist for any escalations in potentially violent or harmful behavior.     Date of Admission: 10/02/2020                                                                 Date of Assessment: 10/11/2020    Assessment Time 0800      Confused 0      Irritable 0      Boisterous 0      Verbal Threats 0      Physical Threats 0      Attacking Objects 0      SUM 0          TW attempted the following interventions based on the patient's sum total score assessed on the BVC: 1= Monitoring.      Should the patient require multiple interventions or fails to engage in de-escalation with staff, please review the plan for managing the care of an escalated patient.  Please do the following:  1.  Immediately notify Security and Nursing Administrative Supervisor/Director to respond  2.  Conduct team huddle to rule out clinical causes for escalated behavior  3.  Review the note authored by the Associate Professor for additional information  4.  Document specific behaviors observed  5.  Consider SAFE Team call for additional resources              The Broset Violence Checklist (BVC) is a 6 item inventory that was designed to assist in the prediction of imminent violent behavior (24 hours perspective) in healthcare and other sectors where workplace violence is acknowledged as a serious problem.   The BVC does not give instructions on what to do or what interventions to provide when a violent episode occurs; it is a took meant to aid in the risk assessment process. Interventions are  based on the individual, the environment, and cultural sensitivity indicators.

## 2020-10-11 NOTE — UM Notes (Signed)
10/11/2020 4:00 pm Continued stay review for admission date 10/02/2020    Legal Status:TDO hearing on 10/03/2020; committed for up to 30 days.     Tested + for sars-cov-2 on 10/07/2020.     10/11/2020 per NP notes Cecille Rubin, NP:   [facility]: taking scheduled meds; PRN lorazepam 1mg  x2 doses, mucinex DM x1 dose given in the past 24 hours.     Patient was transferred from [facility] to [facility] psych-covid unit due to covid-infection, and new patient to this care team.     Patient reported he was admitted because he was "running away". 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  Pt denied hallucinations, SI, self harm gestures at this time. Denied previous psychotropic medications prior to this admission.     Pt reported, 5 years ago, he and his wife became separated, and their divorce was finalized about 2.5 years ago. Has grown up kids ages 29,28,26. Pt reported he had been living in [place], at a [employer] for the past 4 years.     II. Examination   Vital signs reviewed:   Blood pressure 157/88, pulse 85, temperature 98.1 F (36.7 C), temperature source Oral, resp. rate 17, height 1.803 m (5' 10.98"), weight 86.2 kg (190 lb 0.6 oz), SpO2 97 %.     Mental Status Exam  (Level 1 is 1-5, Level 2 is 6-8, Level 3 is 9+)  General appearance: Appears chronological age and fair hygiene; wearing pt attire gown, resting on bed, fully awake  Attitude/Behavior: Calm, Cooperative and Eye Contact is  Good  Motor: No abnormalities noted  Gait: Not observed  Muscle strength and tone: Not tested  Speech:   Spontaneous: Yes  Rate and Rhythm: Normal  Volume: Normal  Tone: Monotonous  Clarity: Yes  Mood: "okay"  Affect:   Range: Blunt  Thought Process:   Coherent: Yes  Logical:  Yes at times  Associations: Circumstantial  Thought Content:    Delusions:  Yes  Depressive Cognitions:  Helpless  Suicidal:  No suicidal thoughts  Homicidal:  No homicidal thoughts  Violent Thoughts:  No  Perceptions:   Hallucinations: No  Insight: Poor  Judgment: Poor  Cognition:   Level of Consciousness: Intact  Orientation: Intact to self, place and time     Psychiatric Diagnoses     Unspecified psychosis (ddx: schizophrenia vs substance (alcohol) induced vs schizoaffective)   Ruledout Alcohol withdrawal complications presenting also with psychosis   Unspecified anxiety   Alcohol use disorder, severedependence   Rule outneurocognitivedisorder    Medical concerns, per IM note  Tremors  Hypertension  Hx gout  R hand trauma  AKI, suspect related to NSAID, now resolved  SIRS criteria, suspect secondary to COVID  Covid-19 Positive (on 10/07/2020)    1/11: First encounter with patient under this care team. Continues to exhibit paranoid delusions. No hallucinations or SI noted. Over the next few visits, will try to clarify psychiatric diagnosis. Pt had denied previous psychotropic treatment ; however per H&P, pt was provided with sertraline about 4 weeks prior to admission, by his PCP, with led to decompensation. His history of mental health journey is quite unclear.  Pt did not appear in physical distress. Appreciate Internal medicine follow up (initiated on 1/6 for worsening renal function). Per today's IM notes, pt IM  team has deferred any covid-specific treatments due to lack of hypoxia. Pt continues to require inpatient hospitalization for treatment of his psychosis, and until appropriate discharge plan is coordinated .     Plan / Recommendations:  Biological Plan:  Medications:   Current Facility-Administered Medications  Medication Dose Route Frequency   amLODIPine  10 mg Oral Daily   folic acid  1 mg Oral Daily   multivitamin  1 tablet Oral Daily   OLANZapine  15 mg Oral QHS   propranolol  20 mg Oral Q8H SCH   thiamine  100 mg Oral Daily      Medical  Work-up: None required at this time     Consults: Internal Medicine from today's note:   Not hypoxic from COVID standpoint, therefore will defer any COVID-specific treatments. If he develops hypoxia, will check chest x-ray and consider dexamethasone and remdesivir treatment  BMPwith improved creatinine  Patient reports history of gout, usually in the feet, ankles, knees. Uric acid level 8.2  Patient reports recent trauma to right hand with swelling, now improving. XR R hand 1/3 without evidence of acute fx or bony erosions  Medicine will continue to follow    Psychosocial Plan:  Individual therapy: Psycho-education and psychotherapy of the following modalities will continue to be provided during daily visits:Supportive     Group and milieu therapies: daily per unit's schedule.    Continue with Social Work intervention provided for discharge planning including assistance with placement, case management referral and follow-up psychiatric care.    Plan for Family Involvement: sister        10/11/2020 Per chart notes [place] planner Irving Burton:   Worker received a message from Orvil Feil, Lipos Coordinator, at [phone] and she asked for clinical information for the pt. To extend his lipos. Worker faxed the clinicals to [phone]. Worker told Irving Burton to follow up with CM, Morrie Sheldon for the pt. With the lipos authorization.               Review [facility]  TDO: [id number]  LIPOS Per SW note: Orvil Feil, Lipos Coordinator, at [phone] and she asked for clinical information for the pt. To extend his lipos. Worker faxed the clinicals to [phone].   Iantha Fallen, RN,BSN  [facility] Review Psychiatry  [facility]  [phone] Confidential VM  [phone] Fax UR Dept

## 2020-10-11 NOTE — Plan of Care (Signed)
Patient seen in his room for evaluation. Alert, oriented to person and place only, occasionally confused in his conversation. Denies any SI/HI/AVH/SIB. Isolative to self, cooperative and pleasant. Medication compliant, PRNs given was Ativan 1mg  at bedtime for anxiety. Right hand remains mildly swollen, no Ice-pack given. Covid positive isolation remains in place.      Problem: At Risk for Harm to Self: AS EVIDENCED BY...  Goal: Will identify long and short term goals based on individual needs and strengths  Outcome: Progressing  Goal: Reduction of self-harm thoughts and no attempts  Outcome: Progressing  Goal: Verbalizes understanding of medication, benefits, and side effects  Outcome: Progressing     Problem: Safety  Goal: Patient will be free from injury during hospitalization  Outcome: Progressing    Problem: Addiction to alcohol AS EVIDENCED BY:  Goal: Will identify long and short term goals based on individual needs and strengths  Outcome: Progressing  Goal: Patient achieves a safe detoxification and management of withdrawal symptoms  Outcome: Progressing

## 2020-10-11 NOTE — Progress Notes (Signed)
Worker received a message from Orvil Feil, [employer], at [phone] and she asked for clinical information for the pt. To extend his lipos. Worker faxed the clinicals to [phone]. Worker told Irving Burton to follow up with CM, Morrie Sheldon for the pt. With the lipos authorization.

## 2020-10-11 NOTE — Progress Notes (Signed)
Psychiatry Progress Note  (Level 1 = Problem Focused, Level 2 = Expanded Problem Focused, Level 3 = Detailed)    Patient Name: Kristopher Rogers              Current Date/Time:  1/11/202211:00 AM  MRN:  [id number]                              Attending Physician: Janace Aris, MD  DOB: July 28, 1961                                Gender: male                              I. History   Informants: patient, chart-review  A. Chief Complaint or Reason for Admission       CC" I was running away"    B.History of Present Illness: Interval History     (Symptoms and qualifiers:1 for Level 1, 2 for Level 2 and 3+ for Level 3)  Patient is a 60 y.o. male, in a relationship, unstable housing, unclear current employment,  with PMHx of HTN, essential tremors, initially admitted from ED to medical unit on 09/30/2020 for alcohol withdrawal, AKI; on 10/02/2020, pt was admitted to psychiatry for a history of anxiety and alcohol use disorder that was admitted under a TDO in the setting of increased paranoia and SI over the last several weeks. TDO hearing on 10/03/2020; committed for up to 30 days. Tested + for sars-cov-2 on 10/07/2020.     [facility]: taking scheduled meds; PRN lorazepam 1mg  x2 doses, mucinex DM x1 dose given in the past 24 hours.     Patient was transferred from [facility] psych-covid unit due to covid-infection, and new patient to this care team.     Patient reported he was admitted because he was "running away". 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  Pt denied hallucinations, SI, self harm gestures at this time. Denied previous psychotropic medications prior to this admission.     Pt reported, 5 years ago, he and his wife became separated, and their divorce was finalized about 2.5 years ago. Has grown up kids ages 29,28,26. Pt reported he had  been living in [place], at a [employer] for the past 4 years.     C.Medical History  Review of Systems  (Level 1 is none, Level 2 is 1, and Level 3 is 2+)  Psychiatric: Psychosis  Constitutional: No complaints    D.Additional Past Psychiatric, Substance Use, Medical, Family and Social History  Psychiatric: No new information available as compared to previous encounter(s)  Substance Use: No new information available as compared to previous encounter(s)  Medical: No new information available as compared to previous encounter(s)  Family: No new information available as compared to previous encounter(s)  Social: No new information available as compared to previous encounter(s)    Medications:   Current Medications  Report    Scheduled     Medication Ordered Dose/Rate, Route, Frequency Last Action    amLODIPine (NORVASC) tablet 10 mg 10 mg, PO, Daily Given, 10 mg at 01/11 [phone]  folic acid (FOLVITE) tablet 1 mg 1 mg, PO, Daily Given, 1 mg at 01/11 0824    multivitamin tablet 1 tablet 1 tablet, PO, Daily Given, 1 tablet at 01/11 0824    OLANZapine (ZyPREXA) tablet 15 mg 15 mg, PO, QHS Given, 15 mg at 01/10 2100    propranolol (INDERAL) tablet 20 mg 20 mg, PO, Q8H SCH Given, 20 mg at 01/11 0454    thiamine (VITAMIN B1) tablet 100 mg 100 mg, PO, Daily Given, 100 mg at 01/11 0824          PRN     Medication Ordered Dose/Rate, Route, Frequency Last Action    acetaminophen (TYLENOL) suppository 650 mg (Or Linked Group #1) 650 mg, RE, Q6H PRN See Alternative, 01/08 2019    acetaminophen (TYLENOL) tablet 650 mg (Or Linked Group #1) 650 mg, PO, Q6H PRN Given, 650 mg at 01/08 2019    alum & mag hydroxide-simethicone (MAALOX PLUS) 200-200-20 mg/5 mL suspension 15 mL 15 mL, PO, Q4H PRN Given, 15 mL at 01/04 1703    benzocaine-menthol (CEPACOL) lozenge 1 lozenge 1 lozenge, BU, Q1H PRN Ordered    benzonatate (TESSALON) capsule 100 mg 100 mg, PO, TID PRN Ordered    dextromethorphan-guaiFENesin (MUCINEX DM) 30-600  MG per 12 hr tablet 1 tablet 1 tablet, PO, BID PRN Given, 1 tablet at 01/10 1113    guaiFENesin-codeine (ROBITUSSIN AC) 100-10 MG/5ML syrup 5 mL 5 mL, PO, Q6H PRN Ordered    haloperidol (HALDOL) tablet 5 mg 5 mg, PO, Q4H PRN Given, 5 mg at 01/09 0545    hydrALAZINE (APRESOLINE) injection 10 mg 10 mg, IV, Q6H PRN Ordered    lidocaine (LIDODERM) 5 % 1 patch 1 patch, TD, Q24H PRN Ordered    LORazepam (ATIVAN) tablet 1 mg 1 mg, PO, Q4H PRN Given, 1 mg at 01/10 2100    ondansetron (ZOFRAN) tablet 4 mg 4 mg, PO, Q8H PRN Given, 4 mg at 01/04 1703                II. Examination   Vital signs reviewed:   Blood pressure 157/88, pulse 85, temperature 98.1 F (36.7 C), temperature source Oral, resp. rate 17, height 1.803 m (5' 10.98"), weight 86.2 kg (190 lb 0.6 oz), SpO2 97 %.     Mental Status Exam  (Level 1 is 1-5, Level 2 is 6-8, Level 3 is 9+)  General appearance: Appears chronological age and fair hygiene; wearing pt attire gown, resting on bed, fully awake  Attitude/Behavior: Calm, Cooperative and Eye Contact is  Good  Motor: No abnormalities noted  Gait: Not observed  Muscle strength and tone: Not tested  Speech:   Spontaneous: Yes  Rate and Rhythm: Normal  Volume: Normal  Tone: Monotonous  Clarity: Yes  Mood: "okay"  Affect:   Range: Blunt  Thought Process:   Coherent: Yes  Logical:  Yes at times  Associations: Circumstantial  Thought Content:   Delusions:  Yes  Depressive Cognitions:  Helpless  Suicidal:  No suicidal thoughts  Homicidal:  No homicidal thoughts  Violent Thoughts:  No  Perceptions:   Hallucinations: No  Insight: Poor  Judgment: Poor  Cognition:   Level of Consciousness: Intact  Orientation: Intact to self, place and time    Psychiatric / Cognitive Instruments: None    Pertinent Physical Exam: Not relevant to current chief complaint/reason for admission    Imaging / EKG / Labs:   Labs in the last 24 hours  Results     **  No results found for the last 24 hours. **          III. Assessment and Plan  (Medical Decision Making)     1. I certify that this patient continues to require inpatient hospitalization due to unable to care for self in the community with insufficient support available    2. Psychiatric Diagnoses     Unspecified psychosis (ddx: schizophrenia vs substance (alcohol) induced vs schizoaffective)   Ruled out Alcohol withdrawal complications presenting also with psychosis   Unspecified anxiety   Alcohol use disorder, severedependence   Rule outneurocognitivedisorder    Medical concerns, per IM note  Tremors  Hypertension  Hx gout  R hand trauma  AKI, suspect related to NSAID, now resolved  SIRS criteria, suspect secondary to COVID  Covid-19 Positive (on 10/07/2020)          3.All diagnostic procedures completed since admission were reviewed. Past medical records reviewed. Coordination of care was discussed with inpatient team and as available with the outpatient team.    4. On this admission patient educated about and provided input into their treatment plan.  Patient understands potential risks and benefits of proposed treatment plan.     5.  Assessment / Impression  Patient is a 60 y.o. male, in a relationship, residing in a hotel for the past 4 years, unclear current employment,  with PMHx of HTN, essential tremors, initially admitted from ED to medical unit on 09/30/2020 for alcohol withdrawal, AKI; on 10/02/2020, pt was admitted to psychiatry for a history of anxiety and alcohol use disorder that was admitted under a TDO in the setting of increased paranoia and SI over the last several weeks. TDO hearing on 10/03/2020; committed for up to 30 days. Tested + for sars-cov-2 on 10/07/2020.     Suicide Risk Assessment  - denied SI    1/10, by previous team: Patient is a poor historian and appears to have deficits in his memory. He exhibits paranoid and grandiose delusions of being followed and has been observed reacting to internal stimuli since being admitted. Has some history of features of  bipolar although he does not presently appear to be in a manic or depressive state. Psychosis seems to be his primary issue. He has no known history of withdrawal sx from alcohol and has been scoring low on CIWA since admission. CIWA completed, alcohol withdrawal ruled out. Head CT w/o contrast did not show major intracranial abnormalities.Subjective complaints of psychosis have resolved but nursing observational reports show ongoing bizarre behaviors.He still has moments of paranoia, confusion and bizarre behavior. Improving but still cannot make steps into how he will care for himself, requires further acute inpatient psychiatric hospitalization for stabilization and then most likely disposition to a hotel, along with collaboration with his daughter.        1/11: First encounter with patient under this care team. Continues to exhibit paranoid delusions. No hallucinations or SI noted. Over the next few visits, will try to clarify psychiatric diagnosis. Pt had denied previous psychotropic treatment ; however per H&P, pt was provided with sertraline about 4 weeks prior to admission, by his PCP, with led to decompensation. His history of mental health journey is quite unclear.  Pt did not appear in physical distress. Appreciate Internal medicine follow up (initiated on 1/6 for worsening renal function). Per today's IM notes, pt IM team has deferred any covid-specific treatments due to lack of hypoxia. Pt continues to require inpatient hospitalization for treatment of his psychosis, and  until appropriate discharge plan is coordinated .       Plan / Recommendations:  Biological Plan:  Medications:   Current Facility-Administered Medications   Medication Dose Route Frequency   . amLODIPine  10 mg Oral Daily   . folic acid  1 mg Oral Daily   . multivitamin  1 tablet Oral Daily   . OLANZapine  15 mg Oral QHS   . propranolol  20 mg Oral Q8H SCH   . thiamine  100 mg Oral Daily       Medical Work-up: None required at this  time     Consults: Internal Medicine from today's note:   -Not hypoxic from COVID standpoint, therefore will defer any COVID-specific treatments.  If he develops hypoxia, will check chest x-ray and consider dexamethasone and remdesivir treatment  -BMPwith improved creatinine  -Patient reports history of gout, usually in the feet, ankles, knees. Uric acid level 8.2  -Patient reports recent trauma to right hand with swelling, now improving. XR R hand 1/3 without evidence of acute fx or bony erosions  -Medicine will continue to follow        Psychosocial Plan:  Individual therapy: Psycho-education and psychotherapy of the following modalities will continue to be provided during daily visits:Supportive     Group and milieu therapies: daily per unit's schedule.    Continue with Social Work intervention provided for discharge planning including assistance with placement, case management referral and follow-up psychiatric care.    Plan for Family Involvement: sister      Other Providers Contact Information and Dates Contacted: No Updates    Total provider time spent 35 minutes (floor time) with more than 50 percent of time in direct patient contact, coordinating care and counseling.    Signed by: Cecille Rubin, NP  10/11/2020  2:09 PM

## 2020-10-11 NOTE — Progress Notes (Signed)
RT provided 1:1 session due to pt being being in isolation due to COVID. Initially, when RT offered to provide pt with 1:1 session, pt asked if RT could come back at 2pm because he had a "conference call". RT returned at 2pm and offered pt opportunity to engage in light stretches/exerices and/or listen to music however, pt declined stating that he would rather just engage in conversation. Pt shared that he is doing well and feeling better than he did, although he still is experiencing occasional sore throat. Pt was able to verbalize his needs/wants to RT without difficutly. He requested extra face masks, socks, and TV in room to be fixed if possible. RT was able to provide pt with both items and was able to help pt work tv/navigate remote. Pt displays poor insight to current condition. Pt expressed frustration due to still being in the hospital. Pt did not seem to understand why he is on the psych unit and sarcastically stated "I'm not hearing or seeing people.. No one is talking to through the TV". Pt expressed delusional thoughts as evidence by stating "I have another meeting tomorrow". RT asked pt what the meeting and conference call was for to which he replied "work". Pt was unable to give appropriate responses when asked how he is engaging in these meetings. Pt was provided with several activities (diversional and leisure education) which can be done independently in his room. RT provided explanation of all of the activities. Pt was receptive and thanked RT for coming. RT met with pt for a total of 30 min.
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
## 2020-10-12 MED ORDER — COLCHICINE 0.6 MG PO TABS
1.2000 mg | ORAL_TABLET | Freq: Once | ORAL | Status: AC
Start: 2020-10-12 — End: 2020-10-12
  Administered 2020-10-12: 21:00:00 1.2 mg via ORAL
  Filled 2020-10-12 (×2): qty 2

## 2020-10-12 MED ORDER — COLCHICINE 0.6 MG PO TABS
0.6000 mg | ORAL_TABLET | Freq: Every day | ORAL | Status: AC
Start: 2020-10-13 — End: 2020-10-18
  Administered 2020-10-13 – 2020-10-18 (×6): 0.6 mg via ORAL
  Filled 2020-10-12 (×6): qty 1

## 2020-10-12 MED ORDER — PREDNISONE 20 MG PO TABS
40.0000 mg | ORAL_TABLET | Freq: Every morning | ORAL | Status: DC
Start: 2020-10-12 — End: 2020-10-17
  Administered 2020-10-12 – 2020-10-17 (×6): 40 mg via ORAL
  Filled 2020-10-12 (×8): qty 2
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
## 2020-10-12 NOTE — Progress Notes (Signed)
Psychiatry Progress Note  (Level 1 = Problem Focused, Level 2 = Expanded Problem Focused, Level 3 = Detailed)    Patient Name: Kristopher Rogers              Current Date/Time:  1/12/20222:47 PM  MRN:  [id number]                              Attending Physician: Claudean Severance, MD  DOB: [birth year]                                Gender: male                              I. History   Informants: patient, patient chart, clinical staff    A. Chief Complaint or Reason for Admission    Suicidal ideation, paranoia, bizarre behavior, inability to maintain ADLs; alcohol withdrawal    B.History of Present Illness: Interval History     (Symptoms and qualifiers:1 for Level 1, 2 for Level 2 and 3+ for Level 3)   60 y.o. male, in a relationship, unstable housing, unclear current employment,  with PMHx of HTN, gout, essential tremors, initially admitted from ED to medical unit on 09/30/2020 for alcohol withdrawal, AKI; on 10/02/2020, pt was admitted to psychiatry for a history of anxiety and alcohol use disorder that was admitted under a TDO in the setting of increased paranoia and SI over the last several weeks. TDO hearing on 10/03/2020; committed for up to 30 days. Tested + for sars-cov-2 on 10/07/2020.     MAR: taking scheduled meds but refused folate this am; PRN hydroxyzine 25 mg x 1      Found in his darkened room today, shades drawn, lights off, sitting up. Complains that he feels like he is "in prison." Also complains of pain in his foot and hand which she states is worse than previous days. He denies thoughts of self-harm, suicide or aggression. He also denies experiencing auditory or visual hallucinations. He denies paranoia and he also denies ideas of reference. He reports he is eating well and sleeping well. Reports that he still plans to return to hotel or "maybe [place]" once he is released from the hospital.    C.Medical History  Review of Systems  (Level 1 is none, Level 2 is 1, and Level 3 is 2+)  Psychiatric:  As above. Does not appear depressed, sad, or anxious. Denies SI/HI. Denies intrusive thoughts. Eating well. Marginal ADLs completed with encouragement only. Sleeping well.  Constitutional: No complaints  Respiratory: No complaints  Musculoskeletal: Complains of right hand pain at his indext MCP and right foot pain in his great toe.    D.Additional Past Psychiatric, Substance Use, Medical, Family and Social History  Psychiatric: No new information available as compared to previous encounter(s)  Substance Use: No new information available as compared to previous encounter(s)  Medical: No new information available as compared to previous encounter(s)  Family: No new information available as compared to previous encounter(s)  Social: No new information available as compared to previous encounter(s)    Medications:   Current Medications  Report    Scheduled     Medication Ordered Dose/Rate, Route, Frequency Last Action    amLODIPine (NORVASC) tablet 10 mg 10 mg, PO, Daily Given, 10 mg at  01/11 0824    folic acid (FOLVITE) tablet 1 mg 1 mg, PO, Daily Given, 1 mg at 01/11 0824    multivitamin tablet 1 tablet 1 tablet, PO, Daily Given, 1 tablet at 01/12 0828    OLANZapine (ZyPREXA) tablet 15 mg 15 mg, PO, QHS Given, 15 mg at 01/11 2157    propranolol (INDERAL) tablet 20 mg 20 mg, PO, Q8H SCH Given, 20 mg at 01/12 0648    thiamine (VITAMIN B1) tablet 100 mg 100 mg, PO, Daily Given, 100 mg at 01/12 0828          PRN     Medication Ordered Dose/Rate, Route, Frequency Last Action    acetaminophen (TYLENOL) suppository 650 mg (Or Linked Group #1) 650 mg, RE, Q6H PRN See Alternative, 01/08 2019    acetaminophen (TYLENOL) tablet 650 mg (Or Linked Group #1) 650 mg, PO, Q6H PRN Given, 650 mg at 01/08 2019    alum & mag hydroxide-simethicone (MAALOX PLUS) 200-200-20 mg/5 mL suspension 15 mL 15 mL, PO, Q4H PRN Given, 15 mL at 01/04 1703    benzocaine-menthol (CEPACOL) lozenge 1 lozenge 1 lozenge, BU, Q1H PRN Given, 1 lozenge at 01/11  2201    benzonatate (TESSALON) capsule 100 mg 100 mg, PO, TID PRN Ordered    dextromethorphan-guaiFENesin (MUCINEX DM) 30-600 MG per 12 hr tablet 1 tablet 1 tablet, PO, BID PRN Given, 1 tablet at 01/10 1113    guaiFENesin-codeine (ROBITUSSIN AC) 100-10 MG/5ML syrup 5 mL 5 mL, PO, Q6H PRN Given, 5 mL at 01/11 2201    haloperidol (HALDOL) tablet 5 mg 5 mg, PO, Q4H PRN Given, 5 mg at 01/09 0545    hydrALAZINE (APRESOLINE) injection 10 mg 10 mg, IV, Q6H PRN Ordered    hydrOXYzine (VISTARIL) capsule 25 mg 25 mg, PO, Q6H PRN Given, 25 mg at 01/12 0236    lidocaine (LIDODERM) 5 % 1 patch 1 patch, TD, Q24H PRN Ordered    ondansetron (ZOFRAN) tablet 4 mg 4 mg, PO, Q8H PRN Given, 4 mg at 01/04 1703                II. Examination   Vital signs reviewed:   Blood pressure 99/62, pulse (!) 101, temperature 98.4 F (36.9 C), temperature source Oral, resp. rate 18, height 1.803 m (5' 10.98"), weight 86.2 kg (190 lb 0.6 oz), SpO2 96 %.     Mental Status Exam  (Level 1 is 1-5, Level 2 is 6-8, Level 3 is 9+)  General appearance: Does not appear chronological age, Poor hygiene, Poor grooming and Disheveled.  Wearing hospital attire, sitting on the edge of his bed, appears in mild pain.  Attitude/Behavior: Agitated, Cooperative, Eye Contact is  Good and Slightly irritable demeanor.  Motor: Essential tremor bilaterally  Gait: Abnormal: Will not walk secondary to his painful toe. and Not observed  Muscle strength and tone: Grossly intact  Speech:   Spontaneous: Yes  Rate and Rhythm: Normal  Volume: Normal  Tone: Monotonous  Clarity: Yes  Mood: Euthymic  Affect:   Range: Blunt  Thought Process:   Coherent: Yes  Logical:  Yes at times  Associations: Circumstantial  Thought Content:   Delusions:  Yes  Depressive Cognitions:  Helpless  Suicidal:  No suicidal thoughts  Homicidal:  No homicidal thoughts  Violent Thoughts:  No  Perceptions:   Hallucinations: No  Insight: Poor  Judgment: Poor  Cognition:   Level of Consciousness:  Intact  Orientation: Intact to self, place and time  Psychiatric / Cognitive Instruments: None    Pertinent Physical Exam: Not relevant to current chief complaint/reason for admission    Imaging / EKG / Labs:   Labs in the last 24 hours  Results     ** No results found for the last 24 hours. **          III. Assessment and Plan (Medical Decision Making)     1. I certify that this patient continues to require inpatient hospitalization due to unable to care for self in the community with insufficient support available    2. 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 Past medical records reviewed. Coordination of care was discussed with inpatient team and as available with the outpatient team.    4. On this admission patient educated about and provided input into their treatment plan.  Patient understands potential risks and benefits of proposed treatment plan.     5.  Assessment / Impression  Patient is a 60 y.o. male, in a relationship, residing in a hotel for the past 4 years, unclear current employment,  with PMHx of HTN, essential tremors, initially admitted from ED to medical unit on 09/30/2020 for alcohol withdrawal, AKI; on 10/02/2020, pt was admitted to psychiatry for a history of anxiety and alcohol use disorder that was admitted under a TDO in the setting of increased paranoia and SI over the last several weeks. TDO hearing on 10/03/2020; committed for up to 30 days. Tested + for sars-cov-2 on 10/07/2020.     Suicide Risk Assessment  Denies thoughts of self-harm,  suicide, or aggression.  Increased prognostic risk factors include history of severe alcoholism with primary thought disorder (schizophrenia), poor social support, financial and domicile insecurity    1/10, by previous team: Patient is a poor historian and appears to have deficits in his memory. He exhibits paranoid and grandiose delusions of being followed and has been observed reacting to internal stimuli since being admitted. Has some history of features of bipolar although he does not presently appear to be in a manic or depressive state. Psychosis seems to be his primary issue. He has no known history of withdrawal sx from alcohol and has been scoring low on CIWA since admission. CIWA completed, alcohol withdrawal ruled out. Head CT w/o contrast did not show major intracranial abnormalities.Subjective complaints of psychosis have resolved but nursing observational reports show ongoing bizarre behaviors.He still has moments of paranoia, confusion and bizarre behavior. Improving but still cannot make steps into how he will care for himself, requires further acute inpatient psychiatric hospitalization for stabilization and then most likely disposition to a hotel, along with collaboration with his daughter.    10/12/2020: Clinical presentation is appearing most consistent with schizophrenia, chronic, with severe alcohol use disorder.  His bizarre delusions seem persistent and minimally responsive to SGA to date.  He does not appear to be an alcohol withdrawal but medically is debilitated because of his gouty flare.  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  Plan to  continue his current olanzapine at 50 mg at at bedtime.  Consulted with medicine for prednisone for his gouty symptoms given his AKI.    Likely disposition will be returned to hotel where he was previously living and follow-up with Adult Protective  Services.  Anticipate length of stay 5 to 7 days given the severity of his impaired judgment and inability to maintain his ADLs secondary to his underlying psychiatric and medical conditions.      Plan / Recommendations:  Biological Plan:  Medications:   Current Facility-Administered Medications   Medication Dose Route Frequency    amLODIPine  10 mg Oral Daily    folic acid  1 mg Oral Daily    multivitamin  1 tablet Oral Daily    OLANZapine  15 mg Oral QHS    propranolol  20 mg Oral Q8H SCH    thiamine  100 mg Oral Daily       Medical Work-up: None required at this time     Consults: Internal Medicine following gout and SARS-CoV-2. Fever spike on 10/08/2020.    Psychosocial Plan:  Individual therapy: Psycho-education and psychotherapy of the following modalities will continue to be provided during daily visits:Supportive     Group and milieu therapies: daily per unit's schedule.    Continue with Social Work intervention provided for discharge planning including assistance with placement, case management referral and follow-up psychiatric care.    Plan for Family Involvement: sister      Other Providers Contact Information and Dates Contacted: No Updates    Total provider time spent 35 minutes (floor time) with more than 50 percent of time in direct patient contact, coordinating care and counseling.    Signed by: Claudean Severance, MD  10/12/2020  2:47 PM

## 2020-10-12 NOTE — Plan of Care (Addendum)
60 year old male with no prior psychiatric history hypertension, essential tremors, admitted to medicine service 12/31 through 1/2 as TDO for paranoid and suicidal ideations, possible alcohol withdrawal, AKI, and transferred to inpatient psychiatry on 1/2 [facility] psychiatric treatment.  Medicine consulted on 1/6 for worsening renal function, with creatinine up to 1.7 from prior 1.1.  Recently initiated on colchicine and Advil for possible gout flare versus hand trauma. Colchicine and NSAID placed on hold and creatinine now improving.  Patient was found to be positive for COVID on 1/7 after being tested after potential exposure.  Febrile on 1/8, likely in setting of COVID.    Afebrile with vital stable on room air.  No new labs    A/P:  AKI, suspect related to NSAID, now resolved  SIRS criteria, suspect secondary to COVID  COVID positive  Tremors  Hypertension  Acute psychosis  Hx gout  R hand trauma    Not hypoxic from COVID standpoint, therefore will defer any COVID-specific treatments.  If he develops hypoxia, will check chest x-ray and consider dexamethasone and remdesivir treatment  BMP with improved creatinine  Patient reports history of gout, usually in the feet, ankles, knees. Uric acid level 8.2   Patient reports recent trauma to right hand with swelling, now improving. XR R hand 1/3 without evidence of acute fx or bony erosions   Medicine will continue to follow.  Please call with questions      ADDENDUM:  Discussed with psychiatry and concern for worsening inflammation related to gout of R toe and hand. Patient did not tolerate NSAID previously with AKI. Will trial course of prednisone 40 mg daily x 7 days and addition of colchicine. Psychiatric issues may be worsened with steroid use.

## 2020-10-12 NOTE — Progress Notes (Signed)
Checklist instructions: Score the patient once a shift and as needed. Absence of a behavior results in a score of 0. Presence or increase in the display of a behavior results in a score of 1. Maximum score (SUM) possible is 6. If a behavior is at baseline for a patient, e.g. the patient is normally confused, this will result in a score of 0. An increase in confusion will result in a score of 1.      Kristopher Rogers is being assessed under the Broset Violence Checklist for any escalations in potentially violent or harmful behavior.     Date of Admission:    10/02/20                                                                     Date of Assessment: 10/12/20   Assessment Time 2000      Confused 0      Irritable 0      Boisterous 0      Verbal Threats 0      Physical Threats 0      Attacking Objects 0      SUM 0          TW attempted the following interventions based on the patient's sum total score assessed on the BVC: 1=Monitoring      Should the patient require multiple interventions or fails to engage in de-escalation with staff, please review the plan for managing the care of an escalated patient.  Please do the following:  1.  Immediately notify Security and Nursing Administrative Supervisor/Director to respond  2.  Conduct team huddle to rule out clinical causes for escalated behavior  3.  Review the note authored by the Associate Professor for additional information  4.  Document specific behaviors observed  5.  Consider SAFE Team call for additional resources              The Broset Violence Checklist (BVC) is a 6 item inventory that was designed to assist in the prediction of imminent violent behavior (24 hours perspective) in healthcare and other sectors where workplace violence is acknowledged as a serious problem.   The BVC does not give instructions on what to do or what interventions to provide when a violent episode occurs; it is a took meant to aid in the risk assessment process. Interventions are  based on the individual, the environment, and cultural sensitivity indicators.

## 2020-10-12 NOTE — Plan of Care (Signed)
Problem: At Risk for Harm to Self: AS EVIDENCED BY...  Goal: Will identify long and short term goals based on individual needs and strengths  Outcome: Progressing  Goal: Reduction of self-harm thoughts and no attempts  Outcome: Progressing  Goal: Verbalizes understanding of medication, benefits, and side effects  Outcome: Progressing     Patient is alert and oriented to person and place, but disoriented to time and situation. He denies SI/HI/AVH and pain. Patient appears confused and delusional on approach and stated, "There is a medical emergency in my family". Patient was observed coughing intermittently and reported sore throat, which he said he has had for long time. He denied shortness of breath. Temperature was 98.2. Compliant with scheduled medications. Patient utilized Robitussin 5 ml PRN for cough and Cepacol PRN at sore throat at 2301. Patient has been cooperative and compliant with isolation precautions thus far. Will continue to provide support and monitor patient for safety.    Patient was observed resting in bed most of the night and slept for approximately 7 hours. No signs of acute distress are present.

## 2020-10-12 NOTE — Plan of Care (Signed)
Problem: Addiction to alcohol or opioids or other substances AS EVIDENCED BY:  Goal: Patient achieves a safe detoxification and management of withdrawal symptoms  Outcome: Progressing  Goal: Patient educated about the disease of addiction and recovery and identifies long-term goal  Outcome: Progressing     Problem: At Risk for Harm to Self: AS EVIDENCED BY...  Goal: Reduction of self-harm thoughts and no attempts  Outcome: Progressing  Goal: Verbalizes understanding of medication, benefits, and side effects  Outcome: Progressing    Kristopher Rogers was observed asleep at the start of shift till about 2130. He  was cooperative on appraoch, but pt was restless and was not compliant with isolation precautions he kept coming out of his room and needed to be reoreinted many times. He was A/O*2, disoriented to time and situation. He denied SI/HI/AVH and pain. Patient C/O sore throat and cough and received PRN Cepacol and Robitussin 5ml at 2201.  Patient was able to make his needs known. Will continue to monitor for safety.    Kristopher Rogers was observed resting with eyes closed appearing to be sleeping for about 5 hours. Pt is stable at this time with no apparent distress noted. Will continue to monitor for changes and safety.

## 2020-10-12 NOTE — Progress Notes (Signed)
Checklist instructions: Score the patient once a shift and as needed. Absence of a behavior results in a score of 0. Presence or increase in the display of a behavior results in a score of 1. Maximum score (SUM) possible is 6. If a behavior is at baseline for a patient, e.g. the patient is normally confused, this will result in a score of 0. An increase in confusion will result in a score of 1.      Kristopher Rogers is being assessed under the Broset Violence Checklist for any escalations in potentially violent or harmful behavior.     Date of Admission: 10/02/20                                         Date of Assessment: 10/11/20   Assessment Time 2100      Confused 0      Irritable 0      Boisterous 0      Verbal Threats 0      Physical Threats 0      Attacking Objects 0      SUM 0          TW attempted the following interventions based on the patient's sum total score assessed on the BVC: 1= Monitoring.      Should the patient require multiple interventions or fails to engage in de-escalation with staff, please review the plan for managing the care of an escalated patient.  Please do the following:  1.  Immediately notify Security and Nursing Administrative Supervisor/Director to respond  2.  Conduct team huddle to rule out clinical causes for escalated behavior  3.  Review the note authored by the Associate Professor for additional information  4.  Document specific behaviors observed  5.  Consider SAFE Team call for additional resources              The Broset Violence Checklist (BVC) is a 6 item inventory that was designed to assist in the prediction of imminent violent behavior (24 hours perspective) in healthcare and other sectors where workplace violence is acknowledged as a serious problem.   The BVC does not give instructions on what to do or what interventions to provide when a violent episode occurs; it is a took meant to aid in the risk assessment process. Interventions are based on the individual, the  environment, and cultural sensitivity indicators.

## 2020-10-12 NOTE — Progress Notes (Signed)
Checklist instructions: Score the patient once a shift and as needed. Absence of a behavior results in a score of 0. Presence or increase in the display of a behavior results in a score of 1. Maximum score (SUM) possible is 6. If a behavior is at baseline for a patient, e.g. the patient is normally confused, this will result in a score of 0. An increase in confusion will result in a score of 1.      Kristopher Rogers is being assessed under the Broset Violence Checklist for any escalations in potentially violent or harmful behavior.     Date of Admission:    10/02/20                                                                     Date of Assessment: 10/12/20   Assessment Time 0800      Confused 1      Irritable 0      Boisterous 0      Verbal Threats 0      Physical Threats 0      Attacking Objects 0      SUM 1          TW attempted the following interventions based on the patient's sum total score assessed on the BVC: 1= Monitoring.    Should the patient require multiple interventions or fails to engage in de-escalation with staff, please review the plan for managing the care of an escalated patient.  Please do the following:  1.  Immediately notify Security and Nursing Administrative Supervisor/Director to respond  2.  Conduct team huddle to rule out clinical causes for escalated behavior  3.  Review the note authored by the Associate Professor for additional information  4.  Document specific behaviors observed  5.  Consider SAFE Team call for additional resources              The Broset Violence Checklist (BVC) is a 6 item inventory that was designed to assist in the prediction of imminent violent behavior (24 hours perspective) in healthcare and other sectors where workplace violence is acknowledged as a serious problem.   The BVC does not give instructions on what to do or what interventions to provide when a violent episode occurs; it is a took meant to aid in the risk assessment process. Interventions are  based on the individual, the environment, and cultural sensitivity indicators.

## 2020-10-12 NOTE — Plan of Care (Signed)
Problem: Safety  Goal: Patient will be free from injury during hospitalization  Outcome: Progressing   SW spoke with this pt over the intercom in his room. He was open to speaking to SW. Pt gave verbal permission for this SW to communicate with his daughter as needed. He is unsure of where he will go at the time of [place]. Pt is not stable to leave at this time. Pt is unsure if it is an option for him to stay with his daughter but it is something he is willing to explore. Pt confirmed that prior to this he was living in a hotel and had no community supports. Pt has not psychiatrist, therapist, or CM. Pt is uninsured. Pt is receiving LIPOS and will need [facility] appointment set up prior to [place]. LIPOS approved through 10/14/20.

## 2020-10-12 NOTE — Progress Notes (Signed)
Daily Note:    Affect/Mood:  Constricted  Thought Process:  Confused  Thought Content:  OtherTangential  Interpersonal:  Guarded    Treatment Plan Goal:  Remains safe during hospitalization. Attends minimum number of therapies daily.    Therapeutic Interventions:  No groups provided on the unit due to COVID isolation. The therapist met with the patient 1:1 via phone to offer support. Therapist asked if pt had received the food that he expected the night before at 10pm, for which he had forgone the hospital provided dinner, and pt reported that he did not receive the food from his friends. Therapist asked if pt ate the hospital food today, and pt reported that he did. Pt reported that he had not talked with anyone over the phone today, but reported that his fiance knows that he is in the hospital. Pt requested water with ice. Therapist provided a cup with ice water and asked the pt to not spill the water on the floor. Pt paused and then nodded in agreement. Therapist will continue to reinforce pro-social behaviors, appropriate expression of feelings, and use of/efforts to use coping skills.       Kizzie Fantasia, MEd, LPC-R, NCC, Resident in Counseling

## 2020-10-13 NOTE — Progress Notes (Signed)
MEDICINE CONSULT FOLLOW-UP    Date Time: 10/13/20 11:58 AM  Patient Name: Kristopher Rogers  Attending Physician: Claudean Severance, MD  Consulting Physician: Ulice Bold, MD, MD      Assessment:     Patient Active Problem List    Diagnosis Date Noted    AKI (acute kidney injury) 10/07/2020    Suicidal behavior with attempted self-injury 10/03/2020    Bipolar I disorder, most recent episode manic, severe with psychotic features 10/03/2020    Alcohol withdrawal syndrome with complication 09/30/2020     60 year old male with no prior psychiatric history hypertension, essential tremors, admitted to medicine service 12/31 through 1/2 as TDO for paranoid and suicidal ideations, possible alcohol withdrawal, AKI, and transferred to inpatient psychiatry on 1/2. Medicine consulted on 1/6 for worsening renal function, with creatinine up to 1.7 from prior 1.1. Recently initiated on colchicine and Advil for possible gout flare versus hand trauma. Colchicine and NSAID placed on hold and creatinine improved.  Patient was found to be positive for COVID on 1/7 after being tested after potential exposure.  Febrile on 1/8, likely in setting of COVID. Concern for acute gout flare of R second MCP joint and initiated on prednisone, colchicine on 1/12.     Recommendations:   AKI, suspect related to NSAID, now resolved  SIRS criteria, suspect secondary to COVID, resolved  COVID positive, mildly symptomatic  Tremors  Hypertension  Acute psychosis  Hx gout with possible acute flare of 2nd R MCP joint  R hand trauma  Initiated on prednisone, colchicine on 1/12 for possible acute gout flare.  Patient reports symptomatic improvement since yesterday.  Plan for empiric 7-day course  Not hypoxic from COVID standpoint, therefore will defer any COVID-specific   treatments.  Reports sore throat and cough that are now improving.  Patient reports recent trauma to right hand with swelling, however, he is a poor historian. XR R hand 1/3  without evidence of acute fx or bony erosions  BMPwith improved creatinine  Patient reports history of gout, usually in the feet, ankles, knees. Uric acid level 8.2. treatment for acute gout flare as above  Medicine will continue to follow.  Please call with questions          CC: follow-up: medical comanagement    HPI/Subjective:Afebrile with vital stable on room air.    No new labs.  Patient reports right hand swelling and pain is improved from yesterday.  Does report history of trauma and falling on the right hand.  He is confused about when this may have happened.  Reports his left toe and ankle have had gout in the past but does not feel he has an acute flare at this time.  Reports some sore throat that is improving.  No shortness of breath.    Review of Systems:   Review of Systems - Negative except as noted in HPI        Physical Exam:     Patient Vitals for the past 24 hrs:   BP Temp Temp src Pulse Resp SpO2   10/13/20 0853 138/79        10/13/20 0828 138/79 97.3 F (36.3 C) Oral (!) 58  98 %   10/13/20 0549 124/77 97.4 F (36.3 C) Oral 78  97 %   10/12/20 2133 110/77   (!) 118     10/12/20 1949 118/64 98.2 F (36.8 C) Oral 100 14 96 %   10/12/20 1500 118/82 97.7 F (36.5 C) Oral 92  98 %     Body mass index is 26.52 kg/m.  No intake or output data in the 24 hours ending 10/13/20 1158    General: awake, alert,  no acute distress.  Lungs: Respirations are unlabored at rest  Extremities: Right hand swelling with swelling and mild erythema at second MCP joint.  He has chronic asymmetric edema of his left ankle.  Some pain with movement at the left ankle.  Left first MTP joint without swelling or erythema.    Meds:     Medications were reviewed:    Labs:     No results for input(s): WBC, HGB, HCT, PLT in the last 72 hours.    No results for input(s): NA, K, CL, CO2, BUN, CREAT, GLU, CA, MG, PHOS in the last 72 hours.    No results for input(s): AST, ALT, ALKPHOS, PROT, ALB in the last 72  hours.    No results for input(s): PTT, PT, INR in the last 72 hours.    Imaging personally reviewed, including: no new imaging          Case discussed with: patient        Signed by: Ulice Bold, MD, MD

## 2020-10-13 NOTE — Plan of Care (Signed)
Problem: At Risk for Harm to Self: AS EVIDENCED BY...  Goal: Reduction of self-harm thoughts and no attempts  Outcome: Progressing  Goal: Verbalizes understanding of medication, benefits, and side effects  Outcome: Progressing   Kristopher Rogers denies all  Psych Sx including SI/HI, hallucinations, depression or anxiety. Still delusional, remains somewhat disorganized and confused at times but pleasant and redirectable.  Calm, cooperative, compliant with medications. Compliant with isolation precautions due to COVID + status; no Sx reported. Will continue to monitor and assist.

## 2020-10-13 NOTE — Plan of Care (Signed)
Problem: At Risk for Harm to Self: AS EVIDENCED BY...  Goal: Will identify long and short term goals based on individual needs and strengths  Outcome: Progressing  Goal: Reduction of self-harm thoughts and no attempts  Outcome: Progressing  Goal: Verbalizes understanding of medication, benefits, and side effects  Outcome: Progressing     Patient was delusional, paranoid, and disorganized on approach. He was observed sitting on the floor and said he could not get up due to right knee pain. Patient was assisted to his bed. He initially declined to take his scheduled medications and Tylenol for pain. Patient stated, "I don't nee medications every day. It will damage my kidney. I want to get better in my own way". Patient was encouraged to discuss his concerns regarding his medications with the attending MD tomorrow. He later accepted his medications. Patient is alert and oriented to person and place with intermittent confusion, redirected as needed. Denies SI/HI/AVH. Patient was observed pacing in his room, talking to himself and kept opening the door and talking about secret agencies. He utilized Haldol 5 mg PRN for mood instability and Vistaril 25 mg PRN at 0043 for anxiety. Made his needs known. Will continue to provide support and monitor patient for safety.    Patient was awake throughout the night. No signs of acute distress noted.

## 2020-10-13 NOTE — Progress Notes (Addendum)
Psychiatry Progress Note  (Level 1 = Problem Focused, Level 2 = Expanded Problem Focused, Level 3 = Detailed)    Patient Name: Kristopher Rogers              Current Date/Time:  1/13/20222:18 PM  MRN:  [id number]                              Attending Physician: Claudean Severance, MD  DOB: 12-12-1960                                Gender: male                              I. History   Informants: patient, patient chart, clinical staff    Appreciate internal medicine hospitalist follow-up.    A. Chief Complaint or Reason for Admission    Suicidal ideation, paranoia, bizarre behavior, inability to maintain ADLs; alcohol withdrawal    60 y.o. male, in a relationship, unstable housing, unclear current employment,  with PMHx of HTN, gout, essential tremors, initially admitted from ED to medical unit on 09/30/2020 for alcohol withdrawal, AKI; on 10/02/2020, pt was admitted to psychiatry for a history of anxiety and alcohol use disorder that was admitted under a TDO in the setting of increased paranoia and SI over the last several weeks. TDO hearing on 10/03/2020; committed for up to 30 days. Tested + for sars-cov-2 on 10/07/2020.     B.History of Present Illness: Interval History     (Symptoms and qualifiers:1 for Level 1, 2 for Level 2 and 3+ for Level 3)     Patient found standing in his room, just after completing a shower.  He is somewhat disheveled and still wet, wrapped in a sheet.  He has no complaints, however, and sits comfortably and engages appropriately.  He reports markedly decreased pain in his foot and hand after initiation of colchicine and prednisone by hospitalist service.  He denies experiencing auditory or visual hallucination and denies having bizarre thoughts such as running for president stating "I do not think so."  He reiterates that he has not been maintained chronically on medications but does continue to have an intermittent problem with alcohol which has caused issues in his life.  Reports that he last  worked the week before Christmas.  Denies thoughts of self-harm, suicide, or aggression.  Good appetite.  Denies medication side effects.    MAR: taking scheduled meds but refused folate this am; PRN for cough only    C.Medical History  Review of Systems  (Level 1 is none, Level 2 is 1, and Level 3 is 2+)  Psychiatric: As above. Does not appear depressed, sad, or anxious. Denies SI/HI. Denies intrusive thoughts. Eating well.  Better attention to ADLs. Sleeping well.  Constitutional: No complaints  Respiratory: No complaints  Musculoskeletal: Reports decreased pain in his left foot and right hand.    D.Additional Past Psychiatric, Substance Use, Medical, Family and Social History  Psychiatric: No new information available as compared to previous encounter(s)  Substance Use: No new information available as compared to previous encounter(s)  Medical: No new information available as compared to previous encounter(s)  Family: No new information available as compared to previous encounter(s)  Social: No new information available as compared to previous encounter(s)  Medications:   Current Medications  Report    Scheduled     Medication Ordered Dose/Rate, Route, Frequency Last Action    amLODIPine (NORVASC) tablet 10 mg 10 mg, PO, Daily Given, 10 mg at 01/13 0853    colchicine tablet 0.6 mg 0.6 mg, PO, Daily Given, 0.6 mg at 01/13 0853    folic acid (FOLVITE) tablet 1 mg 1 mg, PO, Daily Given, 1 mg at 01/13 0853    multivitamin tablet 1 tablet 1 tablet, PO, Daily Given, 1 tablet at 01/13 0853    OLANZapine (ZyPREXA) tablet 15 mg 15 mg, PO, QHS Given, 15 mg at 01/12 2101    predniSONE (DELTASONE) tablet 40 mg 40 mg, PO, QAM W/BREAKFAST Given by Other, 40 mg at 01/13 0852    propranolol (INDERAL) tablet 20 mg 20 mg, PO, Q8H SCH Given, 20 mg at 01/13 0549    thiamine (VITAMIN B1) tablet 100 mg 100 mg, PO, Daily Given, 100 mg at 01/13 0853          PRN     Medication Ordered Dose/Rate, Route, Frequency Last Action     acetaminophen (TYLENOL) suppository 650 mg (Or Linked Group #1) 650 mg, RE, Q6H PRN See Alternative, 01/08 2019    acetaminophen (TYLENOL) tablet 650 mg (Or Linked Group #1) 650 mg, PO, Q6H PRN Given, 650 mg at 01/08 2019    alum & mag hydroxide-simethicone (MAALOX PLUS) 200-200-20 mg/5 mL suspension 15 mL 15 mL, PO, Q4H PRN Given, 15 mL at 01/04 1703    benzocaine-menthol (CEPACOL) lozenge 1 lozenge 1 lozenge, BU, Q1H PRN Given, 1 lozenge at 01/12 2301    benzonatate (TESSALON) capsule 100 mg 100 mg, PO, TID PRN Ordered    dextromethorphan-guaiFENesin (MUCINEX DM) 30-600 MG per 12 hr tablet 1 tablet 1 tablet, PO, BID PRN Given, 1 tablet at 01/10 1113    guaiFENesin-codeine (ROBITUSSIN AC) 100-10 MG/5ML syrup 5 mL 5 mL, PO, Q6H PRN Given, 5 mL at 01/12 2301    haloperidol (HALDOL) tablet 5 mg 5 mg, PO, Q4H PRN Given, 5 mg at 01/09 0545    hydrALAZINE (APRESOLINE) injection 10 mg 10 mg, IV, Q6H PRN Ordered    hydrOXYzine (VISTARIL) capsule 25 mg 25 mg, PO, Q6H PRN Given, 25 mg at 01/12 0236    lidocaine (LIDODERM) 5 % 1 patch 1 patch, TD, Q24H PRN Ordered    ondansetron (ZOFRAN) tablet 4 mg 4 mg, PO, Q8H PRN Given, 4 mg at 01/04 1703                II. Examination   Vital signs reviewed:   Blood pressure 138/79, pulse (!) 58, temperature 97.3 F (36.3 C), temperature source Oral, resp. rate 14, height 1.803 m (5' 10.98"), weight 86.2 kg (190 lb 0.6 oz), SpO2 98 %.     Mental Status Exam  (Level 1 is 1-5, Level 2 is 6-8, Level 3 is 9+)  General appearance: Appears older than chronological age.  Dressed initiate, still wet from a shower.  Disheveled with long, tangled hair.  Sits on the edge of his bed and engages with the interviewer.  No pain behaviors.  Attitude/Behavior: Pleasant, appropriate, with reasonable eye contact.  Engaging.  Motor: Essential tremor bilaterally  Gait: Stands and appears to ambulate without impairment.  Muscle strength and tone: Grossly intact  Speech:   Spontaneous: Yes  Rate and Rhythm:  Normal  Volume: Normal  Tone: Monotonous  Clarity: Yes  Mood: Euthymic  Affect:  Range: Blunt  Thought Process:  Coherent, clear  Associations: Goal oriented  Thought Content:  No evidence of overt delusions.  No depressive cognitions.  Denies thoughts of self-harm, suicide, homicide, or violence.  No paranoia.  Perceptions: Hallucinations: No  Insight: Limited  Judgment: Poor  Cognition:   Level of Consciousness: Intact  Orientation: Intact to self, place and time    Psychiatric / Cognitive Instruments: None    Pertinent Physical Exam: Not relevant to current chief complaint/reason for admission    Imaging / EKG / Labs:   Labs in the last 24 hours  Results     ** No results found for the last 24 hours. **          III. Assessment and Plan (Medical Decision Making)     1. I certify that this patient continues to require inpatient hospitalization due to unable to care for self in the community with insufficient support available    2. 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 Past medical records reviewed. Coordination of care was discussed with inpatient team and as available with the outpatient team.    4. On this admission patient educated about and provided input into their treatment plan.  Patient understands potential risks and benefits of proposed treatment plan.     5.  Assessment / Impression   60 y.o. male, residing in a hotel for the past 4 years, unclear current employment,  with PMHx of HTN, essential tremors, initially admitted from ED to  medical unit on 09/30/2020 for alcohol withdrawal, AKI; on 10/02/2020, pt was admitted to psychiatry for a history of anxiety and alcohol use disorder that was admitted under a TDO in the setting of increased paranoia and SI over the last several weeks. TDO hearing on 10/03/2020; committed for up to 30 days. Tested + for SARS-CoV-2 on 10/07/2020.     Suicide Risk Assessment  Denies thoughts of self-harm, suicide, or aggression.  Increased prognostic risk factors include history of severe alcoholism with primary thought disorder (schizophrenia), poor social support, financial and domicile insecurity    1/10, by previous team: Patient is a poor historian and appears to have deficits in his memory. He exhibits paranoid and grandiose delusions of being followed and has been observed reacting to internal stimuli since being admitted. Has some history of features of bipolar although he does not presently appear to be in a manic or depressive state. Psychosis seems to be his primary issue. He has no known history of withdrawal sx from alcohol and has been scoring low on CIWA since admission. CIWA completed, alcohol withdrawal ruled out. Head CT w/o contrast did not show major intracranial abnormalities.Subjective complaints of psychosis have resolved but nursing observational reports show ongoing bizarre behaviors.He still has moments of paranoia, confusion and bizarre behavior. Improving but still cannot make steps into how he will care for himself, requires further acute inpatient psychiatric hospitalization for stabilization and then most likely disposition to a hotel, along with collaboration with his daughter.    10/12/2020: Clinical presentation is appearing most consistent with schizophrenia, chronic,  with severe alcohol use disorder.  His bizarre delusions seem persistent and minimally responsive to SGA to date.  He does not appear to be an alcohol withdrawal but medically is debilitated because of his gouty flare.   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  Plan to continue his current olanzapine at 50 mg at at bedtime.  Consulted with medicine for prednisone for his gouty symptoms given his AKI.    10/13/2020: Modest clinical improvement with decreased disorganization and without bizarre thoughts, improved attention and focus.  More active and ambulatory given his improvement of his gout symptoms with decreased pain.  Plan to continue his current medication regimen of 15 mg olanzapine at at bedtime.  Monitor closely for decompensation given the steroid taper.    Likely disposition will be returned to hotel where he was previously living and follow-up with Adult Protective Services.  Anticipate length of stay 5 to 7 days given the severity of his impaired judgment and inability to maintain his ADLs secondary to his underlying psychiatric and medical conditions.      Plan / Recommendations:  Biological Plan:  Medications:   Current Facility-Administered Medications   Medication Dose Route Frequency    amLODIPine  10 mg Oral Daily    colchicine  0.6 mg Oral Daily    folic acid  1 mg Oral Daily    multivitamin  1 tablet Oral Daily    OLANZapine  15 mg Oral QHS    predniSONE  40 mg Oral QAM W/BREAKFAST    propranolol  20 mg Oral Q8H SCH    thiamine  100 mg Oral Daily       Medical Work-up: None required at this time     Consults: Internal Medicine following gout and SARS-CoV-2. Fever spike on 10/08/2020.    Psychosocial Plan:  Individual therapy: Psycho-education and psychotherapy of the following modalities will continue to be provided during daily visits:Supportive     Group and milieu therapies: daily per unit's schedule.    Continue with Social Work intervention provided for discharge planning including assistance with placement, case management referral and follow-up psychiatric care.    Plan for Family Involvement:  sister      Other Providers Contact Information and Dates Contacted: No Updates    Total provider time spent 35 minutes (floor time) with more than 50 percent of time in direct patient contact, coordinating care and counseling.    Signed by: Claudean Severance, MD  10/13/2020  2:18 PM

## 2020-10-13 NOTE — Progress Notes (Signed)
Checklist instructions: Score the patient once a shift and as needed. Absence of a behavior results in a score of 0. Presence or increase in the display of a behavior results in a score of 1. Maximum score (SUM) possible is 6. If a behavior is at baseline for a patient, e.g. the patient is normally confused, this will result in a score of 0. An increase in confusion will result in a score of 1.      Kristopher Rogers is being assessed under the [place] Violence Checklist for any escalations in potentially violent or harmful behavior.     Date of Admission:   10/02/20                                                             Date of Assessment:  10/14/19   Assessment Time 08:00      Confused 0      Irritable 0      Boisterous 0      Verbal Threats 0      Physical Threats 0      Attacking Objects 0      SUM 0          TW attempted the following interventions based on the patient's sum total score assessed on the BVC: 1= Monitoring.      Should the patient require multiple interventions or fails to engage in de-escalation with staff, please review the plan for managing the care of an escalated patient.  Please do the following:  1.  Immediately notify Security and Nursing Administrative Supervisor/Director to respond  2.  Conduct team huddle to rule out clinical causes for escalated behavior  3.  Review the note authored by the Associate Professor for additional information  4.  Document specific behaviors observed  5.  Consider SAFE Team call for additional resources              The [place] Violence Checklist (BVC) is a 6 item inventory that was designed to assist in the prediction of imminent violent behavior (24 hours perspective) in healthcare and other sectors where workplace violence is acknowledged as a serious problem.   The BVC does not give instructions on what to do or what interventions to provide when a violent episode occurs; it is a took meant to aid in the risk assessment process. Interventions are based  on the individual, the environment, and cultural sensitivity indicators.

## 2020-10-13 NOTE — Progress Notes (Signed)
Daily Note:    Affect/Mood:  Constricted  Thought Process:  Blocking  Thought Content:  Religiously Preoccupied  Interpersonal:  Guarded    Treatment Plan Goal:  Remains safe during hospitalization. Attends minimum number of therapies daily.    Therapeutic Interventions:  There were no groups offered on the unit today due to COVID isolation. Therapist attempted to call pt via intercom for 1:1 and pt reported "I'm in prayer" and requested that the therapist call back later. Therapist will continue to reinforce pro-social behaviors, appropriate expression of feelings, and use of/efforts to use coping skills.       Kizzie Fantasia, MEd, LPC-R, NCC, Resident in Counseling

## 2020-10-13 NOTE — Progress Notes (Signed)
Checklist instructions: Score the patient once a shift and as needed. Absence of a behavior results in a score of 0. Presence or increase in the display of a behavior results in a score of 1. Maximum score (SUM) possible is 6. If a behavior is at baseline for a patient, e.g. the patient is normally confused, this will result in a score of 0. An increase in confusion will result in a score of 1.      Kristopher Rogers is being assessed under the Broset Violence Checklist for any escalations in potentially violent or harmful behavior.     Date of Admission:      10/02/20                                                                   Date of Assessment: 10/13/20   Assessment Time 2000      Confused 0      Irritable 0      Boisterous 0      Verbal Threats 0      Physical Threats 0      Attacking Objects 0      SUM 0          TW attempted the following interventions based on the patient's sum total score assessed on the BVC: 1=Monitoring    Should the patient require multiple interventions or fails to engage in de-escalation with staff, please review the plan for managing the care of an escalated patient.  Please do the following:  1.  Immediately notify Security and Nursing Administrative Supervisor/Director to respond  2.  Conduct team huddle to rule out clinical causes for escalated behavior  3.  Review the note authored by the Associate Professor for additional information  4.  Document specific behaviors observed  5.  Consider SAFE Team call for additional resources              The Broset Violence Checklist (BVC) is a 6 item inventory that was designed to assist in the prediction of imminent violent behavior (24 hours perspective) in healthcare and other sectors where workplace violence is acknowledged as a serious problem.   The BVC does not give instructions on what to do or what interventions to provide when a violent episode occurs; it is a took meant to aid in the risk assessment process. Interventions are  based on the individual, the environment, and cultural sensitivity indicators.

## 2020-10-14 MED ORDER — OLANZAPINE 10 MG PO TABS
20.0000 mg | ORAL_TABLET | Freq: Every evening | ORAL | Status: DC
Start: 2020-10-14 — End: 2020-10-21
  Administered 2020-10-14 – 2020-10-19 (×6): 20 mg via ORAL
  Filled 2020-10-14 (×6): qty 2

## 2020-10-14 NOTE — Progress Notes (Signed)
SW spoke with psychiatrist about this pt. Pt is not yet ready for discharge and will transfer back to Acute unit next week.

## 2020-10-14 NOTE — Plan of Care (Signed)
MEDICINE PROGRESS NOTE    Date Time: 10/14/20 12:20 PM  Patient Name: Kristopher Rogers  Attending Physician: Claudean Severance, MD    Assessment:       AKI (acute kidney injury) 10/07/2020    Suicidal behavior with attempted self-injury 10/03/2020    Bipolar I disorder, most recent episode manic, severe with psychotic features 10/03/2020    Alcohol withdrawal syndrome with complication 09/30/2020     60 year old male with no prior psychiatric history hypertension, essential tremors, admitted to medicine service 12/31 through 1/2 as TDO for paranoid and suicidal ideations, possible alcohol withdrawal, AKI, and transferred to inpatient psychiatry on 1/2. Medicine consulted on 1/6 for worsening renal function, with creatinine up to 1.7 from prior 1.1. Recently initiated on colchicine and Advil for possible gout flare versus hand trauma. Colchicine and NSAID placed on hold and creatinine improved. Patient was found to be positive for COVID on 1/7 after being tested after potential exposure. Febrile on 1/8, likely in setting of COVID. Concern for acute gout flare of R second MCP joint and initiated on prednisone, colchicine on 1/12.     Tested + for Covid 19 on 1/7 w/o hypoxia or fevers. Vaccination status unknown.  Chart check    Recommendations:   AKI, suspect related to NSAID, now resolved-> cr 0.7 now from peak of 1.7  SIRS criteria, suspect secondary to COVID, resolved  COVID positive,no hypoxia or fevers ( he actually did have a 101 fever on 1/8) .   Hypertension  Acute psychosis-> possibly Schizophrenic.   Hx gout with possible acute flare of 2nd R MCP   R hand trauma  Essential tremors on propranolol     Plan:   - Was resumed on colchicine 1/12 and Prednisone 40 mg daily, day 2/7 with reported improvement in sxs.  Cont for now.   - Given Covid + monitor for hypoxia and fevers-> so far none.  He needs eventual Covid vaccination regardless of testing + since  he is likely unvaccinated. The Prednisone may  be masking some of the Covid sxs as well.   - Uric acid level normal   - Cont to monitor renal fct.   - Bp  Ok cont current   - Will follow.       Physical Exam:     VITAL SIGNS PHYSICAL EXAM   Temp:  [97.5 F (36.4 C)-97.9 F (36.6 C)] 97.9 F (36.6 C)  Heart Rate:  [76-97] 84  Resp Rate:  [18] 18  BP: (114-155)/(74-93) 114/74  Blood Glucose:      No intake or output data in the 24 hours ending 10/14/20 1220        Meds:     Medications were reviewed:  Current Facility-Administered Medications   Medication Dose Route Frequency    amLODIPine  10 mg Oral Daily    colchicine  0.6 mg Oral Daily    folic acid  1 mg Oral Daily    multivitamin  1 tablet Oral Daily    OLANZapine  15 mg Oral QHS    predniSONE  40 mg Oral QAM W/BREAKFAST    propranolol  20 mg Oral Q8H SCH    thiamine  100 mg Oral Daily     Current Facility-Administered Medications   Medication Dose Route Frequency Last Rate     Current Facility-Administered Medications   Medication Dose Route    acetaminophen  650 mg Oral    Or    acetaminophen  650 mg Rectal  alum & mag hydroxide-simethicone  15 mL Oral    benzocaine-menthol  1 lozenge Buccal    benzonatate  100 mg Oral    dextromethorphan-guaiFENesin  1 tablet Oral    guaiFENesin-codeine  5 mL Oral    haloperidol  5 mg Oral    hydrALAZINE  10 mg Intravenous    hydrOXYzine  25 mg Oral    lidocaine  1 patch Transdermal    ondansetron  4 mg Oral         Labs:     Labs (last 72 hours):    Recent Labs   Lab 10/10/20  0701   WBC 4.70   Hgb 11.8*   Hematocrit 35.7*   Platelets 192          Recent Labs   Lab 10/10/20  0701 10/08/20  0705   Sodium 136 137   Potassium 3.8 3.9   Chloride 103 107   CO2 21* 21*   BUN 18.0 31.0*   Creatinine 0.7 1.3   Calcium 8.5 9.0   Albumin 2.8*  --    Protein, Total 5.7*  --    Bilirubin, Total 0.3  --    Alkaline Phosphatase 54  --    ALT 11  --    AST (SGOT) 17  --    Glucose 105* 115*                   Microbiology, reviewed and are significant  for:  Microbiology Results (last 15 days)     Procedure Component Value Units Date/Time    COVID-19 (SARS-CoV-2) and Influenza A/B & RSV (Cepheid)- Age less than 5 [[id number]]  (Abnormal) Collected: 10/07/20 2107    Order Status: Completed Specimen: Nasopharyngeal Swab Updated: 10/08/20 0034     Purpose of COVID testing Diagnostic -PUI     SARS-CoV-2 Specimen Source Nasal Swab     SARS CoV 2 Overall Result Positive     Comment: COVID results called to [facility] #[id number].  Readback confirmed by [id number] at 00:10, 10/08/2020.  Test performed using the Cepheid Xpert Xpress SARS-CoV-2 assay.  This assay is only for use under the Food and Drug  Administration's Emergency Use Authorization. This is a  real-time RT-PCR assay for the qualitative detection of  SARS-CoV-2 (COVID-19) RNA. Viral nucleic acids may persist in  vivo, independent of viability. Detection of viral nucleic acid  does not imply the presence of infectious virus, or that virus  nucleic acid is the cause of clinical symptoms.  Test performance has not been established for immunocompromised  patients or patients without signs and symptoms of respiratory  infection. Negative results do not preclude SARS-CoV-2infection  and should not be used as the sole basis for patient  management decisions.  Invalid results may be due to inhibiting  substances in the specimen and recollection should occur.    Please see Fact Sheets for patients and providers located at:  http://www.rice.biz/          Influenza A Negative     Influenza B Negative     Respiratory Syncytial Virus Negative     Comment: Test performed using the Cepheid assay.  Please see Fact Sheets for patients and providers located at:  http://www.rice.biz/    Test performed using the Cepheid Xpert Xpress SARS-CoV-2/Flu/RSV  assay. This assay is only for use under the Food and Drug  Administrations Emergency Use Authorization.  This is a multiplexed  real-time RT-PCR test intended  for the simultaneous qualitative  detection and differentiation of SARS-CoV-2, influenza A, influenza B,  and respiratory syncytial virus (RSV) viral RNA.  Viral nucleic acids  may persist in vivo, independent of viability. Detection of viral  nucleic acid does not imply the presence of infectious virus, or that  virus nucleic acid is the cause of clinical symptoms. If only one viral  target is positive but coinfection with multiple targets is suspected,  the sample should be re-tested with another FDA cleared, approved, or  authorized test, if coinfection would change clinical management.  Negative results do not preclude SARS-CoV-2, influenza A virus,  influenza B virus and/or RSV infection and should not be used as the  sole basis for treatment or other patient management decisions.  Invalid results may be due to inhibiting substances in the specimen  and recollection should occur.         Narrative:      o Collect and clearly label specimen type:  o PREFERRED-Upper respiratory specimen: One Nasopharyngeal Swab in  Transport Media.  o Hand deliver to laboratory ASAP  Diagnostic -PUI    COVID-19 (SARS-CoV-2) and Influenza A/B & RSV (Cepheid)- Age less than 5 [[id number]]     Order Status: Canceled Specimen: Nasopharyngeal Swab     COVID-19 (SARS-CoV-2) and Influenza A/B & RSV (Cepheid)- Age less than 5 [[id number]]     Order Status: Canceled Specimen: Nasopharyngeal Swab     COVID-19 (SARS-CoV-2) and Influenza A/B & RSV (Cepheid)- Age less than 5 [[id number]]     Order Status: Canceled Specimen: Nasopharyngeal Swab     COVID-19 (SARS-CoV-2) and Influenza A/B, NAA (Liat Rapid)- Admission [[id number]]     Order Status: Canceled Specimen: Culturette from Nasopharyngeal     COVID-19 (SARS-COV-2) (ID Now)- Behavioral health admission (no isolation) [[id number]] Collected: 09/30/20 0503    Order Status: Completed Specimen: Nasopharyngeal Swab from Nasopharynx Updated: 09/30/20 0532     Purpose of COVID testing Screening      SARS-CoV-2 Specimen Source Nasopharyngeal     SARS CoV 2 Overall Result Negative     Comment: Test performed using the Abbott ID NOW EUA assay.  Please see Fact Sheets for patients and providers located at:  http://www.rice.biz/    This test is for the qualitative detection of SARS-CoV-2  (COVID19) nucleic acid. Viral nucleic acids may persist in vivo,  independent of viability. Detection of viral nucleic acid does  not imply the presence of infectious virus, or that virus  nucleic acid is the cause of clinical symptoms. Negative  results should be treated as presumptive and, if inconsistent  with clinical signs and symptoms or necessary for patient  management, should be tested with an alternative molecular  assay. Negative results do not preclude SARS-CoV-2 infection  and should not be used as the sole basis for patient  management decisions. Invalid results may be due to inhibiting  substances in the specimen and recollection should occur.         Narrative:      o Collect and clearly label specimen type:  o Upper respiratory specimen: One Nasopharyngeal Dry Swab NO  Transport Media.  o Hand deliver to laboratory ASAP  Indication for testing->Behavioral health admission  Screening            Signed by: Tamela Oddi, MD

## 2020-10-14 NOTE — Progress Notes (Signed)
Daily Note:    Affect/Mood:  Hopeful  Thought Process:  Confused  Thought Content:  Within Normal Limits  Interpersonal:  Discussed Issues and Attentive    Treatment Plan Goal:  Remains safe during hospitalization. Attends minimum number of therapies daily.    Therapeutic Interventions:  No group therapy provided on the unit due to COVID isolation. The therapist met with the patient 1:1 via phone to offer support. 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 Therapist reported that she will be off during the weekend and pt replied "that's great, enjoy your weekend." Pt was calm and pleasant and appreciative of the check-in. Therapist will continue to reinforce pro-social behaviors, appropriate expression of feelings, and use of/efforts to use coping skills.       Kizzie Fantasia, MEd, LPC-R, NCC, Resident in Counseling

## 2020-10-14 NOTE — Discharge Instr - AVS First Page (Addendum)
[facility] Continuing Care Plan, including the After Visit Summary (AVS) and the Physicians Discharge Summary were can be viewed by Dr. Penni Homans who has access to Epic on 10/20/20.       RECOMMENDED THAT YOU COMPLY WITH THE FOLLOWING PLAN:   Follow up appointment with:  Dr. Dani Gobble                      Date:  10/24/20       Time:10:30 AM  Location:   Virtual (please log in through [facility])  P: [phone]    Follow up with: Orvil Feil, [facility], [facility] ( Discharge Planner- Joaquim Nam) offering mental health services  Date: 10/24/09  Time: 11AM  Location: Phone Call  Call: [phone]     RECOMMENDED THAT YOU COMPLY WITH THE FOLLOWING PLAN:     [facility]-   [phone], option 3    [facility]-  [facility] Entry and Referral Services [place] [facility]:  [address] [facility] [place], [place]  Monday through Friday- 9 a.m. to 5 p.m.  P: [phone], F: [phone]    FOR EMERGENCY MENTAL HEALTH or Substance Abuse Appointment, CONTACT [facility] at [phone] or your local community services board emergency mental health/substance abuse at: [facility] 24/7 Emergency Services [phone]      [facility]  [facility] services are currently available via telemedicine appointments. We also have limited in-person appointments available. We are not accepting walk-in appointments of any nature. Please call [phone] for additional information.    Are you, a friend or family member seeking urgent mental healthcare? Contact the [facility].    Call us at [phone].    About [facility])  [facility] provides a unique and vital resource to the [place] area by offering urgent psychiatric evaluations.    Adults, 18 years or older, in need of an urgent psychiatric assessment can  contact our [facility] office, Monday through Friday from 8 a.m. to 4 p.m.    While at [facility], a patient will meet with a triage team member and then one of our licensed clinicians, for example a Licensed Clinical Social Worker (LCSW), a Publishing rights manager (NP) or Psychiatrist, who will assess and make treatment recommendations. The treatment recommendations may include medication, individual therapy, a referral to one of our many behavioral health programs or hospitalization for stabilization.    Note: If you or someone you know is experiencing an acute crisis such as suicide or overdose please call 911 or go to the nearest emergency room.     Patient Information:   Please bring your ID, insurance card and a list of current medications. Payment is due at the time of service.  [facility] Providers do NOT prescribe controlled substances such as stimulants (e.g., Adderall and Ritalin), benzodiazepines (e.g., Xanax and Ativan), or opiates (e.g., Percocet, OxyContin).  We do not participate with [employer].  We recommend that you check your benefits coverage with your insurance company prior to coming to our facility. Payments are due at the time of service.  Due to the nature of our services and the varying wait times, we strongly encourage you to make child care arrangements prior to coming.      National Suicide Prevention Lifeline:  [phone]

## 2020-10-14 NOTE — Progress Notes (Signed)
Psychiatry Progress Note  (Level 1 = Problem Focused, Level 2 = Expanded Problem Focused, Level 3 = Detailed)    Patient Name: Kristopher Rogers              Current Date/Time:  1/14/20223:19 PM  MRN:  [id number]                              Attending Physician: Claudean Severance, MD  DOB: 01/03/1961                                Gender: male                              I. History   Informants: patient, patient chart, clinical staff    Appreciate internal medicine hospitalist follow-up.    Voicemail left with daughter in [place] Christine Schiefelbein @ [phone]) with patient consent.    A. Chief Complaint or Reason for Admission    Suicidal ideation, paranoia, bizarre behavior, inability to maintain ADLs; alcohol withdrawal    60 y.o. male, in a relationship, unstable housing, unclear current employment,  with PMHx of HTN, gout, essential tremors, initially admitted from ED to medical unit on 09/30/2020 for alcohol withdrawal, AKI; on 10/02/2020, pt was admitted to psychiatry for a history of anxiety and alcohol use disorder that was admitted under a TDO in the setting of increased paranoia and SI over the last several weeks. TDO hearing on 10/03/2020; committed for up to 30 days. Tested + for sars-cov-2 on 10/07/2020.     B.History of Present Illness: Interval History     (Symptoms and qualifiers:1 for Level 1, 2 for Level 2 and 3+ for Level 3)       Interdisciplinary Treatment Team (ITP) update held today with patient and clinical team.    Dressed appropriately in hospital attire today patient appears reasonably groomed and with adequate hygiene.  He is engaging with clinical team with a friendly and appropriate demeanor.  Further discussion of underlying issues which led to his hospitalization includes his admission that on at least 2 previous occasions he has suffered from paranoia.  He continues to deny that he has ever received medications.  He also continues to insist that his drinking is under control and he does not  require/is not interested in intensive outpatient programs or even tending AA.  His overall attention to ADLs and self-cares appears to have improved.  He is not overtly paranoid with team this morning; however, yesterday evening as noted he had significant paranoia and received as needed haloperidol.  Denies thoughts of self-harm, suicide, or aggression.  Good appetite.  Denies medication side effects.    MAR: taking scheduled meds but refused folate this am; PRN haloperidol and hydroxyzine for insomnia/paranoia.    C.Medical History  Review of Systems  (Level 1 is none, Level 2 is 1, and Level 3 is 2+)  Psychiatric: As above. Does not appear depressed, sad, or anxious. Denies SI/HI. Denies intrusive thoughts. Eating well.  Attending to ADLs.  Poor sleep overnight.  Denies auditory/visual hallucinations.  Denies feeling paranoid or anxious.  Denies intrusive thoughts.  Constitutional: No complaints  Respiratory: No complaints  Musculoskeletal: Reports decreased pain in his left foot and right hand.    D.Additional Past Psychiatric, Substance Use, Medical, Family and Social  History  Psychiatric: No new information available as compared to previous encounter(s)  Substance Use: No new information available as compared to previous encounter(s)  Medical: No new information available as compared to previous encounter(s)  Family: No new information available as compared to previous encounter(s)  Social: No new information available as compared to previous encounter(s)    Medications:   Current Medications  Report    Scheduled     Medication Ordered Dose/Rate, Route, Frequency Last Action    amLODIPine (NORVASC) tablet 10 mg 10 mg, PO, Daily Given, 10 mg at 01/14 0900    colchicine tablet 0.6 mg 0.6 mg, PO, Daily Given, 0.6 mg at 01/14 0900    folic acid (FOLVITE) tablet 1 mg 1 mg, PO, Daily Given, 1 mg at 01/14 0901    multivitamin tablet 1 tablet 1 tablet, PO, Daily Given, 1 tablet at 01/14 0900    OLANZapine (ZyPREXA)  tablet 15 mg 15 mg, PO, QHS Given, 15 mg at 01/13 2135    predniSONE (DELTASONE) tablet 40 mg 40 mg, PO, QAM W/BREAKFAST Given, 40 mg at 01/14 0901    propranolol (INDERAL) tablet 20 mg 20 mg, PO, Q8H SCH Given, 20 mg at 01/14 1610    thiamine (VITAMIN B1) tablet 100 mg 100 mg, PO, Daily Given, 100 mg at 01/14 0900          PRN     Medication Ordered Dose/Rate, Route, Frequency Last Action    acetaminophen (TYLENOL) suppository 650 mg (Or Linked Group #1) 650 mg, RE, Q6H PRN See Alternative, 01/08 2019    acetaminophen (TYLENOL) tablet 650 mg (Or Linked Group #1) 650 mg, PO, Q6H PRN Given, 650 mg at 01/08 2019    alum & mag hydroxide-simethicone (MAALOX PLUS) 200-200-20 mg/5 mL suspension 15 mL 15 mL, PO, Q4H PRN Given, 15 mL at 01/04 1703    benzocaine-menthol (CEPACOL) lozenge 1 lozenge 1 lozenge, BU, Q1H PRN Given, 1 lozenge at 01/12 2301    benzonatate (TESSALON) capsule 100 mg 100 mg, PO, TID PRN Ordered    dextromethorphan-guaiFENesin (MUCINEX DM) 30-600 MG per 12 hr tablet 1 tablet 1 tablet, PO, BID PRN Given, 1 tablet at 01/10 1113    guaiFENesin-codeine (ROBITUSSIN AC) 100-10 MG/5ML syrup 5 mL 5 mL, PO, Q6H PRN Given, 5 mL at 01/12 2301    haloperidol (HALDOL) tablet 5 mg 5 mg, PO, Q4H PRN Given, 5 mg at 01/14 0043    hydrALAZINE (APRESOLINE) injection 10 mg 10 mg, IV, Q6H PRN Ordered    hydrOXYzine (VISTARIL) capsule 25 mg 25 mg, PO, Q6H PRN Given, 25 mg at 01/14 0043    lidocaine (LIDODERM) 5 % 1 patch 1 patch, TD, Q24H PRN Ordered    ondansetron (ZOFRAN) tablet 4 mg 4 mg, PO, Q8H PRN Given, 4 mg at 01/04 1703                II. Examination   Vital signs reviewed:   Blood pressure 114/74, pulse 84, temperature 97.9 F (36.6 C), temperature source Oral, resp. rate 18, height 1.803 m (5' 10.98"), weight 86.2 kg (190 lb 0.6 oz), SpO2 98 %.     Mental Status Exam  (Level 1 is 1-5, Level 2 is 6-8, Level 3 is 9+)  General appearance: Dressed appropriately in hospital attire.  Good hygiene and reasonable  grooming. No pain behaviors.    Attitude/Behavior: Pleasant, appropriate, with reasonable eye contact.  Engaging.  Motor: Essential tremor bilaterally  Gait: Ambulates without  impairment.  Muscle strength and tone: Grossly intact  Speech:   Spontaneous: Yes  Rate and Rhythm: Normal  Volume: Normal  Tone: Monotonous  Clarity: Yes  Mood: Euthymic  Affect:  Constricted, reactive.  Thought Process:  Coherent, clear  Associations: Goal oriented  Thought Content:  No evidence of overt delusions.  No depressive cognitions.  Denies thoughts of self-harm, suicide, homicide, or violence.  No paranoia.  Perceptions: Hallucinations: No  Insight: Limited  Judgment: Poor  Cognition:   Level of Consciousness: Intact  Orientation: Intact to self, place and time    Psychiatric / Cognitive Instruments: None    Pertinent Physical Exam: Not relevant to current chief complaint/reason for admission    Imaging / EKG / Labs:   Labs in the last 24 hours  Results     ** No results found for the last 24 hours. **          III. Assessment and Plan (Medical Decision Making)     1. I certify that this patient continues to require inpatient hospitalization due to unable to care for self in the community with insufficient support available    2. 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 Past medical records reviewed. Coordination of care was discussed with inpatient team and as available with the outpatient team.    4. On this admission patient educated about and provided input into  their treatment plan.  Patient understands potential risks and benefits of proposed treatment plan.     5.  Assessment / Impression   60 y.o. male, residing in a hotel for the past 4 years, unclear current employment,  with PMHx of HTN, essential tremors, initially admitted from ED to medical unit on 09/30/2020 for alcohol withdrawal, AKI; on 10/02/2020, pt was admitted to psychiatry for a history of anxiety and alcohol use disorder that was admitted under a TDO in the setting of increased paranoia and SI over the last several weeks. TDO hearing on 10/03/2020; committed for up to 30 days. Tested + for SARS-CoV-2 on 10/07/2020.     Suicide Risk Assessment  Denies thoughts of self-harm, suicide, or aggression.  Increased prognostic risk factors include history of severe alcoholism with primary thought disorder (schizophrenia), poor social support, financial and domicile insecurity    1/10, by previous team: Patient is a poor historian and appears to have deficits in his memory. He exhibits paranoid and grandiose delusions of being followed and has been observed reacting to internal stimuli since being admitted. Has some history of features of bipolar although he does not presently appear to be in a manic or depressive state. Psychosis seems to be his primary issue. He has no known history of withdrawal sx from alcohol and has been scoring low on CIWA since admission. CIWA completed, alcohol withdrawal ruled out. Head CT w/o contrast did not show major intracranial abnormalities.Subjective complaints of psychosis have resolved but nursing observational reports show ongoing bizarre behaviors.He still has moments of paranoia, confusion and bizarre behavior. Improving but still cannot make steps into how he will care for himself, requires  further acute inpatient psychiatric hospitalization for stabilization and then most likely disposition to a hotel, along with collaboration with his daughter.    10/12/2020: Clinical  presentation is appearing most consistent with schizophrenia, chronic, with severe alcohol use disorder.  His bizarre delusions seem persistent and minimally responsive to SGA to date.  He does not appear to be an alcohol withdrawal but medically is debilitated because of his gouty flare.  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  Plan to continue his current olanzapine at 50 mg at at bedtime.  Consulted with medicine for prednisone for his gouty symptoms given his AKI.    10/13/2020: Modest clinical improvement with decreased disorganization and without bizarre thoughts, improved attention and focus.  More active and ambulatory given his improvement of his gout symptoms with decreased pain.  Plan to continue his current medication regimen of 15 mg olanzapine at at bedtime.  Monitor closely for decompensation given the steroid taper.    10/14/2020: Stable mood with good focus and attention and no overt paranoia or evidence of psychosis; however, continues to make odd references suggestive of chronic underlying thought disorder.  He appears to be experiencing anxiety likely secondary to the prednisone for his gout.  Plan to increase olanzapine to 20 mg at at bedtime and obtain collateral information from his daughter (which he has provided consent).  Anticipate likely will require transfer to 6 S. or 6 N. when cleared from COVID isolation prior to disposition from the hospital.  Patient provides consent to speak with his daughter living in [place].    Likely disposition will be returned to hotel where he was previously living and follow-up with Adult Protective Services.  Anticipate length of stay 5 to 7 days given the severity of his impaired judgment and inability to maintain his ADLs secondary to his underlying psychiatric and medical conditions.      Plan / Recommendations:  Biological Plan:  Medications:    Current Facility-Administered Medications   Medication Dose Route Frequency    amLODIPine  10 mg Oral Daily    colchicine  0.6 mg Oral Daily    folic acid  1 mg Oral Daily    multivitamin  1 tablet Oral Daily    OLANZapine  15 mg Oral QHS    predniSONE  40 mg Oral QAM W/BREAKFAST    propranolol  20 mg Oral Q8H SCH    thiamine  100 mg Oral Daily       Medical Work-up:  Patient on colchicine and 7 day prednisone bolus for gout. Asymptomatic from SARS-CoV-2.    Consults: Internal Medicine following gout and SARS-CoV-2. Fever spike on 10/08/2020.    Psychosocial Plan:  Individual therapy: Psycho-education and psychotherapy of the following modalities will continue to be provided during daily visits:Supportive     Group and milieu therapies: daily per unit's schedule.    Continue with Social Work intervention provided for discharge planning including assistance with placement, case management referral and follow-up psychiatric care.    Plan for Family Involvement: sister      Other Providers Contact Information and Dates Contacted: No Updates    Total provider time spent 35 minutes (floor time) with more than 50 percent of time in direct patient contact, coordinating care and counseling.    Signed by: Claudean Severance, MD  10/14/2020  3:19 PM

## 2020-10-14 NOTE — Progress Notes (Signed)
RT following pt from previous unit (acute) and continues to provide resources, as pt is unable to attend group therapy sessions.Today RT provided pt with an informative article and true or false quiz on the topic of astronomy, as pt previously identified this as a leisure interest. RT also provided pt with suduko puzzles, crossword puzzle, and a Education administrator. RT will continue to follow pt throughout length of stay and provide RT services.

## 2020-10-14 NOTE — Plan of Care (Signed)
Problem: Addiction to alcohol or opioids or other substances AS EVIDENCED BY:  Goal: Will identify long and short term goals based on individual needs and strengths  Outcome: Progressing     Problem: Addiction to alcohol or opioids or other substances AS EVIDENCED BY:  Goal: Patient educated about the disease of addiction and recovery and identifies long-term goal  Outcome: Progressing     Problem: Addiction to alcohol or opioids or other substances AS EVIDENCED BY:  Goal: Patient develops a discharge plan  Outcome: Progressing   Kristopher Rogers reports that the goal for today 10/14/2020 is to  "To go to the meeting." He was watching TV, pleasant on approach. He denies SI/HI/AVH, anxiety or depression at this time. He is calm and cooperative with care, med compliant. He is on COVID19 isolation protocol since 10/07/20 and no sign symptoms of   COVID19 Vitals WNL. Safety maintained and continue to monitor for any changes.

## 2020-10-15 DIAGNOSIS — F29 Unspecified psychosis not due to a substance or known physiological condition: Secondary | ICD-10-CM | POA: Diagnosis present

## 2020-10-15 NOTE — Plan of Care (Signed)
MEDICINE CONSULT FOLLOW-UP    Date Time: 10/15/20 3:06 PM  Patient Name: Kristopher Rogers  Attending Physician: Claudean Severance, MD  Consulting Physician: Tamela Oddi, MD, MD      Assessment:     Patient Active Problem List    Diagnosis Date Noted    Schizophrenia spectrum disorder with psychotic disorder type not yet determined 10/15/2020    AKI (acute kidney injury) 10/07/2020    Alcohol withdrawal syndrome with complication 09/30/2020          AKI (acute kidney injury) 10/07/2020    Suicidal behavior with attempted self-injury 10/03/2020    Bipolar I disorder, most recent episode manic, severe with psychotic features 10/03/2020    Alcohol withdrawal syndrome with complication 09/30/2020     60 year old male with no prior psychiatric history hypertension, essential tremors, admitted to medicine service 12/31 through 1/2 as TDO for paranoid and suicidal ideations, possible alcohol withdrawal, AKI, and transferred to inpatient psychiatry on 1/2. Medicine consulted on 1/6 for worsening renal function, with creatinine up to 1.7 from prior 1.1. Recently initiated on colchicine and Advil for possible gout flare versus hand trauma. Colchicine and NSAID placed on hold and creatinineimproved. Patient was found to be positive for COVID on 1/7 after being tested after potential exposure. Febrile on 1/8, likely in setting of COVID.Concern for acute gout flare of R second MCP joint and initiated on prednisone, colchicine on 1/12.    Tested + for Covid 19 on 1/7 w/o hypoxia or fevers. Vaccination status unknown.  Chart check    1/15: Vitals remain stable. Sat 99% RA. Blood work was ordered for this am but was not collected ( ? Refused?). Pt is again with psychotic and paranoid ideations and refused to take am meds per RN and also walked to hallway w/o mask.  Remains on Covid 19 isolation protocol since 1/7 and remains asymptomatic beyond fever on 1/8.       Recommendations:   AKI, suspect related to  NSAID, now resolved-> cr 0.7 now from peak of 1.7  SIRS criteria, suspect secondary to COVID, resolved  COVID positive,no hypoxia or fevers ( he actually did have a 101 fever on 1/8) .   Hypertension  Acute psychosis-> possibly Schizophrenic.   Hx goutwith possible acute flare of 2nd R MCP   R hand trauma  Essential tremors on propranolol     Plan:   - Was resumed on colchicine 1/13 ( day 3/7)  and Prednisone 40 mg daily ( day 4/7) with reported improvement in sxs.  Cont for now.   - Given Covid + monitor for hypoxia and fevers-> so far none.  He needs eventual Covid vaccination regardless of testing + since  he is likely unvaccinated. The Prednisone may be masking some of the Covid sxs as well.   - Uric acid level normal   - Cont to monitor renal fct -> obtain the bmp when pt is agreeable.   - Bp ok cont current   - Will follow.               Physical Exam:     Patient Vitals for the past 24 hrs:   BP Temp Temp src Pulse SpO2   10/15/20 1400 147/81 97.9 F (36.6 C) Oral (!) 108 98 %   10/15/20 0801 134/80 98.2 F (36.8 C) Oral 80 99 %   10/14/20 1946 117/75 97.3 F (36.3 C) Oral 73 97 %   10/14/20 1528 119/83 97.2 F (36.2 C)  Oral 94 99 %     Body mass index is 26.52 kg/m.  No intake or output data in the 24 hours ending 10/15/20 1506      Meds:     Medications were reviewed:    Labs:   No results for input(s): WBC, HGB, HCT, PLT in the last 72 hours.    No results for input(s): NA, K, CL, CO2, BUN, CREAT, GLU, CA, MG, PHOS in the last 72 hours.    No results for input(s): AST, ALT, ALKPHOS, PROT, ALB in the last 72 hours.    No results for input(s): PTT, PT, INR in the last 72 hours.      Signed by: Tamela Oddi, MD, MD

## 2020-10-15 NOTE — Progress Notes (Signed)
Psychiatry Progress Note  (Level 1 = Problem Focused, Level 2 = Expanded Problem Focused, Level 3 = Detailed)    Patient Name: Kristopher Rogers              Current Date/Time:  1/15/20228:01 AM  MRN:  [id number]                              Attending Physician: Claudean Severance, MD  DOB: 1961-04-02                                Gender: male                              I. History   Informants: patient, chart-review, clinical staff  A. Chief Complaint or Reason for Admission       CC " I do not have psychiatric illness"    B.History of Present Illness: Interval History     (Symptoms and qualifiers:1 for Level 1, 2 for Level 2 and 3+ for Level 3)  Patient is a 60 y.o. male, in a relationship, unstable housing, unclear current employment,  with PMHx of HTN, essential tremors, initially admitted from ED to medical unit on 09/30/2020 for alcohol withdrawal, AKI; on 10/02/2020, pt was admitted to psychiatry for a history of anxiety and alcohol use disorder that was admitted under a TDO in the setting of increased paranoia and SI over the last several weeks. TDO hearing on 10/03/2020; committed for up to 30 days. Tested + for sars-cov-2 on 10/07/2020.     MAR: compliant with scheduled meds; PRN cepacol lozenge x1  given in the past 24 hours.   Per nightshift RN noted:, pt was noted to be anxious s/p paranoia about a gun shot    Today, patient was seen after breakfast. Reported he is upset we offered him medications this morning and "I do not have mental illness". Pt reported the military had informed the judge that he needed to be in the hospital. Pt reported he is anxious and disturbed that a lot of people are coming to his room since admission but he would like to sleep and "think".  Pt was dismissive, agreed to try taking the meds as again if they were provided to him within their own wrap. Angry. Denied AH, VH, SI, HI.    Daughter Burnell Blanks [place]  [phone])  - patient has 3 kids  (daughter 27; sons 27&26 ( one brother  live in [place], the other [place]).   - denied any child passing away (pt was talking about recently losing a son.  - was married only once.   - daughter reported the only mental illness she is aware of : her father's heavy drinking in the last decade. She has not heard from other family members of other psychiatric history.   - provided brief update , and unclear disposition plans.  - daughter will be talking with her brothers, and will be ready to speak with care team in the upcoming week.      C.Medical History  Review of Systems  (Level 1 is none, Level 2 is 1, and Level 3 is 2+)  Psychiatric: Psychosis  Constitutional: No complaints    D.Additional Past Psychiatric, Substance Use, Medical, Family and Social History  Psychiatric: No new information available as compared  to previous encounter(s)  Substance Use: No new information available as compared to previous encounter(s)  Medical: No new information available as compared to previous encounter(s)  Family: No new information available as compared to previous encounter(s)  Social: No new information available as compared to previous encounter(s)    Medications:   Current Medications  Report    Scheduled     Medication Ordered Dose/Rate, Route, Frequency Last Action    amLODIPine (NORVASC) tablet 10 mg 10 mg, PO, Daily Given, 10 mg at 01/15 0851    colchicine tablet 0.6 mg 0.6 mg, PO, Daily Given, 0.6 mg at 01/15 0851    folic acid (FOLVITE) tablet 1 mg 1 mg, PO, Daily Given, 1 mg at 01/15 0851    multivitamin tablet 1 tablet 1 tablet, PO, Daily Given, 1 tablet at 01/15 0851    OLANZapine (ZyPREXA) tablet 20 mg 20 mg, PO, QHS Given, 20 mg at 01/14 2102    predniSONE (DELTASONE) tablet 40 mg 40 mg, PO, QAM W/BREAKFAST Given, 40 mg at 01/15 0852    propranolol (INDERAL) tablet 20 mg 20 mg, PO, Q8H SCH Given, 20 mg at 01/15 1422    thiamine (VITAMIN B1) tablet 100 mg 100 mg, PO, Daily Given, 100 mg at 01/15 0852          PRN     Medication Ordered Dose/Rate, Route,  Frequency Last Action    acetaminophen (TYLENOL) suppository 650 mg (Or Linked Group #1) 650 mg, RE, Q6H PRN See Alternative, 01/14 2256    acetaminophen (TYLENOL) tablet 650 mg (Or Linked Group #1) 650 mg, PO, Q6H PRN Given, 650 mg at 01/08 2019    alum & mag hydroxide-simethicone (MAALOX PLUS) 200-200-20 mg/5 mL suspension 15 mL 15 mL, PO, Q4H PRN Given, 15 mL at 01/04 1703    benzocaine-menthol (CEPACOL) lozenge 1 lozenge 1 lozenge, BU, Q1H PRN Given, 1 lozenge at 01/14 2104    benzonatate (TESSALON) capsule 100 mg 100 mg, PO, TID PRN Ordered    dextromethorphan-guaiFENesin (MUCINEX DM) 30-600 MG per 12 hr tablet 1 tablet 1 tablet, PO, BID PRN Given, 1 tablet at 01/10 1113    guaiFENesin-codeine (ROBITUSSIN AC) 100-10 MG/5ML syrup 5 mL 5 mL, PO, Q6H PRN Given, 5 mL at 01/12 2301    haloperidol (HALDOL) tablet 5 mg 5 mg, PO, Q4H PRN Given, 5 mg at 01/14 0043    hydrALAZINE (APRESOLINE) injection 10 mg 10 mg, IV, Q6H PRN Ordered    hydrOXYzine (VISTARIL) capsule 25 mg 25 mg, PO, Q6H PRN Given, 25 mg at 01/14 0043    lidocaine (LIDODERM) 5 % 1 patch 1 patch, TD, Q24H PRN Ordered    ondansetron (ZOFRAN) tablet 4 mg 4 mg, PO, Q8H PRN Given, 4 mg at 01/04 1703                II. Examination   Vital signs reviewed:   Blood pressure 147/81, pulse (!) 108, temperature 97.9 F (36.6 C), temperature source Oral, resp. rate 18, height 1.803 m (5' 10.98"), weight 86.2 kg (190 lb 0.6 oz), SpO2 98 %.     Mental Status Exam  (Level 1 is 1-5, Level 2 is 6-8, Level 3 is 9+)  General appearance: Appears chronological age and fair hygiene; wearing pt attire gown, resting on bed, fully awake  Attitude/Behavior: Irritable, Uncooperative, Eye Contact is  Good and Suspicious   Motor: No abnormalities noted  Gait: Not observed  Muscle strength and tone: Not tested  Speech:   Spontaneous: Yes  Rate and Rhythm: Normal  Volume: Normal  Tone: irritable  Clarity: Yes  Mood: angry  Affect:   Range: Blunt  Thought Process:   Coherent:  Yes  Logical:  Yes at times  Associations: Circumstantial  Thought Content:   Delusions:  Yes  Depressive Cognitions:  Helpless  Suicidal:  No suicidal thoughts  Homicidal:  No homicidal thoughts  Violent Thoughts:  No  Perceptions:   Hallucinations: No  Insight: Poor  Judgment: Poor  Cognition:   Level of Consciousness: Intact  Orientation: Intact to self, place and time    Psychiatric / Cognitive Instruments: None    Pertinent Physical Exam: Not relevant to current chief complaint/reason for admission    Imaging / EKG / Labs:   Labs in the last 24 hours  Results     ** No results found for the last 24 hours. **          III. Assessment and Plan (Medical Decision Making)     1. I certify that this patient continues to require inpatient hospitalization due to unable to care for self in the community with insufficient support available    2. Psychiatric Diagnoses:    Provisional: F20.9 Schizophrenia, multiple episodes, currently in acute episode    F10.20 Alcohol use disorder, severe, in a controlled environment  F10.232 Alcohol withdrawal, with perceptual disturbance, resolved    Rule out F10.26 Alcohol induced major neurocognitive disorder, nonamnestic-confabulatory type      Medical concerns:   Essential Tremors   Hypertension   Gout   R hand trauma   AKI, suspect related to NSAID, now resolved   Covid-19 Positive (on 10/07/2020)      3.All diagnostic procedures completed since admission were reviewed. Past medical records reviewed. Coordination of care was discussed with inpatient team and as available with the outpatient team.    4. On this admission patient educated about and provided input into their treatment plan.  Patient understands potential risks and benefits of proposed treatment plan.     5.  Assessment / Impression  Patient is a 60 y.o. male, in a relationship, residing in a hotel for the past 4 years, unclear current employment,  with PMHx of HTN, essential tremors, initially admitted from ED  to medical unit on 09/30/2020 for alcohol withdrawal, AKI; on 10/02/2020, pt was admitted to psychiatry for a history of anxiety and alcohol use disorder that was admitted under a TDO in the setting of increased paranoia and SI over the last several weeks. TDO hearing on 10/03/2020; committed for up to 30 days. Tested + for sars-cov-2 on 10/07/2020.     Suicide Risk Assessment  - denied SI    1/14, by attending: Stable mood with good focus and attention and no overt paranoia or evidence of psychosis; however, continues to make odd references suggestive of chronic underlying thought disorder.  He appears to be experiencing anxiety likely secondary to the prednisone for his gout.  Plan to increase olanzapine to 20 mg at at bedtime and obtain collateral information from his daughter (which he has provided consent).  Anticipate likely will require transfer to 6 S. or 6 N. when cleared from COVID isolation prior to disposition from the hospital.  Patient provides consent to speak with his daughter living in [place].          1/15: Angry, suspicious, anxious, disturbed, despite his denial of AH, VH; these might be related to paranoia, and hallucinations; did  not appear to be responding to stimuli. Continue medications as listed below. Olanzapine was increased to 20mg  po qhs on 1/14; which was helpful for patient to sleep at night.  Pt continues to require inpatient hospitalization for treatment of his psychosis, and until appropriate discharge plan is coordinated .  Updated daughter who is not aware of primary psychotic illness; she wishes to speak with care team re disposition plans in the upcoming week.Plant to transfer to 6S if bed opens up on 1/17 s/p completion of quarantine.    Plan / Recommendations:  Biological Plan:  Medications:   Current Facility-Administered Medications   Medication Dose Route Frequency   . amLODIPine  10 mg Oral Daily   . colchicine  0.6 mg Oral Daily   . folic acid  1 mg Oral Daily   .  multivitamin  1 tablet Oral Daily   . OLANZapine  20 mg Oral QHS   . predniSONE  40 mg Oral QAM W/BREAKFAST   . propranolol  20 mg Oral Q8H SCH   . thiamine  100 mg Oral Daily       Medical Work-up: None required at this time     Consults: Internal Medicine note, appreciated.     sychosocial Plan:  Individual therapy: Psycho-education and psychotherapy of the following modalities will continue to be provided during daily visits:Supportive     Group and milieu therapies: daily per unit's schedule.    Continue with Social Work intervention provided for discharge planning including assistance with placement, case management referral and follow-up psychiatric care.    Plan for Family Involvement: updated daughter, most recent on 1/15     Other Providers Contact Information and Dates Contacted: No Updates    Total provider time spent 35 minutes (floor time) with more than 50 percent of time in direct patient contact, coordinating care and counseling.    Signed by: Cecille Rubin, NP  10/15/2020  3:06 PM

## 2020-10-15 NOTE — Plan of Care (Addendum)
Several times tonight, Kristopher Rogers was located during rounding standing in the dark in his bathroom.  He inquired once during this time about where the "gun shot from the high caliber weapon" came from.  He had expressed concerns earlier to a [employer] that the [employer] had come to ask him questions.  He was visibly anxious.  When education given about the problem with standing in one place for extended periods of time in a room with hard surfaces and about the need to sleep, Kristopher Rogers was receptive to reassurance that the staff was working to maintain his safety.  He said he trusted the staff.  He did lay down immediately and appeared to fall asleep, sleeping from 0130 until at least 7 am.     Kristopher Rogers walked the halls of the unit occasionally, early in the shift and without a mask.  He was redirectable with difficulty and  was appropriate in response to the redirection.  He asked for "a hot breakfast" at 8 pm.  He was given a hot meal and re-oriented.  He denies SI.  He declined offers of haldol PRN and vistaril PRN.        Problem: At Risk for Harm to Self: AS EVIDENCED BY...  Goal: Will identify long and short term goals based on individual needs and strengths  10/15/2020 0439 by Aquilla Solian, RN  Outcome: Progressing  10/15/2020 0432 by Aquilla Solian, RN  Outcome: Progressing  Goal: Reduction of self-harm thoughts and no attempts  10/15/2020 0439 by Aquilla Solian, RN  Outcome: Progressing  10/15/2020 0432 by Aquilla Solian, RN  Outcome: Progressing  Goal: Identifies stressors, protective factors, and coping strategies  10/15/2020 0439 by Aquilla Solian, RN  Outcome: Progressing  10/15/2020 0432 by Aquilla Solian, RN  Outcome: Progressing  Goal: Verbalizes understanding of medication, benefits, and side effects  10/15/2020 0439 by Aquilla Solian, RN  Outcome: Progressing  10/15/2020 0432 by Aquilla Solian, RN  Outcome: Progressing  Goal: Identify and participate in supportive program therapies  10/15/2020 0439 by  Aquilla Solian, RN  Outcome: Progressing  10/15/2020 0432 by Aquilla Solian, RN  Outcome: Progressing  Goal: Completes discharge safety and recovery plan  10/15/2020 0439 by Aquilla Solian, RN  Outcome: Not Progressing  10/15/2020 0432 by Aquilla Solian, RN  Outcome: Progressing       Problem: Thought Disorder  Goal: Demonstrates orientation to person place and time  Outcome: Not Progressing  Goal: Verbalizes reduction in hallucinations/delusions  Outcome: Not Progressing

## 2020-10-15 NOTE — Plan of Care (Signed)
Problem: At Risk for Harm to Self: AS EVIDENCED BY...  Goal: Will identify long and short term goals based on individual needs and strengths  Outcome: Progressing  Goal: Reduction of self-harm thoughts and no attempts  Outcome: Progressing  Goal: Identifies stressors, protective factors, and coping strategies  Outcome: Progressing  Goal: Identify and participate in supportive program therapies  Outcome: Progressing     Kee was laying in bed when he was received. He is A+O x to person and place. Pt was anxious, paranoid and showed bizarre behavior. Pt is delusional, circumstantial and suspicious. His mood anxious and affect is blunt. Pt denies SI/HI/AVH/SIB and Pain. He was talking to self when TW entered his room and he said "hold of a minute let me finish my phone conversation even though he was talking to self without a phone". Pt said his day went well and he is improving day by day. He is med compliant and he made his needs known to staff. Later after one hour when TW entered his room; pt said I see so many poor people outside my room pointing his hand toward his window and asked TW to offer them with some snacks." Pt remained isolative to his room; following directing, he is calm and cooperative; no aggressive behavior noted and safety is maintained.

## 2020-10-15 NOTE — Progress Notes (Signed)
Checklist instructions: Score the patient once a shift and as needed. Absence of a behavior results in a score of 0. Presence or increase in the display of a behavior results in a score of 1. Maximum score (SUM) possible is 6. If a behavior is at baseline for a patient, e.g. the patient is normally confused, this will result in a score of 0. An increase in confusion will result in a score of 1.      Kristopher Rogers is being assessed under the Broset Violence Checklist for any escalations in potentially violent or harmful behavior.     Date of Admission:                             10/15/20                                            Date of Assessment:    Assessment Time 2000      Confused 0      Irritable 0      Boisterous 0      Verbal Threats 0      Physical Threats 0      Attacking Objects 0      SUM 0          TW attempted the following interventions based on the patient's sum total score assessed on the BVC: 1= Monitoring.      Should the patient require multiple interventions or fails to engage in de-escalation with staff, please review the plan for managing the care of an escalated patient.  Please do the following:  1.  Immediately notify Security and Nursing Administrative Supervisor/Director to respond  2.  Conduct team huddle to rule out clinical causes for escalated behavior  3.  Review the note authored by the Associate Professor for additional information  4.  Document specific behaviors observed  5.  Consider SAFE Team call for additional resources      The Broset Violence Checklist (BVC) is a 6 item inventory that was designed to assist in the prediction of imminent violent behavior (24 hours perspective) in healthcare and other sectors where workplace violence is acknowledged as a serious problem.   The BVC does not give instructions on what to do or what interventions to provide when a violent episode occurs; it is a took meant to aid in the risk assessment process. Interventions are based on the  individual, the environment, and cultural sensitivity indicators.

## 2020-10-15 NOTE — Plan of Care (Addendum)
Problem: At Risk for Harm to Self: AS EVIDENCED BY...  Goal: Will identify long and short term goals based on individual needs and strengths  Outcome: Progressing     Problem: At Risk for Harm to Self: AS EVIDENCED BY...  Goal: Reduction of self-harm thoughts and no attempts  Outcome: Progressing     Problem: At Risk for Harm to Self: AS EVIDENCED BY...  Goal: Identifies stressors, protective factors, and coping strategies  Outcome: Progressing   Clayburn reports that the goal for today 10/15/2020 is to  "To rest and chill." This AM he initially refused to take his scheduled medications  even with education also with the help of another RN and he was so concrete about it and that he wants to see the package and also wants to talk to the doctor. Finally he took his medications with a lot of time, education and encouragement. He denies SI/HI/AVH, anxiety or depression but at times he get confused and think that his daughter is dead, Clinical research associate assured him that she is alive and that she called yesterday. He makes his needs know and after he took his medication he asked for ice water and cranberry juice, and he was provided. Safety maintained and continue to monitor for any changes.     He is on COVID19 isolation protocol since 10/07/20 and no sign symptoms of COVID19 Vitals WNL.

## 2020-10-15 NOTE — Progress Notes (Signed)
Daily Note:    Treatment Plan Goal:  Remain safe during hospitalization.     Patient Participation in Groups:  There are no groups provided on the unit due to COVID isolation. The therapist checked-in with the patient via phone to offer support. Pt reported feeling "good, stated that "I don't need therapy today" " and declined the need of art or recreational materials. Therapist will continue to be available to pt as needed for emotional support, reinforce pro-social behaviors, and encourage use of/efforts to use coping skills.

## 2020-10-15 NOTE — Progress Notes (Signed)
Checklist instructions: Score the patient once a shift and as needed. Absence of a behavior results in a score of 0. Presence or increase in the display of a behavior results in a score of 1. Maximum score (SUM) possible is 6. If a behavior is at baseline for a patient, e.g. the patient is normally confused, this will result in a score of 0. An increase in confusion will result in a score of 1.      Kristopher Rogers is being assessed under the Broset Violence Checklist for any escalations in potentially violent or harmful behavior.     Date of Admission:                                                                         Date of Assessment:    Assessment Time 800      Confused 0      Irritable 0      Boisterous 0      Verbal Threats 0      Physical Threats 0      Attacking Objects 0      SUM 0          TW attempted the following interventions based on the patient's sum total score assessed on the BVC: 1= Monitoring.      Should the patient require multiple interventions or fails to engage in de-escalation with staff, please review the plan for managing the care of an escalated patient.  Please do the following:  1.  Immediately notify Security and Nursing Administrative Supervisor/Director to respond  2.  Conduct team huddle to rule out clinical causes for escalated behavior  3.  Review the note authored by the Associate Professor for additional information  4.  Document specific behaviors observed  5.  Consider SAFE Team call for additional resources              The Broset Violence Checklist (BVC) is a 6 item inventory that was designed to assist in the prediction of imminent violent behavior (24 hours perspective) in healthcare and other sectors where workplace violence is acknowledged as a serious problem.   The BVC does not give instructions on what to do or what interventions to provide when a violent episode occurs; it is a took meant to aid in the risk assessment process. Interventions are based on the  individual, the environment, and cultural sensitivity indicators.

## 2020-10-16 LAB — BASIC METABOLIC PANEL
Anion Gap: 11 (ref 5.0–15.0)
BUN: 29 mg/dL — ABNORMAL HIGH (ref 9.0–28.0)
CO2: 21 mEq/L — ABNORMAL LOW (ref 22–29)
Calcium: 9.1 mg/dL (ref 8.5–10.5)
Chloride: 107 mEq/L (ref 100–111)
Creatinine: 1.1 mg/dL (ref 0.7–1.3)
Glucose: 109 mg/dL — ABNORMAL HIGH (ref 70–100)
Potassium: 3.6 mEq/L (ref 3.5–5.1)
Sodium: 139 mEq/L (ref 136–145)

## 2020-10-16 LAB — GFR: EGFR: >60

## 2020-10-16 NOTE — Plan of Care (Addendum)
Problem: At Risk for Harm to Self: AS EVIDENCED BY...  Goal: Will identify long and short term goals based on individual needs and strengths  Outcome: Progressing  Goal: Reduction of self-harm thoughts and no attempts  Outcome: Progressing  Goal: Identifies stressors, protective factors, and coping strategies  Outcome: Progressing  Goal: Verbalizes understanding of medication, benefits, and side effects  Outcome: Progressing       Ansh was sitting in his room and acting bizarre when he was received. He is A+O to person and place; and periodically confused. Hi mood is anxious, restless and affect is constricted. His speech is clear, soft and pleasant. Pt is cooperative and requires a lot of direction. He denies SI/HI/VH/SIB and pain but confirmed hearing voices and the voices are telling him " shooting is happening outside the hospital". He was very fearful about his life and hiding himself in the bathroom He was very fearful about his life and hiding himself in the bathroom. Emotional support and reassurance provided. Pt was given Haldol 5 mg for mood instability and Vistrail for anxiety . At the beginning of the shift patient was throwing water in his door and messing his room several times. He was redirected many times and educated on relaxing techniques. He is calm at the moment and resting without distress. No aggressive behavior noted and safety is maintained.

## 2020-10-16 NOTE — Progress Notes (Signed)
Checklist instructions: Score the patient once a shift and as needed. Absence of a behavior results in a score of 0. Presence or increase in the display of a behavior results in a score of 1. Maximum score (SUM) possible is 6. If a behavior is at baseline for a patient, e.g. the patient is normally confused, this will result in a score of 0. An increase in confusion will result in a score of 1.      Kristopher Rogers is being assessed under the Broset Violence Checklist for any escalations in potentially violent or harmful behavior.     Date of Admission:                      10/16/20                                                   Date of Assessment:    Assessment Time 2000      Confused 0      Irritable 0      Boisterous 0      Verbal Threats 0      Physical Threats 0      Attacking Objects 0      SUM 0          TW attempted the following interventions based on the patient's sum total score assessed on the BVC: 1= Monitoring.      Should the patient require multiple interventions or fails to engage in de-escalation with staff, please review the plan for managing the care of an escalated patient.  Please do the following:  1.  Immediately notify Security and Nursing Administrative Supervisor/Director to respond  2.  Conduct team huddle to rule out clinical causes for escalated behavior  3.  Review the note authored by the Associate Professor for additional information  4.  Document specific behaviors observed  5.  Consider SAFE Team call for additional resources      The Broset Violence Checklist (BVC) is a 6 item inventory that was designed to assist in the prediction of imminent violent behavior (24 hours perspective) in healthcare and other sectors where workplace violence is acknowledged as a serious problem.   The BVC does not give instructions on what to do or what interventions to provide when a violent episode occurs; it is a took meant to aid in the risk assessment process. Interventions are based on the  individual, the environment, and cultural sensitivity indicators.

## 2020-10-16 NOTE — Plan of Care (Signed)
Schizophrenia spectrum disorder with psychotic disorder type not yet determined 10/15/2020    AKI (acute kidney injury) 10/07/2020    Alcohol withdrawal syndrome with complication 09/30/2020          AKI (acute kidney injury) 10/07/2020    Suicidal behavior with attempted self-injury 10/03/2020    Bipolar I disorder, most recent episode manic, severe with psychotic features 10/03/2020    Alcohol withdrawal syndrome with complication 09/30/2020     60 year old male with no prior psychiatric history hypertension, essential tremors, admitted to medicine service 12/31 through 1/2 as TDO for paranoid and suicidal ideations, possible alcohol withdrawal, AKI, and transferred to inpatient psychiatry on 1/2. Medicine consulted on 1/6 for worsening renal function, with creatinine up to 1.7 from prior 1.1. Recently initiated on colchicine and Advil for possible gout flare versus hand trauma. Colchicine and NSAID placed on hold and creatinineimproved. Patient was found to be positive for COVID on 1/7 after being tested after potential exposure. Febrile on 1/8, likely in setting of COVID.Concern for acute gout flare of R second MCP joint and initiated on prednisone, colchicine on 1/12.    Tested + for Covid 19 on 1/7 w/o hypoxia or fevers. Vaccination status unknown.  Chart check    1/15: Vitals remain stable. Sat 99% RA. Blood work was ordered for this am but was not collected ( ? Refused?). Pt is again with psychotic and paranoid ideations and refused to take am meds per RN and also walked to hallway w/o mask.  Remains on Covid 19 isolation protocol since 1/7 and remains asymptomatic beyond fever on 1/8.       1/16: Chart check. Vitals stable. No hypoxia. No reported issues of pain/gout sxs. Ongoing confusion and psychosis. BMP today with Cr 1.1, bun 29.      Recommendations:   AKI, suspect related to NSAID, now resolved-> cr 0.7 now from peak of 1.7  SIRS criteria, suspect secondary to COVID,  resolved  COVID positive,no hypoxia or fevers ( he actually did have a 101 fever on 1/8) .  Hypertension  Acute psychosis-> possibly Schizophrenic.  Hx goutwith possible acute flare of 2nd R MCP   R hand trauma  Essential tremors on propranolol     Plan:   - Was resumed on colchicine 1/13 ( day 4/7)  and Prednisone 40 mg daily ( day 5/7) with reported improvement in sxs. Cont for now.. Cr ok 1.1   - Cont. Propranolol and Norvasc.   - Given Covid + monitor for hypoxia and fevers->so far none. He needs eventual Covid vaccination regardless of testing + since he is likely unvaccinated. The Prednisone may be masking some of the Covid sxs as well.   - Uric acid level normal   - Bp ok cont current   - Will follow.

## 2020-10-16 NOTE — Progress Notes (Signed)
Pt is sleeping in a bed with his eyes closed. He slept b/n 5-6 hrs and no distress noted. Safety is maintained.

## 2020-10-16 NOTE — Progress Notes (Signed)
Patient was observed walking on the hallway with only underwear on. Patient appear to be restless, confuse  and anxious.  Patient said that he is going down as someone is waiting for him. Patient was redirect to his room.  PRN  Haldol  5 mg was given for mood instability at 1436. Currently patient is requesting for discharge . Will continue to monitor for safety.

## 2020-10-16 NOTE — Progress Notes (Signed)
Psychiatry Progress Note  (Level 1 = Problem Focused, Level 2 = Expanded Problem Focused, Level 3 = Detailed)    Patient Name: Kristopher Rogers            Current Date/Time:  1/16/20225:58 PM  MRN:  [id number]                            Attending Physician: Claudean Severance, MD  DOB: 11-Jul-1961                                Gender: male                              I. History   Informants: Chart, Staff, Patient   A. Chief Complaint or Reason for Admission       Admitted for increased paranoia and SI  B.History of Present Illness: Interval History     (Symptoms and qualifiers:1 for Level 1, 2 for Level 2 and 3+ for Level 3)   Patient is a 60 y.o. male, in a relationship, unstable housing, unclear current employment,with PMHx of HTN, essential tremors, initially admitted from ED to medical unit on 09/30/2020 for alcohol withdrawal, AKI; on 10/02/2020, pt was admitted to psychiatry for a history of anxiety and alcohol use disorder that was admitted under a TDO in the setting of increased paranoia and SI over the last several weeks. TDO hearing on 10/03/2020; committed for up to 30 days. Tested + for sars-cov-2 on 10/07/2020.     This is writer's first encounter with this patient. Upon Office manager introduces herself and he states 'I only have a minute to talk." He states that he is doing "fine." He expresses that he is ready for discharge when the team is ready for him to go. He states that he is much better than he was when he first came to the hospital. He denies SI/HI/AVH at this time.     C.Medical History  Review of Systems  (Level 1 is none, Level 2 is 1, and Level 3 is 2+)  A complete 14 point ROS was done and was negative except for  Psychiatric: Psychosis  Constitutional: No complaints    D.Additional Past Psychiatric, Substance Use, Medical, Family and Social History  Psychiatric: No new information available as compared to previous encounter(s)  Substance Use: No new information available as compared to  previous encounter(s)  Medical: No new information available as compared to previous encounter(s)  Family: No new information available as compared to previous encounter(s)  Social: No new information available as compared to previous encounter(s)    Medications:   Current Medications  Report    Scheduled     Medication Ordered Dose/Rate, Route, Frequency Last Action    amLODIPine (NORVASC) tablet 10 mg 10 mg, PO, Daily Given, 10 mg at 01/16 0801    colchicine tablet 0.6 mg 0.6 mg, PO, Daily Given, 0.6 mg at 01/16 0801    folic acid (FOLVITE) tablet 1 mg 1 mg, PO, Daily Given, 1 mg at 01/16 0801    multivitamin tablet 1 tablet 1 tablet, PO, Daily Given, 1 tablet at 01/16 0801    OLANZapine (ZyPREXA) tablet 20 mg 20 mg, PO, QHS Given, 20 mg at 01/15 2104    predniSONE (DELTASONE) tablet 40 mg 40 mg, PO, QAM W/BREAKFAST  Given, 40 mg at 01/16 0800    propranolol (INDERAL) tablet 20 mg 20 mg, PO, Q8H SCH Given, 20 mg at 01/16 1431    thiamine (VITAMIN B1) tablet 100 mg 100 mg, PO, Daily Given, 100 mg at 01/16 0800          PRN     Medication Ordered Dose/Rate, Route, Frequency Last Action    acetaminophen (TYLENOL) suppository 650 mg (Or Linked Group #1) 650 mg, RE, Q6H PRN See Alternative, 01/14 2256    acetaminophen (TYLENOL) tablet 650 mg (Or Linked Group #1) 650 mg, PO, Q6H PRN Given, 650 mg at 01/08 2019    alum & mag hydroxide-simethicone (MAALOX PLUS) 200-200-20 mg/5 mL suspension 15 mL 15 mL, PO, Q4H PRN Given, 15 mL at 01/04 1703    benzocaine-menthol (CEPACOL) lozenge 1 lozenge 1 lozenge, BU, Q1H PRN Given, 1 lozenge at 01/14 2104    benzonatate (TESSALON) capsule 100 mg 100 mg, PO, TID PRN Ordered    dextromethorphan-guaiFENesin (MUCINEX DM) 30-600 MG per 12 hr tablet 1 tablet 1 tablet, PO, BID PRN Given, 1 tablet at 01/10 1113    guaiFENesin-codeine (ROBITUSSIN AC) 100-10 MG/5ML syrup 5 mL 5 mL, PO, Q6H PRN Given, 5 mL at 01/12 2301    haloperidol (HALDOL) tablet 5 mg 5 mg, PO, Q4H PRN Given, 5 mg at 01/16  1436    hydrALAZINE (APRESOLINE) injection 10 mg 10 mg, IV, Q6H PRN Ordered    hydrOXYzine (VISTARIL) capsule 25 mg 25 mg, PO, Q6H PRN Given, 25 mg at 01/14 0043    lidocaine (LIDODERM) 5 % 1 patch 1 patch, TD, Q24H PRN Ordered    ondansetron (ZOFRAN) tablet 4 mg 4 mg, PO, Q8H PRN Given, 4 mg at 01/04 1703                II. Examination   Vital signs reviewed:   Blood pressure (!) 124/91, pulse 90, temperature 97.3 F (36.3 C), temperature source Oral, resp. rate 18, height 1.803 m (5' 10.98"), weight 86.2 kg (190 lb 0.6 oz), SpO2 99 %.     Mental Status Exam  (Level 1 is 1-5, Level 2 is 6-8, Level 3 is 9+)  General appearance: Appears chronological age, Good hygiene and Good grooming  Attitude/Behavior: Calm and Cooperative  Motor: No abnormalities noted  Gait: No obvious abnormalities  Muscle strength and tone: Grossly intact  Speech:   Spontaneous: Yes  Rate and Rhythm: Normal  Volume: Normal  Tone: Normal  Clarity: Yes  Mood: " Fine"   Affect:   Range: Constricted  Stability: Stable  Thought Process:   Coherent: Yes  Logical:  Yes at times  Associations: Linear  Thought Content:   Delusions:  Not elicited  Depressive Cognitions:  None  Suicidal:  No suicidal thoughts  Homicidal:  No homicidal thoughts  Violent Thoughts:  No  Perceptions:   Dissociative Phenomena:  No  Hallucinations: No  Insight: Poor  Judgment: Poor  Cognition:   Level of Consciousness: Intact    Psychiatric / Cognitive Instruments: None    Pertinent Physical Exam: Not relevant to current chief complaint/reason for admission    Imaging / EKG / Labs:   Labs in the last 24 hours  Results     Procedure Component Value Units Date/Time    Basic Metabolic Panel [[id number]]  (Abnormal) Collected: 10/16/20 0920    Specimen: Blood Updated: 10/16/20 0948     Glucose 109 mg/dL      BUN 29.0  mg/dL      Creatinine 1.1 mg/dL      Calcium 9.1 mg/dL      Sodium 161 mEq/L      Potassium 3.6 mEq/L      Chloride 107 mEq/L      CO2 21 mEq/L      Anion Gap 11.0     GFR [[id number]] Collected: 10/16/20 0920     Updated: 10/16/20 0948     EGFR >60.0        EKG Results   Cardiology Results     Procedure Component Value Units Date/Time    ECG 12 lead [[id number]] Collected: 10/03/20 1418     Updated: 10/03/20 1442     Ventricular Rate 91 BPM      Atrial Rate 91 BPM      P-R Interval 158 ms      QRS Duration 68 ms      Q-T Interval 388 ms      QTC Calculation (Bezet) 477 ms      P Axis 55 degrees      R Axis -23 degrees      T Axis 41 degrees     Narrative:      NORMAL SINUS RHYTHM  NORMAL ECG  Confirmed by KOPIN, DAVID ([id number]) on 10/03/2020 2:42:48 PM          Brain Scan   Results for orders placed or performed during the hospital encounter of 10/02/20   CT Head WO Contrast    Narrative    HISTORY:    COMPARISON: None.    TECHNIQUE: CT of the head without intravenous contrast. The following  dose reduction techniques were utilized: Automated exposure control  and/or adjustment of the mA and/or kV according to patient size, and the  use of iterative reconstruction technique.    FINDINGS: There is mild bilateral cerebral and cerebellar volume loss.      There is mild subcortical, deep, and periventricular leukomalacia in  both cerebral hemispheres. No parenchymal mass, acute  intracranial  hemorrhage, or hydrocephalus is detected.  No extra-axial (subdural or  epidural) collection is seen.    The basilar cisterns are clear.  There  is calcified atherosclerotic change of the major arterial vessels.    The calvarium and skull base are intact. No skull base fracture or  calvarial fracture is seen.   The soft tissues are unremarkable. The  paranasal sinuses show no significant mucosal thickening.        Impression    1. No acute intracranial hemorrhage is detected.   2. There is mild subcortical, deep, and periventricular leukomalacia in  both cerebral hemispheres.  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     Judd Gaudier, MD   10/06/2020 2:32 AM       III. Assessment and  Plan (Medical Decision Making)     1. I certify that this patient continues to require inpatient hospitalization due to acute risk to self and unable to care for self in the community with insufficient support available    2. Psychiatric Diagnoses  Provisional: F20.9 Schizophrenia, multiple episodes, currently in acute episode    F10.20 Alcohol use disorder, severe, in a controlled environment  F10.232 Alcohol withdrawal, with perceptual disturbance, resolved    Rule out F10.26 Alcohol induced major neurocognitive disorder, nonamnestic-confabulatory type      Medical concerns:   Essential Tremors   Hypertension   Gout   R hand trauma   AKI, suspect  related to NSAID, now resolved   Covid-19 Positive (on 10/07/2020)    3.All diagnostic procedures completed since admission were reviewed. Past medical records reviewed. Coordination of care was discussed with inpatient team and as available with the outpatient team.    4. On this admission patient educated about and provided input into their treatment plan.  Patient understands potential risks and benefits of proposed treatment plan.     5.  Assessment / Impression  Patient is a 60 y.o. male, in a relationship, residing in a hotel for the past 4 years, unclear current employment,with PMHx of HTN, essential tremors, initially admitted from ED to medical unit on 09/30/2020 for alcohol withdrawal, AKI; on 10/02/2020, pt was admitted to psychiatry for a history of anxiety and alcohol use disorder that was admitted under a TDO in the setting of increased paranoia and SI over the last several weeks. TDO hearing on 10/03/2020; committed for up to 30 days. Tested + for sars-cov-2 on 10/07/2020.     Mr. Hoglund presents with superficial answers. He presents answers in such a way that he appears very logical and coherent. However, less than 20 minutes after encounter he was attempting to elope off the unit. He received PRN medication to help with his agitation and confusion.  Would consider speaking with IM about prednisone and consideration for decreasing as it may be contributing to his current presentation.     Suicide Risk Assessment   Suicide Risk Assessment performed? Yes: Suicide Thoughts / Behaviors: None    Plan / Recommendations:    Biological Plan:  Medications:   Current Facility-Administered Medications   Medication Dose Route Frequency    amLODIPine  10 mg Oral Daily    colchicine  0.6 mg Oral Daily    folic acid  1 mg Oral Daily    multivitamin  1 tablet Oral Daily    OLANZapine  20 mg Oral QHS    predniSONE  40 mg Oral QAM W/BREAKFAST    propranolol  20 mg Oral Q8H SCH    thiamine  100 mg Oral Daily     Medical Work-up: None required at this time  Consults: None required at this time    Psychosocial Plan:  Individual therapy: Psycho-education and psychotherapy of the following modalities will continue to be provided during daily visits:Supportive  Group and milieu therapies: daily per unit's schedule.  Continue with Social Work intervention provided for discharge planning including assistance with follow-up psychiatric care.    Plan for Family Involvement: Family involved in care planning  Other Providers Contact Information and Dates Contacted: No Updates    Total Attending time spent 25 minutes (floor time) with more than 50 percent of time in direct patient contact, coordinating care and counseling.    Signed by: Rella Larve, NP  10/16/2020  5:58 PM

## 2020-10-16 NOTE — Progress Notes (Signed)
Checklist instructions: Score the patient once a shift and as needed. Absence of a behavior results in a score of 0. Presence or increase in the display of a behavior results in a score of 1. Maximum score (SUM) possible is 6. If a behavior is at baseline for a patient, e.g. the patient is normally confused, this will result in a score of 0. An increase in confusion will result in a score of 1.      Kristopher Rogers is being assessed under the Broset Violence Checklist for any escalations in potentially violent or harmful behavior.     Date of Admission:  10/02/2020       Date of Assessment: 10/16/20   Assessment Time 0800      Confused 0      Irritable 0      Boisterous 0      Verbal Threats 0      Physical Threats 0      Attacking Objects 0      SUM 0          TW attempted the following interventions based on the patient's sum total score assessed on the BVC: 1= Monitoring.      Should the patient require multiple interventions or fails to engage in de-escalation with staff, please review the plan for managing the care of an escalated patient.  Please do the following:  1.  Immediately notify Security and Nursing Administrative Supervisor/Director to respond  2.  Conduct team huddle to rule out clinical causes for escalated behavior  3.  Review the note authored by the Associate Professor for additional information  4.  Document specific behaviors observed  5.  Consider SAFE Team call for additional resources              The Broset Violence Checklist (BVC) is a 6 item inventory that was designed to assist in the prediction of imminent violent behavior (24 hours perspective) in healthcare and other sectors where workplace violence is acknowledged as a serious problem.   The BVC does not give instructions on what to do or what interventions to provide when a violent episode occurs; it is a took meant to aid in the risk assessment process. Interventions are based on the individual, the environment, and cultural  sensitivity indicators.

## 2020-10-16 NOTE — Plan of Care (Signed)
Problem: At Risk for Harm to Self: AS EVIDENCED BY...  Goal: Will identify long and short term goals based on individual needs and strengths  Outcome: Progressing     Problem: At Risk for Harm to Self: AS EVIDENCED BY...  Goal: Reduction of self-harm thoughts and no attempts  Outcome: Progressing     Problem: At Risk for Harm to Self: AS EVIDENCED BY...  Goal: Verbalizes understanding of medication, benefits, and side effects  Outcome: Progressing     Problem: At Risk for Harm to Self: AS EVIDENCED BY...  Goal: Identify and participate in supportive program therapies  Outcome: Progressing     Patient was visible in the hallway on the start of the shift. He was alert and oriented X 2, disoriented to time and situation.  He was cooperative with care. Intermittently confused. Patient was redirectable. Able to make his need known. He was seen responding to internal stimuli. He was seen talking to himself in his room. He appear to be restless and anxious .  Patient accepted his morning schedule medication. Vital signs stable. No signs of respiratory distress was noted. Patient denies shortness of breath or cough . Patient is afebrile. Will continue to monitor for safety.

## 2020-10-17 MED ORDER — PREDNISONE 5 MG PO TABS
20.0000 mg | ORAL_TABLET | Freq: Every morning | ORAL | Status: AC
Start: 2020-10-18 — End: 2020-10-19
  Administered 2020-10-18 – 2020-10-19 (×2): 20 mg via ORAL
  Filled 2020-10-17 (×2): qty 4

## 2020-10-17 NOTE — Progress Notes (Signed)
RT staff greeted patient and provided patient word searches related to science and a anxiety coping strategy worksheet "What could happen versus what will happen". Patient thanked RT staff. RT staff will continue to offer patient support and leisure activities for healthy leisure engagement.    Phylliss Bob MS, LRT/CTRS

## 2020-10-17 NOTE — Progress Notes (Signed)
Checklist instructions: Score the patient once a shift and as needed. Absence of a behavior results in a score of 0. Presence or increase in the display of a behavior results in a score of 1. Maximum score (SUM) possible is 6. If a behavior is at baseline for a patient, e.g. the patient is normally confused, this will result in a score of 0. An increase in confusion will result in a score of 1.      Kristopher Rogers is being assessed under the Broset Violence Checklist for any escalations in potentially violent or harmful behavior.     Date of Admission: 10/02/20                                                          Date of Assessment: 10/17/20   Assessment Time 2000      Confused 0      Irritable 0      Boisterous 0      Verbal Threats 0      Physical Threats 0      Attacking Objects 0      SUM 0          TW attempted the following interventions based on the patient's sum total score assessed on the BVC: 1= Monitoring.      Should the patient require multiple interventions or fails to engage in de-escalation with staff, please review the plan for managing the care of an escalated patient.  Please do the following:  1.  Immediately notify Security and Nursing Administrative Supervisor/Director to respond  2.  Conduct team huddle to rule out clinical causes for escalated behavior  3.  Review the note authored by the Associate Professor for additional information  4.  Document specific behaviors observed  5.  Consider SAFE Team call for additional resources              The Broset Violence Checklist (BVC) is a 6 item inventory that was designed to assist in the prediction of imminent violent behavior (24 hours perspective) in healthcare and other sectors where workplace violence is acknowledged as a serious problem.   The BVC does not give instructions on what to do or what interventions to provide when a violent episode occurs; it is a took meant to aid in the risk assessment process. Interventions are based on the  individual, the environment, and cultural sensitivity indicators.

## 2020-10-17 NOTE — Progress Notes (Signed)
MEDICINE PROGRESS NOTE    Date Time: 10/17/20 2:00 PM  Patient Name: Kristopher Rogers  Attending Physician: Claudean Severance, MD    Assessment:     60 year old male with no prior psychiatric history hypertension, essential tremors, admitted to medicine service 12/31 through 1/2 as TDO for paranoid and suicidal ideations, possible alcohol withdrawal, AKI, and transferred to inpatient psychiatry on 1/2. Medicine consulted on 1/6 for worsening renal function, with creatinine up to 1.7 from prior 1.1. Recently initiated on colchicine and Advil for possible gout flare versus hand trauma. Colchicine and NSAID placed on hold and creatinineimproved. Patient was found to be positive for COVID on 1/7 after being tested after potential exposure. Febrile on 1/8, likely in setting of COVID.Concern for acute gout flare of R second MCP joint and initiated on prednisone, colchicine on 1/12.    Tested + for Covid 19 on 1/7 w/o hypoxia or fevers. Vaccination status unknown.  Chart check    1/15: Vitals remain stable. Sat 99% RA. Blood work was ordered for this am but was not collected ( ? Refused?). Pt is again with psychotic and paranoid ideations and refused to take am meds per RN and also walked to hallway w/o mask. Remains on Covid 19 isolation protocol since 1/7 and remains asymptomatic beyond fever on 1/8.     1/16: Chart check. Vitals stable. No hypoxia. No reported issues of pain/gout sxs. Ongoing confusion and psychosis. BMP today with Cr 1.1, bun 29.      1/17: Pt seen per NP request to possibly lower the Prednisone.  Pt is feeling better with resolved pain in the R ankle/toe and much improved pain and swelling R 2.nd MCP joint.  He is now able to make a fist which a big improvement.  He remains asymptomatic from Covid. He feels that he is better from psych stand point and is asking if he can go home. Defer to psych.     A/P  Acute Gout flare R 2nd MCP and R ankle/great toe.   AKI, suspect related to NSAID,  now resolved-> cr 0.7 now from peak of 1.7  SIRS criteria, suspect secondary to COVID, resolved  COVID positive,no hypoxia or fevers ( he actually did have a 101 fever on 1/8) .  Hypertension  Acute psychosis-> possibly Schizophrenic.  Hx goutwith possible acute flare of 2nd R MCP   R hand trauma  Essential tremors on propranolol     Plan:   - Doing much better overall with now only mild swelling and erythema of the MCP joint. Can taper prednisone to 20 mg for 2 more days and then [place] ( ordered)   - Complete the 7 days of Colchicine ( 2 more days left)    - He states that he rarely has attacks and when he does he takes she colchicine which he feels works the best. Indomethacin did not work well for him. He is not interested in starting any ppx tx.   - Cont. Propranolol and Norvasc.   - Given Covid + monitor for hypoxia and fevers->so far none. He needs eventual Covid vaccination regardless of testing + since he is likely unvaccinated. The Prednisone may be masking some of the Covid sxs as well.   - Uric acid level normal   - Bpok cont current   - Will follow.       Physical Exam:     VITAL SIGNS PHYSICAL EXAM   Temp:  [97.3 F (36.3 C)-97.9 F (36.6 C)]  97.9 F (36.6 C)  Heart Rate:  [57-109] 75  Resp Rate:  [16-18] 18  BP: (124-156)/(80-98) 134/80  Blood Glucose:    No intake or output data in the 24 hours ending 10/17/20 1400 Physical Exam  General: awake, alert X nad  Cardiovascular: regular rate and rhythm, no murmurs, rubs or gallops  Lungs: clear to auscultation bilaterally, without wheezing, rhonchi, or rales  Abdomen: soft, non-tender, non-distended; no palpable masses,  normoactive bowel sounds  Extremities: no edema  Other: mild erythema of 2nd mcp joint. Normal rom. No obvious path of R foot/anke/toes.        Meds:     Medications were reviewed:  Current Facility-Administered Medications   Medication Dose Route Frequency    amLODIPine  10 mg Oral Daily    colchicine  0.6 mg Oral Daily     folic acid  1 mg Oral Daily    multivitamin  1 tablet Oral Daily    OLANZapine  20 mg Oral QHS    [START ON 10/18/2020] predniSONE  20 mg Oral QAM W/BREAKFAST    propranolol  20 mg Oral Q8H SCH    thiamine  100 mg Oral Daily     Current Facility-Administered Medications   Medication Dose Route Frequency Last Rate     Current Facility-Administered Medications   Medication Dose Route    acetaminophen  650 mg Oral    Or    acetaminophen  650 mg Rectal    alum & mag hydroxide-simethicone  15 mL Oral    benzocaine-menthol  1 lozenge Buccal    benzonatate  100 mg Oral    dextromethorphan-guaiFENesin  1 tablet Oral    guaiFENesin-codeine  5 mL Oral    haloperidol  5 mg Oral    hydrALAZINE  10 mg Intravenous    hydrOXYzine  25 mg Oral    lidocaine  1 patch Transdermal    ondansetron  4 mg Oral         Labs:     Labs (last 72 hours):             Recent Labs   Lab 10/16/20  0920   Sodium 139   Potassium 3.6   Chloride 107   CO2 21*   BUN 29.0*   Creatinine 1.1   Calcium 9.1   Glucose 109*                   Microbiology, reviewed and are significant for:  Microbiology Results (last 15 days)     Procedure Component Value Units Date/Time    COVID-19 (SARS-CoV-2) and Influenza A/B & RSV (Cepheid)- Age less than 5 [[id number]]  (Abnormal) Collected: 10/07/20 2107    Order Status: Completed Specimen: Nasopharyngeal Swab Updated: 10/08/20 0034     Purpose of COVID testing Diagnostic -PUI     SARS-CoV-2 Specimen Source Nasal Swab     SARS CoV 2 Overall Result Positive     Comment: COVID results called to [facility] #[id number].  Readback confirmed by [id number] at 00:10, 10/08/2020.  Test performed using the Cepheid Xpert Xpress SARS-CoV-2 assay.  This assay is only for use under the Food and Drug  Administration's Emergency Use Authorization. This is a  real-time RT-PCR assay for the qualitative detection of  SARS-CoV-2 (COVID-19) RNA. Viral nucleic acids may persist in  vivo, independent of viability. Detection of viral nucleic  acid  does not imply the presence of infectious virus, or that virus  nucleic acid is the  cause of clinical symptoms.  Test performance has not been established for immunocompromised  patients or patients without signs and symptoms of respiratory  infection. Negative results do not preclude SARS-CoV-2infection  and should not be used as the sole basis for patient  management decisions.  Invalid results may be due to inhibiting  substances in the specimen and recollection should occur.    Please see Fact Sheets for patients and providers located at:  http://www.rice.biz/          Influenza A Negative     Influenza B Negative     Respiratory Syncytial Virus Negative     Comment: Test performed using the Cepheid assay.  Please see Fact Sheets for patients and providers located at:  http://www.rice.biz/    Test performed using the Cepheid Xpert Xpress SARS-CoV-2/Flu/RSV  assay. This assay is only for use under the Food and Drug  Administrations Emergency Use Authorization.  This is a multiplexed  real-time RT-PCR test intended for the simultaneous qualitative  detection and differentiation of SARS-CoV-2, influenza A, influenza B,  and respiratory syncytial virus (RSV) viral RNA.  Viral nucleic acids  may persist in vivo, independent of viability. Detection of viral  nucleic acid does not imply the presence of infectious virus, or that  virus nucleic acid is the cause of clinical symptoms. If only one viral  target is positive but coinfection with multiple targets is suspected,  the sample should be re-tested with another FDA cleared, approved, or  authorized test, if coinfection would change clinical management.  Negative results do not preclude SARS-CoV-2, influenza A virus,  influenza B virus and/or RSV infection and should not be used as the  sole basis for treatment or other patient management decisions.  Invalid results may be due to inhibiting substances in the specimen  and  recollection should occur.         Narrative:      o Collect and clearly label specimen type:  o PREFERRED-Upper respiratory specimen: One Nasopharyngeal Swab in  Transport Media.  o Hand deliver to laboratory ASAP  Diagnostic -[facility]I    COVID-19 (SARS-CoV-2) and Influenza A/B & RSV (Cepheid)- Age less than 5 [[id number]]     Order Status: Canceled Specimen: Nasopharyngeal Swab     COVID-19 (SARS-CoV-2) and Influenza A/B & RSV (Cepheid)- Age less than 5 [[id number]]     Order Status: Canceled Specimen: Nasopharyngeal Swab     COVID-19 (SARS-CoV-2) and Influenza A/B & RSV (Cepheid)- Age less than 5 [[id number]]     Order Status: Canceled Specimen: Nasopharyngeal Swab     COVID-19 (SARS-CoV-2) and Influenza A/B, NAA ([facility])- Admission [[id number]]     Order Status: Canceled Specimen: Culturette from Nasopharyngeal             Signed by: Tamela Oddi, MD

## 2020-10-17 NOTE — Plan of Care (Signed)
Problem: At Risk for Harm to Self: AS EVIDENCED BY...  Goal: Will identify long and short term goals based on individual needs and strengths  Outcome: Progressing     Problem: At Risk for Harm to Self: AS EVIDENCED BY...  Goal: Reduction of self-harm thoughts and no attempts  Outcome: Progressing     Problem: Thought Disorder  Goal: Demonstrates orientation to person place and time  Outcome: Progressing     Problem: Thought Disorder  Goal: Verbalizes reduction in hallucinations/delusions  Outcome: Progressing     Problem: Safety  Goal: Patient will be free from injury during hospitalization  Outcome: Progressing     Problem: At Risk for Harm to Self: AS EVIDENCED BY...  Goal: Verbalizes understanding of medication, benefits, and side effects  Outcome: Progressing   Kristopher Rogers was seen sitting up in the chair in his room eating breakfast. Patient was calm and cooperative upon approach. He is alert and oriented x3. Speech is clear with normal volume and tone. Patient reports mood is "ok" and reported sleeping well last night. Denies SI/HI/AVH. Patient denies feeling depressed; however, he reported feeling slightly anxious because he doesn't know when he will leave the hospital. Compliant with scheduled medications. Patient reports mild cough but denies other COVID symptoms. Denies fever, chills, HA, shortness of breath, loss of taste and smell. Vital signs are stable. Reports good appetite. Patient is able to make his needs known. No acute distress noted. Q15 mins safety rounds maintained. Will continue to monitor.

## 2020-10-17 NOTE — Progress Notes (Signed)
Psychiatry Progress Note  (Level 1 = Problem Focused, Level 2 = Expanded Problem Focused, Level 3 = Detailed)    Patient Name: Kristopher Rogers              Current Date/Time:  1/17/202210:20 AM  MRN:  [id number]                              Attending Physician: Claudean Severance, MD  DOB: 1961/07/23                                Gender: male                              I. History   Informants: patient, chart-review,clinical staff  A. Chief Complaint or Reason for Admission       CC "I am okay"    B.History of Present Illness: Interval History     (Symptoms and qualifiers:1 for Level 1, 2 for Level 2 and 3+ for Level 3)  Patient is a 60 y.o. male, in a relationship, unstable housing, unclear current employment,  with PMHx of HTN, essential tremors, initially admitted from ED to medical unit on 09/30/2020 for alcohol withdrawal, AKI; on 10/02/2020, pt was admitted to psychiatry for a history of anxiety and alcohol use disorder that was admitted under a TDO in the setting of increased paranoia and SI over the last several weeks. TDO hearing on 10/03/2020; committed for up to 30 days. Tested + for sars-cov-2 on 10/07/2020.     MAR: compliant with scheduled meds; haldol 5mg  po x2 doses & vistaril 25mg  po x1 given in the past 24 hours  Per nightshift RN noted:, He denies SI/HI/VH/SIB and pain but confirmed hearing voices and the voices are telling him " shooting is happening outside the hospital". He was very fearful about his life and hiding himself in the bathroom He was very fearful about his life and hiding himself in the bathroom. Slept 7 hours       Today, patient was seen in his room.   Pt was found talking to himself; pt reported it was his way of praying and meditating to God in the context of ongoing isolation to a room. Pt reported he can't wait to be out and about. Pt was informed his 10 days of quarantine ends today, and we plan to transfer him to adjacent psych units when beds are available.    Patient is  oriented to DOB, hospital name, psych unit, month/year. Patient reported he remembers seeking help for panic attack, and the police had brought him for psych evaluation. Pt reported he remembers feeling surprised when he had to participate in his own TDO hearing ; pt was informed reasons leading to that per original team notes.  Pt was informed of the logistics of the TDO hearing, and how long he may stay in the unit.   Pt denied feeling confused or paranoid at this time. Denied severe anxiety or mood lability. Denied hallucinations. Denied SI, or death wish.    Patient agreed to call his daughter Kristopher Rogers today; pt was agreeable.  Pt was informed he may get moved to another team when he transitions out of covid unit.     Spoke with Internal Medicine, Dr Cleta Alberts, who had been following patient for the past  few days. Discussed patient's severe mood lability and anxiety noted on 1/15-16 and if that might be related to increase in prednisone; IM team plans to gradually taper of prednisone.     C.Medical History  Review of Systems  (Level 1 is none, Level 2 is 1, and Level 3 is 2+)  Psychiatric: denied psychosis, hallucinations. Pt is likely responding to internal stimuli  Constitutional: No complaints    D.Additional Past Psychiatric, Substance Use, Medical, Family and Social History  Psychiatric: No new information available as compared to previous encounter(s)  Substance Use: No new information available as compared to previous encounter(s)  Medical: No new information available as compared to previous encounter(s)  Family: No new information available as compared to previous encounter(s)  Social: No new information available as compared to previous encounter(s)    Medications:   Current Medications  Report    Scheduled     Medication Ordered Dose/Rate, Route, Frequency Last Action    amLODIPine (NORVASC) tablet 10 mg 10 mg, PO, Daily Given, 10 mg at 01/17 0816    colchicine tablet 0.6 mg 0.6 mg, PO, Daily Given,  0.6 mg at 01/17 0816    folic acid (FOLVITE) tablet 1 mg 1 mg, PO, Daily Given, 1 mg at 01/17 0817    multivitamin tablet 1 tablet 1 tablet, PO, Daily Given, 1 tablet at 01/17 0816    OLANZapine (ZyPREXA) tablet 20 mg 20 mg, PO, QHS Given, 20 mg at 01/16 2101    predniSONE (DELTASONE) tablet 40 mg 40 mg, PO, QAM W/BREAKFAST Given, 40 mg at 01/17 0816    propranolol (INDERAL) tablet 20 mg 20 mg, PO, Q8H SCH Given, 20 mg at 01/16 2101    thiamine (VITAMIN B1) tablet 100 mg 100 mg, PO, Daily Given, 100 mg at 01/17 0816          PRN     Medication Ordered Dose/Rate, Route, Frequency Last Action    acetaminophen (TYLENOL) suppository 650 mg (Or Linked Group #1) 650 mg, RE, Q6H PRN See Alternative, 01/14 2256    acetaminophen (TYLENOL) tablet 650 mg (Or Linked Group #1) 650 mg, PO, Q6H PRN Given, 650 mg at 01/08 2019    alum & mag hydroxide-simethicone (MAALOX PLUS) 200-200-20 mg/5 mL suspension 15 mL 15 mL, PO, Q4H PRN Given, 15 mL at 01/04 1703    benzocaine-menthol (CEPACOL) lozenge 1 lozenge 1 lozenge, BU, Q1H PRN Given, 1 lozenge at 01/14 2104    benzonatate (TESSALON) capsule 100 mg 100 mg, PO, TID PRN Ordered    dextromethorphan-guaiFENesin (MUCINEX DM) 30-600 MG per 12 hr tablet 1 tablet 1 tablet, PO, BID PRN Given, 1 tablet at 01/10 1113    guaiFENesin-codeine (ROBITUSSIN AC) 100-10 MG/5ML syrup 5 mL 5 mL, PO, Q6H PRN Given, 5 mL at 01/12 2301    haloperidol (HALDOL) tablet 5 mg 5 mg, PO, Q4H PRN Given, 5 mg at 01/16 2100    hydrALAZINE (APRESOLINE) injection 10 mg 10 mg, IV, Q6H PRN Ordered    hydrOXYzine (VISTARIL) capsule 25 mg 25 mg, PO, Q6H PRN Given, 25 mg at 01/16 2101    lidocaine (LIDODERM) 5 % 1 patch 1 patch, TD, Q24H PRN Ordered    ondansetron (ZOFRAN) tablet 4 mg 4 mg, PO, Q8H PRN Given, 4 mg at 01/04 1703                II. Examination   Vital signs reviewed:   Blood pressure 134/80, pulse 75, temperature 97.9 F (  36.6 C), temperature source Oral, resp. rate 18, height 1.803 m (5' 10.98"), weight  86.2 kg (190 lb 0.6 oz), SpO2 96 %.     Mental Status Exam  (Level 1 is 1-5, Level 2 is 6-8, Level 3 is 9+)  General appearance: Appears chronological age and fair hygiene; wearing pt attire gown, resting on bed, fully awake  Attitude/Behavior: Calm, Cooperative and Eye Contact is  Good   Motor: No abnormalities noted  Gait: No obvious abnormalities  Muscle strength and tone: Not tested  Speech:   Spontaneous: Yes  Rate and Rhythm: Normal  Volume: Normal  Tone: Normal  Clarity: Yes  Mood: "okay"  Affect:   Range: Blunt  Thought Process:   Coherent: Yes  Logical:  Yes at times  Associations: Circumstantial  Thought Content:   Delusions:  Yes  Depressive Cognitions:  Helpless  Suicidal:  No suicidal thoughts  Homicidal:  No homicidal thoughts  Violent Thoughts:  No  Perceptions:   Hallucinations: No  Insight: Poor  Judgment: Poor  Cognition:   Level of Consciousness: Intact  Orientation: Intact to self, place and time    Psychiatric / Cognitive Instruments: None    Pertinent Physical Exam: Not relevant to current chief complaint/reason for admission    Imaging / EKG / Labs:   Labs in the last 24 hours  Results     ** No results found for the last 24 hours. **          III. Assessment and Plan (Medical Decision Making)     1. I certify that this patient continues to require inpatient hospitalization due to unable to care for self in the community with insufficient support available    2. Psychiatric Diagnoses:    Provisional: F20.9 Schizophrenia, multiple episodes, currently in acute episode    F10.20 Alcohol use disorder, severe, in a controlled environment  F10.232 Alcohol withdrawal, with perceptual disturbance, resolved    Rule out F10.26 Alcohol induced major neurocognitive disorder, nonamnestic-confabulatory type      Medical concerns:   Essential Tremors   Hypertension   Gout   R hand trauma   AKI, suspect related to NSAID, now resolved   Covid-19 (tested + on 10/07/2020)--now recovered      3.All  diagnostic procedures completed since admission were reviewed. Past medical records reviewed. Coordination of care was discussed with inpatient team and as available with the outpatient team.    4. On this admission patient educated about and provided input into their treatment plan.  Patient understands potential risks and benefits of proposed treatment plan.     5.  Assessment / Impression  Patient is a 60 y.o. male, in a relationship, residing in a hotel for the past 4 years, unclear current employment,  with PMHx of HTN, essential tremors, initially admitted from ED to medical unit on 09/30/2020 for alcohol withdrawal, AKI; on 10/02/2020, pt was admitted to psychiatry for a history of anxiety and alcohol use disorder that was admitted under a TDO in the setting of increased paranoia and SI over the last several weeks. TDO hearing on 10/03/2020; committed for up to 30 days. Tested + for sars-cov-2 on 10/07/2020.     Suicide Risk Assessment  - denied SI    1/14, by attending: Stable mood with good focus and attention and no overt paranoia or evidence of psychosis; however, continues to make odd references suggestive of chronic underlying thought disorder.  He appears to be experiencing anxiety likely secondary to the  prednisone for his gout.  Plan to increase olanzapine to 20 mg at at bedtime and obtain collateral information from his daughter (which he has provided consent).  Anticipate likely will require transfer to 6 S. or 6 [address]. when cleared from COVID isolation prior to disposition from the hospital.  Patient provides consent to speak with his daughter living in [place].    1/17: Slept 7 hours. Vitals are unremarkable this morning.  had required oral PRN of haldol x2, and vistaril x2 due to anxiety and psychosis. Does not appear to be confused today, however, patient may not be aware of his clinical manifestations secondary to his confusion. His mood lability and psychosis might also have increased due to  scheduled prednisone; Per discussion with IM team today, prednisone is expected to be tapered off. Olanzapine at 20mg  qhs. Pt continues to require inpatient hospitalization for treatment of his psychosis, and until appropriate discharge plan is coordinated .  Plan to transfer to 6S if bed opens up on 1/17 s/p completion of quarantine.       Plan / Recommendations:  Biological Plan:  Medications:   Current Facility-Administered Medications   Medication Dose Route Frequency   . amLODIPine  10 mg Oral Daily   . colchicine  0.6 mg Oral Daily   . folic acid  1 mg Oral Daily   . multivitamin  1 tablet Oral Daily   . OLANZapine  20 mg Oral QHS   . predniSONE  40 mg Oral QAM W/BREAKFAST   . propranolol  20 mg Oral Q8H SCH   . thiamine  100 mg Oral Daily       Medical Work-up: None required at this time     Consults: Internal Medicine note, appreciated.     sychosocial Plan:  Individual therapy: Psycho-education and psychotherapy of the following modalities will continue to be provided during daily visits:Supportive     Group and milieu therapies: daily per unit's schedule.    Continue with Social Work intervention provided for discharge planning including assistance with placement, case management referral and follow-up psychiatric care.    Plan for Family Involvement: updated daughter, most recent on 1/15     Other Providers Contact Information and Dates Contacted: No Updates    Total provider time spent 25 minutes (floor time) with more than 50 percent of time in direct patient contact, coordinating care and counseling.    Signed by: Cecille Rubin, NP  10/17/2020  12:43 PM

## 2020-10-17 NOTE — Progress Notes (Signed)
Daily Note:    Affect/Mood:  Appropriate    Thought Process:  Perseverative    Thought Content:  Within Normal Limits    Interpersonal:  Isolative    Treatment Plan Goal Addressed:     Learn positive coping skills and express feelings about hospitalization.    Intervention Used and Patient Response:     Patient is on isolation for COVID, and not attending groups yet. This Therapist called and spoke with patient via the intercom today. Pt was calm and pleasant. Pt reported that, I am fine and dont need anything. Therapist encouraged Pt to communicate with the staff also the therapist; however, Pt did not talk about any specific topic. Therapist offered some reading materials and patient stated that, I am fine and dont need them now. Therapist offered word searches and art supplies and patient stated, okay. Therapist provided some word searches also art supplies. Therapist will continue checking and providing support as needed.

## 2020-10-17 NOTE — Progress Notes (Signed)
Kristopher Rogers is sleeping in a bed with his eyes closed. He slept for 7 hours and no distress noted. Safety is maintained.

## 2020-10-17 NOTE — Progress Notes (Signed)
Checklist instructions: Score the patient once a shift and as needed. Absence of a behavior results in a score of 0. Presence or increase in the display of a behavior results in a score of 1. Maximum score (SUM) possible is 6. If a behavior is at baseline for a patient, e.g. the patient is normally confused, this will result in a score of 0. An increase in confusion will result in a score of 1.      Kristopher Rogers is being assessed under the [place] Violence Checklist for any escalations in potentially violent or harmful behavior.     Date of Admission:  10/02/2020   Date of Assessment: 10/17/2020   Assessment Time 0      Confused 0      Irritable 0      Boisterous 0      Verbal Threats 0      Physical Threats 0      Attacking Objects 0      SUM 0          TW attempted the following interventions based on the patient's sum total score assessed on the BVC: 1= Monitoring.      Should the patient require multiple interventions or fails to engage in de-escalation with staff, please review the plan for managing the care of an escalated patient.  Please do the following:  1.  Immediately notify Security and Nursing Administrative Supervisor/Director to respond  2.  Conduct team huddle to rule out clinical causes for escalated behavior  3.  Review the note authored by the Associate Professor for additional information  4.  Document specific behaviors observed  5.  Consider SAFE Team call for additional resources        The Broset Violence Checklist (BVC) is a 6 item inventory that was designed to assist in the prediction of imminent violent behavior (24 hours perspective) in healthcare and other sectors where workplace violence is acknowledged as a serious problem.   The BVC does not give instructions on what to do or what interventions to provide when a violent episode occurs; it is a took meant to aid in the risk assessment process. Interventions are based on the individual, the environment, and cultural sensitivity  indicators.

## 2020-10-18 NOTE — UM Notes (Signed)
10/18/2020 3:31 pm Continued stay review for admission date of 10/02/2020    Legal Status: TDO hearing on 10/03/2020; committed for up to 30 days. Tested + for sars-cov-2 on 10/07/2020.     10/18/2020 per MD notes Dr. Delman Cheadle; Seen this morning in his darkened room, lights on, shades closed.  He reports that he is doing "well" but admits to some increased anxiety and restlessness since initiating the prednisone.  He goes on to state that he has had no other issues; however, upon further discussion he admits that he was "called by the [employer]" to report for "a drill" downstairs and he subsequently tried to elope in order to participate over the weekend.  When asked if they were auditory hallucinations he says, "no they were real, like over the loud speaker."  He denies thoughts of self-harm, suicide, or aggression.  He has not yet spoken with his family.  He reports no issues with sleep and maintains a good appetite.    MAR: Medication compliant.  No as needed's.    Examination   Vital signs reviewed:   Blood pressure (!) 171/105, pulse 74, temperature 97.9 F (36.6 C), temperature source Oral, resp. rate 12, height 1.803 m (5' 10.98"), weight 86.2 kg (190 lb 0.6 oz), SpO2 100 %.     Mental Status Exam  (Level 1 is 1-5, Level 2 is 6-8, Level 3 is 9+)  General appearance: Dressed casually in hospital attire, disheveled.  Reasonable hygiene.  Comfortable.   Attitude/Behavior: Pleasant, appropriate, with reasonable eye contact.  Engaging.  Slightly befuddled/confused.  Motor: Essential tremor bilaterally  Gait: Ambulates without impairment.  Muscle strength and tone: Grossly intact  Speech:   Spontaneous: Yes  Rate and Rhythm: Normal  Volume: Normal  Tone: Monotonous  Clarity: Yes  Mood: Euthymic  Affect:  Constricted, reactive.  Thought Process:  Coherent, clear; intermittently disorganized  Associations: Goal oriented  Thought Content:  References delusional thinking to include being "called by the [employer]".  No  depressive cognitions.  Denies thoughts of self-harm, suicide, homicide, or violence.  No paranoia.  Perceptions: Auditory hallucinations.  Does not appear to be responding to internal stimuli  Insight: Limited  Judgment: Poor  Cognition:   Level of Consciousness: Intact  Orientation: Intact to self, place and time.  Day, date, year, location.    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  Plan to continue working with medicine service for as rapid a taper of prednisone as possible with anticipated return to baseline mental status and disposition to a less restrictive environment in 5 to 7 days.  Continue current medication regimen of 20 mg olanzapine at at bedtime.  He is cleared from his SARS-CoV-2 isolation and can be transferred to a non-COVID unit as soon as available.    Likely disposition will be returned to hotel where he was previously living and follow-up with Adult Protective Services.  Patient remains appropriate for an inpatient psychiatric setting given the severity of his impaired  judgment and inability to maintain his ADLs secondary to his underlying psychiatric and medical conditions.      Plan / Recommendations:  Biological Plan:  Medications:          Current Facility-Administered Medications   Medication Dose Route Frequency    amLODIPine  10 mg Oral Daily    folic acid  1 mg Oral Daily    multivitamin   1 tablet Oral Daily    OLANZapine  20 mg Oral QHS    predniSONE  20 mg Oral QAM W/BREAKFAST    propranolol  20 mg Oral Q8H SCH    thiamine  100 mg Oral Daily       Medical Work-up:  Patient on colchicine and 7 day prednisone bolus for gout, now tapered to 20 mg daily. Asymptomatic from SARS-CoV-2.    Consults: Internal Medicine following gout and SARS-CoV-2. Fever spike on 10/08/2020.  Asymptomatic. Cleared from isolation 10/17/2020.    Psychosocial Plan:  Individual therapy: Psycho-education and psychotherapy of the following modalities will continue to be provided during daily visits:Supportive     Group and milieu therapies: daily per unit's schedule.    Continue with Social Work intervention provided for discharge planning including assistance with placement, case management referral and follow-up psychiatric care.    Plan for Family Involvement: sister , pending callback to sW      10/18/2020: Recrudescence of acute psychotic symptoms with delusional thinking appear to be induced secondary to prednisone bolus.  Plan to continue working with medicine service for as rapid a taper of prednisone as possible with anticipated return to baseline mental status and disposition to a less restrictive environment in 5 to 7 days.  Continue current medication regimen of 20 mg olanzapine at at bedtime.  He is cleared from his SARS-CoV-2 isolation and can be transferred to a non-COVID unit as soon as available.    Likely disposition will be returned to hotel where he was previously living and follow-up with Adult Protective Services.  Patient remains appropriate for an inpatient psychiatric setting given the severity of his impaired judgment and inability to maintain his ADLs secondary to his underlying psychiatric and medical conditions.      Plan / Recommendations:  Biological Plan:  Medications:          Current Facility-Administered Medications   Medication Dose Route Frequency    amLODIPine  10 mg Oral Daily     folic acid  1 mg Oral Daily    multivitamin  1 tablet Oral Daily    OLANZapine  20 mg Oral QHS    predniSONE  20 mg Oral QAM W/BREAKFAST    propranolol  20 mg Oral Q8H SCH    thiamine  100 mg Oral Daily       Medical Work-up:  Patient on colchicine and 7 day prednisone bolus for gout, now tapered to 20 mg daily. Asymptomatic from SARS-CoV-2.    Consults: Internal Medicine following gout and SARS-CoV-2. Fever spike on 10/08/2020.  Asymptomatic. Cleared from isolation 10/17/2020.    Psychosocial Plan:  Individual therapy: Psycho-education and psychotherapy of the following modalities will continue to be provided during daily visits:Supportive     Group and milieu therapies: daily per unit's schedule.    Continue with Social Work intervention provided for discharge planning including assistance with placement, case management referral and follow-up psychiatric care.    Plan for Family Involvement: sister , pending callback to sW      Review [facility]  TDO  LIPOS  Iantha Fallen, RN,BSN  Utilization Review Psychiatry  [facility]  [phone] Confidential VM  [phone] Fax UR Dept

## 2020-10-18 NOTE — Plan of Care (Signed)
Problem: At Risk for Harm to Self: AS EVIDENCED BY...  Goal: Reduction of self-harm thoughts and no attempts  Outcome: Progressing     Problem: At Risk for Harm to Self: AS EVIDENCED BY...  Goal: Verbalizes understanding of medication, benefits, and side effects  Outcome: Progressing     Kristopher Rogers is A+Ox2-3, suspicious and somewhat guarded on approach. Generally compliant with isolation precautions, but needs some reminders to wear a mask when coming to his doorway to make requests. Denied SI/HI/AVH when asked, but was observed responding to internal stimuli during med pass. Describes his mood as good. Tremor noted. Compliant with scheduled medications. He rested in bed with eyes closed throughout most of the night in no apparent distress. Slept about 6 hours. Will continue to monitor.

## 2020-10-18 NOTE — Plan of Care (Signed)
60 year old male with no prior psychiatric history hypertension, essential tremors, admitted to medicine service 12/31 through 1/2 as TDO for paranoid and suicidal ideations, possible alcohol withdrawal, AKI, and transferred to inpatient psychiatry on 1/2. Medicine consulted on 1/6 for worsening renal function, with creatinine up to 1.7 from prior 1.1. Recently initiated on colchicine and Advil for possible gout flare versus hand trauma. Colchicine and NSAID placed on hold and creatinineimproved. Patient was found to be positive for COVID on 1/7 after being tested after potential exposure. Febrile on 1/8, likely in setting of COVID.Concern for acute gout flare of R second MCP joint and initiated on prednisone, colchicine on 1/12.    Tested + for Covid 19 on 1/7 w/o hypoxia or fevers. Vaccination status unknown.  Chart check    1/15: Vitals remain stable. Sat 99% RA. Blood work was ordered for this am but was not collected ( ? Refused?). Pt is again with psychotic and paranoid ideations and refused to take am meds per RN and also walked to hallway w/o mask. Remains on Covid 19 isolation protocol since 1/7 and remains asymptomatic beyond fever on 1/8.    1/16: Chart check. Vitals stable. No hypoxia. No reported issues of pain/gout sxs. Ongoing confusion and psychosis. BMP today with Cr 1.1, bun 29.    1/17: Pt seen per NP request to possibly lower the Prednisone.  Pt is feeling better with resolved pain in the R ankle/toe and much improved pain and swelling R 2.nd MCP joint.  He is now able to make a fist which a big improvement.  He remains asymptomatic from Covid. He feels that he is better from psych stand point and is asking if he can go home. Defer to psych.     1/18: Chary check.  Bp up/down likely related to intermittent anxiety and agitation.      A/P  Acute Gout flare R 2nd MCP and R ankle/great toe.   AKI, suspect related to NSAID, now resolved-> cr 0.7 now from peak of 1.7  SIRS criteria,  suspect secondary to COVID, resolved  COVID positive,no hypoxia or fevers ( he actually did have a 101 fever on 1/8) .  Hypertension  Acute psychosis-> possibly Schizophrenic.  Hx goutwith possible acute flare of 2nd R MCP   R hand trauma  Essential tremors on propranolol     Plan:   - Completing the second and last dose of prednisone tomorrow. Hopefully getting off prednisone will help his mental status/anxiety although he is not on very high dose.   - Completed 6 days of Colchicine   - He states that he rarely has attacks and when he does he takes she colchicine which he feels works the best. Indomethacin did not work well for him. He is not interested in starting any ppx tx.   - Cont. Propranolol and Norvasc -> would not uptitrate for intermittent bp spikes and while on prednisone.   - Given Covid + monitor for hypoxia and fevers->so far none. He needs eventual Covid vaccination regardless of testing + since he is likely unvaccinated. The Prednisone may be masking some of the Covid sxs as well. He is > 10 days out from + test/sxs and should be ok to [place] isolation per guidelines.   - Uric acid level normal   - Will follow.

## 2020-10-18 NOTE — Progress Notes (Signed)
Psychiatry Progress Note  (Level 1 = Problem Focused, Level 2 = Expanded Problem Focused, Level 3 = Detailed)    Patient Name: Kristopher Rogers              Current Date/Time:  1/18/20221:27 PM  MRN:  [id number]                              Attending Physician: Claudean Severance, MD  DOB: Feb 04, 1961                                Gender: male                              I. History   Informants: patient, patient chart, clinical staff    Appreciate internal medicine hospitalist follow-up. Note prednisone taper.    A. Chief Complaint or Reason for Admission    Suicidal ideation, paranoia, bizarre behavior, inability to maintain ADLs; alcohol withdrawal    60 y.o. male, in a relationship, unstable housing, unclear current employment,  with PMHx of HTN, gout, essential tremors, initially admitted from ED to medical unit on 09/30/2020 for alcohol withdrawal, AKI; on 10/02/2020, pt was admitted to psychiatry for a history of anxiety and alcohol use disorder that was admitted under a TDO in the setting of increased paranoia and SI over the last several weeks. TDO hearing on 10/03/2020; committed for up to 30 days. Tested + for sars-cov-2 on 10/07/2020.     B.History of Present Illness: Interval History     (Symptoms and qualifiers:1 for Level 1, 2 for Level 2 and 3+ for Level 3)     Interdisciplinary Treatment Team (ITP) update held today with patient and clinical team.    Seen this morning in his darkened room, lights on, shades closed.  He reports that he is doing "well" but admits to some increased anxiety and restlessness since initiating the prednisone.  He goes on to state that he has had no other issues; however, upon further discussion he admits that he was "called by the [employer]" to report for "a drill" downstairs and he subsequently tried to elope in order to participate over the weekend.  When asked if they were auditory hallucinations he says, "no they were real, like over the loud speaker."  He denies thoughts of  self-harm, suicide, or aggression.  He has not yet spoken with his family.  He reports no issues with sleep and maintains a good appetite.    MAR: Medication compliant.  No as needed's.    C.Medical History  Review of Systems  (Level 1 is none, Level 2 is 1, and Level 3 is 2+)  Psychiatric: As above. Does not appear depressed, sad, or anxious. Denies SI/HI. Denies intrusive thoughts. Eating well.  Attending to ADLs.  Poor sleep overnight.  Endorses auditory but denies visual hallucinations.  Denies feeling paranoid or anxious.  Denies intrusive thoughts.  Constitutional: No complaints  Respiratory: No complaints  Musculoskeletal: Reports decreased pain in his left foot and right hand.    D.Additional Past Psychiatric, Substance Use, Medical, Family and Social History  Psychiatric: No new information available as compared to previous encounter(s)  Substance Use: No new information available as compared to previous encounter(s)  Medical: No new information available as compared to previous encounter(s)  Family: No  new information available as compared to previous encounter(s)  Social: No new information available as compared to previous encounter(s)    Medications:   Current Medications  Report    Scheduled     Medication Ordered Dose/Rate, Route, Frequency Last Action    amLODIPine (NORVASC) tablet 10 mg 10 mg, PO, Daily Given, 10 mg at 01/18 0839    folic acid (FOLVITE) tablet 1 mg 1 mg, PO, Daily Given, 1 mg at 01/18 0840    multivitamin tablet 1 tablet 1 tablet, PO, Daily Given, 1 tablet at 01/18 0839    OLANZapine (ZyPREXA) tablet 20 mg 20 mg, PO, QHS Given, 20 mg at 01/17 2103    predniSONE (DELTASONE) tablet 20 mg 20 mg, PO, QAM W/BREAKFAST Given, 20 mg at 01/18 0839    propranolol (INDERAL) tablet 20 mg 20 mg, PO, Q8H SCH Given, 20 mg at 01/18 5784    thiamine (VITAMIN B1) tablet 100 mg 100 mg, PO, Daily Given, 100 mg at 01/18 0840          PRN     Medication Ordered Dose/Rate, Route, Frequency Last Action     acetaminophen (TYLENOL) suppository 650 mg (Or Linked Group #1) 650 mg, RE, Q6H PRN See Alternative, 01/14 2256    acetaminophen (TYLENOL) tablet 650 mg (Or Linked Group #1) 650 mg, PO, Q6H PRN Given, 650 mg at 01/08 2019    alum & mag hydroxide-simethicone (MAALOX PLUS) 200-200-20 mg/5 mL suspension 15 mL 15 mL, PO, Q4H PRN Given, 15 mL at 01/04 1703    benzocaine-menthol (CEPACOL) lozenge 1 lozenge 1 lozenge, BU, Q1H PRN Given, 1 lozenge at 01/14 2104    benzonatate (TESSALON) capsule 100 mg 100 mg, PO, TID PRN Ordered    dextromethorphan-guaiFENesin (MUCINEX DM) 30-600 MG per 12 hr tablet 1 tablet 1 tablet, PO, BID PRN Given, 1 tablet at 01/10 1113    guaiFENesin-codeine (ROBITUSSIN AC) 100-10 MG/5ML syrup 5 mL 5 mL, PO, Q6H PRN Given, 5 mL at 01/12 2301    haloperidol (HALDOL) tablet 5 mg 5 mg, PO, Q4H PRN Given, 5 mg at 01/16 2100    hydrALAZINE (APRESOLINE) injection 10 mg 10 mg, IV, Q6H PRN Ordered    hydrOXYzine (VISTARIL) capsule 25 mg 25 mg, PO, Q6H PRN Given, 25 mg at 01/16 2101    lidocaine (LIDODERM) 5 % 1 patch 1 patch, TD, Q24H PRN Ordered    ondansetron (ZOFRAN) tablet 4 mg 4 mg, PO, Q8H PRN Given, 4 mg at 01/04 1703                II. Examination   Vital signs reviewed:   Blood pressure (!) 171/105, pulse 74, temperature 97.9 F (36.6 C), temperature source Oral, resp. rate 12, height 1.803 m (5' 10.98"), weight 86.2 kg (190 lb 0.6 oz), SpO2 100 %.     Mental Status Exam  (Level 1 is 1-5, Level 2 is 6-8, Level 3 is 9+)  General appearance: Dressed casually in hospital attire, disheveled.  Reasonable hygiene.  Comfortable.   Attitude/Behavior: Pleasant, appropriate, with reasonable eye contact.  Engaging.  Slightly befuddled/confused.  Motor: Essential tremor bilaterally  Gait: Ambulates without impairment.  Muscle strength and tone: Grossly intact  Speech:   Spontaneous: Yes  Rate and Rhythm: Normal  Volume: Normal  Tone: Monotonous  Clarity: Yes  Mood: Euthymic  Affect:  Constricted,  reactive.  Thought Process:  Coherent, clear; intermittently disorganized  Associations: Goal oriented  Thought Content:  References delusional thinking  to include being "called by the [employer]".  No depressive cognitions.  Denies thoughts of self-harm, suicide, homicide, or violence.  No paranoia.  Perceptions: Auditory hallucinations.  Does not appear to be responding to internal stimuli  Insight: Limited  Judgment: Poor  Cognition:   Level of Consciousness: Intact  Orientation: Intact to self, place and time.  Day, date, year, location.    Psychiatric / Cognitive Instruments: None    Pertinent Physical Exam: Not relevant to current chief complaint/reason for admission    Imaging / EKG / Labs:   Labs in the last 24 hours  Results     ** No results found for the last 24 hours. **          III. Assessment and Plan (Medical Decision Making)     1. I certify that this patient continues to require inpatient hospitalization due to unable to care for self in the community with insufficient support available    2. 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 Past medical records reviewed. Coordination of care was discussed with inpatient team and as available with the outpatient team.    4. On this admission patient educated about and provided input into their treatment plan.  Patient understands potential risks and benefits of proposed treatment plan.     5.  Assessment / Impression   60 y.o. male,  residing in a hotel for the past 4 years, unclear current employment,  with PMHx of HTN, essential tremors, initially admitted from ED to medical unit on 09/30/2020 for alcohol withdrawal, AKI; on 10/02/2020, pt was admitted to psychiatry for a history of anxiety and alcohol use disorder that was admitted under a TDO in the setting of increased paranoia and SI over the last several weeks.     TDO hearing on 10/03/2020; committed for up to 30 days. Tested + for SARS-CoV-2 on 10/07/2020.     Suicide Risk Assessment  Denies thoughts of self-harm, suicide, or aggression.  Increased prognostic risk factors include history of severe alcoholism with primary thought disorder (schizophrenia), poor social support, financial and domicile insecurity      10/18/2020: Recrudescence of acute psychotic symptoms with delusional thinking appear to be induced secondary to prednisone bolus.  Plan to continue working with medicine service for as rapid a taper of prednisone as possible with anticipated return to baseline mental status and disposition to a less restrictive environment in 5 to 7 days.  Continue current medication regimen of 20 mg olanzapine at at bedtime.  He is cleared from his SARS-CoV-2 isolation and can be transferred to a non-COVID unit as soon as available.    Likely disposition will be returned to hotel where he was previously living and follow-up with Adult Protective Services.  Patient remains appropriate for an inpatient psychiatric setting given the severity of his impaired judgment and inability to maintain his ADLs secondary to his underlying psychiatric and medical conditions.      Plan / Recommendations:  Biological Plan:  Medications:   Current Facility-Administered Medications  Medication Dose Route Frequency    amLODIPine  10 mg Oral Daily    folic acid  1 mg Oral Daily    multivitamin  1 tablet Oral Daily    OLANZapine  20 mg Oral QHS    predniSONE  20 mg Oral QAM W/BREAKFAST    propranolol  20 mg Oral  Q8H SCH    thiamine  100 mg Oral Daily       Medical Work-up:  Patient on colchicine and 7 day prednisone bolus for gout, now tapered to 20 mg daily. Asymptomatic from SARS-CoV-2.    Consults: Internal Medicine following gout and SARS-CoV-2. Fever spike on 10/08/2020.  Asymptomatic. Cleared from isolation 10/17/2020.    Psychosocial Plan:  Individual therapy: Psycho-education and psychotherapy of the following modalities will continue to be provided during daily visits:Supportive     Group and milieu therapies: daily per unit's schedule.    Continue with Social Work intervention provided for discharge planning including assistance with placement, case management referral and follow-up psychiatric care.    Plan for Family Involvement: sister , pending callback to sW    Other Providers Contact Information and Dates Contacted: No Updates    Total provider time spent 25 minutes (floor time) with more than 50 percent of time in direct patient contact, coordinating care and counseling.    Signed by: Claudean Severance, MD  10/18/2020  1:27 PM

## 2020-10-18 NOTE — Treatment Plan (Cosign Needed)
Interdisciplinary Treatment Plan Update Meeting    10/18/2020  Kristopher Rogers    Participants:  Patient:  Kristopher Rogers  Attending Physician:  Claudean Severance, MD  RN: Shawna Clamp    Short Term Goal:To go to groups  Long Term Goal work on discharge planning    Objective:  Review response to treatment, reassess needs/goals, update plan as indicated incorporating patient's strengths and stated needs, goals, and preferences.      1. Summary of Patient Progress on Treatment Plan Goals:    He reports that he is doing "well" but admits to some increased anxiety and restlessness since initiating the prednisone.  He goes on to state that he has had no other issues; however, upon further discussion he admits that he was "called by the [employer]" to report for "a drill" downstairs and he subsequently tried to elope in order to participate over the weekend.  When asked if they were auditory hallucinations he says, "no they were real, like over the loud speaker."  He denies thoughts of self-harm, suicide, or aggression.  He has not yet spoken with his family.  He reports no issues with sleep and maintains a good appetite.  2. Level of Patient Involvement:  Passively engaged    3. Patient Understanding of Plan of Care:  Able to verbalize goals and interventions    4. Level of Agreement/Commitment to Plan of Care:  Passively agrees with plan of care          Contributor Signatures:      MD_________________________________ Date___________________    SW_________________________________Date ___________________    RN _________________________________Date____________________    Patient_______________________________Date____________________    Other________________________________Date ___________________    Other________________________________Date ___________________    (This document is signed electronically by Clinical research associate and electronic co-signer.  Other participants sign a printed copy which is scanned into the EMR)

## 2020-10-18 NOTE — Progress Notes (Signed)
Checklist instructions: Score the patient once a shift and as needed. Absence of a behavior results in a score of 0. Presence or increase in the display of a behavior results in a score of 1. Maximum score (SUM) possible is 6. If a behavior is at baseline for a patient, e.g. the patient is normally confused, this will result in a score of 0. An increase in confusion will result in a score of 1.      Jahlani Shanti Eichel is being assessed under the Broset Violence Checklist for any escalations in potentially violent or harmful behavior.     Date of Admission:               10/02/20                                                          Date of Assessment:    Assessment Time 0800      Confused 0      Irritable 0      Boisterous 0      Verbal Threats 0      Physical Threats 0      Attacking Objects 0      SUM 0          TW attempted the following interventions based on the patient's sum total score assessed on the BVC: 1= Monitoring.      Should the patient require multiple interventions or fails to engage in de-escalation with staff, please review the plan for managing the care of an escalated patient.  Please do the following:  1.  Immediately notify Security and Nursing Administrative Supervisor/Director to respond  2.  Conduct team huddle to rule out clinical causes for escalated behavior  3.  Review the note authored by the Associate Professor for additional information  4.  Document specific behaviors observed  5.  Consider SAFE Team call for additional resources              The Broset Violence Checklist (BVC) is a 6 item inventory that was designed to assist in the prediction of imminent violent behavior (24 hours perspective) in healthcare and other sectors where workplace violence is acknowledged as a serious problem.   The BVC does not give instructions on what to do or what interventions to provide when a violent episode occurs; it is a took meant to aid in the risk assessment process. Interventions are based  on the individual, the environment, and cultural sensitivity indicators.

## 2020-10-18 NOTE — Progress Notes (Signed)
Per NP, Kristopher Rogers, she spoke with pts daughter over the weekend and pts family is currently considering how they can assist with pts disposition at time of [place]. SW left a message for pts daughter, Kristopher Rogers, and requested a return call to discuss. SW left appropriate contact information.

## 2020-10-18 NOTE — Progress Notes (Signed)
Daily Note:    Affect/Mood:  Appropriate    Thought Process:  Logical    Thought Content:  Within Normal Limits    Interpersonal:  Provided Feedback, Cooperative    Treatment Plan Goal Addressed:     Learn positive coping skills and express feelings regarding hospitalization.    Intervention Used and Patient Response:     No groups done in the unit due to COVID isolation. This Therapist 1:1 met with Pt in patients room today. Pt was laying on his bed in his room and was watching TV. Pt also had the lights off in his room and stated that, when I dim the light in my room it helps me feel better. I dont like too much light. Pt was very pleasant and cooperative during the session. Pt reported that, I am feeling a lot better, my mind is clear, also I can eat and drink. Pt stated that, I have realized that dehydration causing a lot of problems and I am trying to drink more water. Pt also asked to provide him more ice and juice, and this Therapist informed patients nurse.  Therapist offered art supplies and encouraged Pt to use the art supplies, also reading materials. Pt asked for magazine about sport and music. Therapist provided a magazine also worksheets on stress management exploration and stress management tips. Therapist will continue assessing and providing support as needed.

## 2020-10-18 NOTE — Progress Notes (Addendum)
SW attempted to speak with the pt over the intercom in his room. Pt confirmed being able to hear this SW. He declined to speak with the SW as he stated he was on a phone call.     Addendum: SW spoke with bedside RN, pt has no phone in his room and has not been given one to make calls. Pt experiencing delusions at this time.

## 2020-10-18 NOTE — Plan of Care (Addendum)
Problem: At Risk for Harm to Self: AS EVIDENCED BY...  Goal: Identifies stressors, protective factors, and coping strategies  Outcome: Progressing  Goal: Verbalizes understanding of medication, benefits, and side effects  Outcome: Progressing  Goal: Identify and participate in supportive program therapies  Outcome: Progressing   Patient is alert oriented to  self,observed  talking to him self..pacing . report  his  mood as ''good'' pt is paranoid and delusional , refused to take morning  med's  unless he  ask ''them'' accepted medication after asking '' can I take them?''He  has remain calm easily  redirected,remained in the room.  Need to be reminded to wear mask when coming to the door.Denies  suicidal thoughts denies hearing voices ,observed responding to internal stimuli.appetite is good,encouraged to take  shower,assist with needed care.will continue  to monitor for safety.

## 2020-10-19 NOTE — Progress Notes (Signed)
Checklist instructions: Score the patient once a shift and as needed. Absence of a behavior results in a score of 0. Presence or increase in the display of a behavior results in a score of 1. Maximum score (SUM) possible is 6. If a behavior is at baseline for a patient, e.g. the patient is normally confused, this will result in a score of 0. An increase in confusion will result in a score of 1.        Kristopher Rogers is being assessed under the Broset Violence Checklist for any escalations in potentially violent or harmful behavior.             Date of Admission:               10/18/20                                                          Date of Assessment:    Assessment Time 2000         Confused 0         Irritable 0         Boisterous 0         Verbal Threats 0         Physical Threats 0         Attacking Objects 0         SUM 0               TW attempted the following interventions based on the patient's sum total score assessed on the BVC: 1= Monitoring.        Should the patient require multiple interventions or fails to engage in de-escalation with staff, please review the plan for managing the care of an escalated patient.  Please do the following:  1.  Immediately notify Security and Nursing Administrative Supervisor/Director to respond  2.  Conduct team huddle to rule out clinical causes for escalated behavior  3.  Review the note authored by the Associate Professor for additional information  4.  Document specific behaviors observed  5.  Consider SAFE Team call for additional resources                    The Broset Violence Checklist (BVC) is a 6 item inventory that was designed to assist in the prediction of imminent violent behavior (24 hours perspective) in healthcare and other sectors where workplace violence is acknowledged as a serious problem.   The BVC does not give instructions on what to do or what interventions to provide when a violent episode occurs; it is a took meant to aid in the risk  assessment process. Interventions are based on the individual, the environment, and cultural

## 2020-10-19 NOTE — Progress Notes (Signed)
Psychiatry Progress Note  (Level 1 = Problem Focused, Level 2 = Expanded Problem Focused, Level 3 = Detailed)    Patient Name: Kristopher Rogers              Current Date/Time:  1/19/20224:08 PM  MRN:  [id number]                              Attending Physician: Claudean Severance, MD  DOB: 1961-08-17                                Gender: male                              I. History   Informants: patient, patient chart, clinical staff    Appreciate internal medicine hospitalist follow-up. Note prednisone taper.    A. Chief Complaint or Reason for Admission    Suicidal ideation, paranoia, bizarre behavior, inability to maintain ADLs; alcohol withdrawal    60 y.o. male, in a relationship, unstable housing, unclear current employment,  with PMHx of HTN, gout, essential tremors, initially admitted from ED to medical unit on 09/30/2020 for alcohol withdrawal, AKI; on 10/02/2020, pt was admitted to psychiatry for a history of anxiety and alcohol use disorder that was admitted under a TDO in the setting of increased paranoia and SI over the last several weeks. TDO hearing on 10/03/2020; committed for up to 30 days. Tested + for sars-cov-2 on 10/07/2020.     B.History of Present Illness: Interval History     (Symptoms and qualifiers:1 for Level 1, 2 for Level 2 and 3+ for Level 3)     Seen this morning with clinical team, patient remains loose and mildly disorganized, confused.  He reports gout is much improved with minimal pain.  He is able to ambulate without difficulty.  He is maintaining his ADLs but remains disheveled.  His room is in disarray.  Denies thoughts of self-harm, suicide, or aggression.  He is interested as to when he will be discharged but has minimal interest in substance abuse treatment and exhibits significant amounts of denial.  His plan after discharge is to resume living in a hotel.    MAR: Medication compliant.  No PRNs.    C.Medical History  Review of Systems  (Level 1 is none, Level 2 is 1, and Level 3 is  2+)  Psychiatric: As above. Does not appear depressed, sad, or anxious. Denies SI/HI. Denies intrusive thoughts. Eating well.  Attending to ADLs.  Sleep mildly improved.  Denies auditory or visual hallucinations.  Denies feeling paranoid or anxious.  Denies intrusive thoughts.  Constitutional: No complaints  Respiratory: No complaints  Musculoskeletal: Denies pain.    D.Additional Past Psychiatric, Substance Use, Medical, Family and Social History  Psychiatric: No new information available as compared to previous encounter(s)  Substance Use: No new information available as compared to previous encounter(s)  Medical: No new information available as compared to previous encounter(s)  Family: Per social worker discussion, family is agreeable to assist if patient returns to [place].  Social: No new information available as compared to previous encounter(s)    Medications:   Current Medications  Report    Scheduled     Medication Ordered Dose/Rate, Route, Frequency Last Action    amLODIPine (NORVASC) tablet 10 mg 10  mg, PO, Daily Given, 10 mg at 01/19 0847    folic acid (FOLVITE) tablet 1 mg 1 mg, PO, Daily Given, 1 mg at 01/19 0847    multivitamin tablet 1 tablet 1 tablet, PO, Daily Given, 1 tablet at 01/19 0847    OLANZapine (ZyPREXA) tablet 20 mg 20 mg, PO, QHS Given, 20 mg at 01/18 2134    propranolol (INDERAL) tablet 20 mg 20 mg, PO, Q8H SCH Given, 20 mg at 01/19 1331    thiamine (VITAMIN B1) tablet 100 mg 100 mg, PO, Daily Given, 100 mg at 01/19 0847          PRN     Medication Ordered Dose/Rate, Route, Frequency Last Action    acetaminophen (TYLENOL) suppository 650 mg (Or Linked Group #1) 650 mg, RE, Q6H PRN See Alternative, 01/14 2256    acetaminophen (TYLENOL) tablet 650 mg (Or Linked Group #1) 650 mg, PO, Q6H PRN Given, 650 mg at 01/08 2019    alum & mag hydroxide-simethicone (MAALOX PLUS) 200-200-20 mg/5 mL suspension 15 mL 15 mL, PO, Q4H PRN Given, 15 mL at 01/04 1703    benzocaine-menthol (CEPACOL)  lozenge 1 lozenge 1 lozenge, BU, Q1H PRN Given, 1 lozenge at 01/14 2104    benzonatate (TESSALON) capsule 100 mg 100 mg, PO, TID PRN Ordered    dextromethorphan-guaiFENesin (MUCINEX DM) 30-600 MG per 12 hr tablet 1 tablet 1 tablet, PO, BID PRN Given, 1 tablet at 01/10 1113    guaiFENesin-codeine (ROBITUSSIN AC) 100-10 MG/5ML syrup 5 mL 5 mL, PO, Q6H PRN Given, 5 mL at 01/12 2301    haloperidol (HALDOL) tablet 5 mg 5 mg, PO, Q4H PRN Given, 5 mg at 01/16 2100    hydrALAZINE (APRESOLINE) injection 10 mg 10 mg, IV, Q6H PRN Ordered    hydrOXYzine (VISTARIL) capsule 25 mg 25 mg, PO, Q6H PRN Given, 25 mg at 01/16 2101    lidocaine (LIDODERM) 5 % 1 patch 1 patch, TD, Q24H PRN Ordered    ondansetron (ZOFRAN) tablet 4 mg 4 mg, PO, Q8H PRN Given, 4 mg at 01/04 1703         Incomplete Documentation     Medication Ordered Dose/Rate, Route, Frequency Due    propranolol (INDERAL) tablet 20 mg 20 mg, PO, Q8H [facility] 01/19 0633                II. Examination   Vital signs reviewed:   Blood pressure 130/86, pulse 88, temperature 97.3 F (36.3 C), temperature source Oral, resp. rate 18, height 1.803 m (5' 10.98"), weight 86.2 kg (190 lb 0.6 oz), SpO2 99 %.     Mental Status Exam  (Level 1 is 1-5, Level 2 is 6-8, Level 3 is 9+)  General appearance: Dressed casually in hospital attire, disheveled.  Reasonable hygiene.  Comfortable.   Attitude/Behavior: Pleasant, appropriate, with reasonable eye contact.  Engaging.  No pain behaviors.  Motor: Essential tremor bilaterally  Gait: Ambulates without impairment.  Muscle strength and tone: Grossly intact  Speech:   Spontaneous: Yes  Rate and Rhythm: Normal  Volume: Normal  Tone: Monotonous  Clarity: Yes  Mood: Euthymic  Affect:  Constricted, reactive.  Thought Process:  Coherent, clear; intermittently disorganized  Associations: Goal oriented  Thought Content:  Delusional.  No depressive cognitions.  Denies thoughts of self-harm, suicide, homicide, or violence.  No overt paranoia.  Perceptions:  Auditory hallucinations.  Does not appear to be responding to internal stimuli  Insight: Poor  Judgment: Poor  Cognition:  Level of Consciousness: Intact  Orientation: Intact to self, place and time.  Day, date, year, location.    Psychiatric / Cognitive Instruments: None    Pertinent Physical Exam: Not relevant to current chief complaint/reason for admission    Imaging / EKG / Labs:   Labs in the last 24 hours  Results     ** No results found for the last 24 hours. **          III. Assessment and Plan (Medical Decision Making)     1. I certify that this patient continues to require inpatient hospitalization due to unable to care for self in the community with insufficient support available    2. 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 Past medical records reviewed. Coordination of care was discussed with inpatient team and as available with the outpatient team.    4. On this admission patient educated about and provided input into their treatment plan.  Patient understands potential risks and benefits of proposed treatment plan.     5.  Assessment / Impression   60 y.o. male, residing in a hotel for the past 4 years, unclear current employment,  with PMHx of HTN, essential tremors, initially admitted from ED to medical unit on 09/30/2020 for alcohol withdrawal, AKI; on 10/02/2020, pt was admitted to psychiatry for a history of anxiety and alcohol use disorder that was admitted under a TDO in the setting  of increased paranoia and SI over the last several weeks.     TDO hearing on 10/03/2020; committed for up to 30 days. Tested + for SARS-CoV-2 on 10/07/2020.     Suicide Risk Assessment  Denies thoughts of self-harm, suicide, or aggression.  Increased prognostic risk factors include history of severe alcoholism with primary thought disorder (schizophrenia), poor social support, financial and domicile insecurity      10/18/2020: Recrudescence of acute psychotic symptoms with delusional thinking appear to be induced secondary to prednisone bolus.  Plan to continue working with medicine service for as rapid a taper of prednisone as possible with anticipated return to baseline mental status and disposition to a less restrictive environment in 5 to 7 days.  Continue current medication regimen of 20 mg olanzapine at at bedtime.  He is cleared from his SARS-CoV-2 isolation and can be transferred to a non-COVID unit as soon as available.    10/19/2018: Continues to exhibit A criteria symptoms consistent with schizophrenia, chronic, paranoid type likely exacerbated by steroid bolus even though low-dose.  His mental status is clearly more disorganized and delusional since restarting the steroids last week.  Plan to continue him on 20 mg olanzapine pending return to his baseline mental status as his prednisone is completed.  His current level of disorganization, paranoia, and delusions continue to make him highly likely to recrudesce and decompensate outside of a structured environment. Anticipate length of stay 3 to 5 days.    Likely disposition will be returned to hotel where he was previously living. He remains resistant to substance abuse treatment.  Patient remains  appropriate for an inpatient psychiatric setting given the severity of his impaired judgment and inability to maintain his ADLs secondary to his underlying psychiatric and medical conditions.    Plan / Recommendations:  Biological Plan:  Medications:   Current  Facility-Administered Medications   Medication Dose Route Frequency    amLODIPine  10 mg Oral Daily    folic acid  1 mg Oral Daily    multivitamin  1 tablet Oral Daily    OLANZapine  20 mg Oral QHS    propranolol  20 mg Oral Q8H SCH    thiamine  100 mg Oral Daily       Medical Work-up:  Patient on colchicine and 7 day prednisone bolus for gout, now tapered to 20 mg daily. Asymptomatic from SARS-CoV-2.    Consults: Internal Medicine following gout and SARS-CoV-2. Fever spike on 10/08/2020.  Asymptomatic. Cleared from isolation 10/17/2020.  On prednisone taper, final dose of 7 days now at 20 mg tomorrow.    Psychosocial Plan:  Individual therapy: Psycho-education and psychotherapy of the following modalities will continue to be provided during daily visits:Supportive     Group and milieu therapies: daily per unit's schedule.    Continue with Social Work intervention provided for discharge planning including assistance with placement, case management referral and follow-up psychiatric care.    Plan for Family Involvement: sister , pending callback to sW    Other Providers Contact Information and Dates Contacted: No Updates    Total provider time spent 25 minutes (floor time) with more than 50 percent of time in direct patient contact, coordinating care and counseling.    Signed by: Claudean Severance, MD  10/19/2020  4:08 PM

## 2020-10-19 NOTE — Plan of Care (Signed)
60 year old male with no prior psychiatric history hypertension, essential tremors, admitted to medicine service 12/31 through 1/2 as TDO for paranoid and suicidal ideations, possible alcohol withdrawal, AKI, and transferred to inpatient psychiatry on 1/2. Medicine consulted on 1/6 for worsening renal function, with creatinine up to 1.7 from prior 1.1. Recently initiated on colchicine and Advil for possible gout flare versus hand trauma. Colchicine and NSAID placed on hold and creatinineimproved. Patient was found to be positive for COVID on 1/7 after being tested after potential exposure. Febrile on 1/8, likely in setting of COVID.Concern for acute gout flare of R second MCP joint and initiated on prednisone, colchicine on 1/12.    Tested + for Covid 19 on 1/7 w/o hypoxia or fevers. Vaccination status unknown.  Chart check    1/15: Vitals remain stable. Sat 99% RA. Blood work was ordered for this am but was not collected ( ? Refused?). Pt is again with psychotic and paranoid ideations and refused to take am meds per RN and also walked to hallway w/o mask. Remains on Covid 19 isolation protocol since 1/7 and remains asymptomatic beyond fever on 1/8.    1/16: Chart check. Vitals stable. No hypoxia. No reported issues of pain/gout sxs. Ongoing confusion and psychosis. BMP today with Cr 1.1, bun 29.    1/17: Pt seen per NP request to possibly lower the Prednisone. Pt is feeling better with resolved pain in the R ankle/toe and much improved pain and swelling R 2.nd MCP joint. He is now able to make a fist which a big improvement. He remains asymptomatic from Covid. He feels that he is better from psych stand point and is asking if he can go home. Defer to psych.     1/18: Chart check.  Bp up/down likely related to intermittent anxiety and agitation.    1/19: Chart check. Vitals overall stable.     A/P  Acute Gout flare R 2nd MCP and R ankle/great toe.  AKI, suspect related to NSAID, now resolved->  cr 0.7 now from peak of 1.7  SIRS criteria, suspect secondary to COVID, resolved  COVID positive,no hypoxia or fevers ( he actually did have a 101 fever on 1/8) .  Hypertension  Acute psychosis-> possibly Schizophrenic.  Hx goutwith possible acute flare of 2nd R MCP   R hand trauma  Essential tremors on propranolol     Plan:  - Completing the Prednisone today. Completed 6 days of Colchicine. Monitor for recurrence of gout sxs.   -Cont. Propranolol and Norvasc -> would not uptitrate for intermittent bp spikes.   - Given Covid + monitor for hypoxia and fevers->so far none. He needs eventual Covid vaccination regardless of testing + since he is likely unvaccinated. The Prednisone may be masking some of the Covid sxs as well. He is > 10 days out from + test/sxs and should be ok to [place] isolation per guidelines.   - Uric acid level normal   - Will follow.

## 2020-10-19 NOTE — Progress Notes (Signed)
Daily Note:    Treatment Plan Goal:  Remains safe during hospitalization. Attends minimum number of therapies daily.    Therapeutic Interventions:  No groups offered on the unit due to COVID isolation. The therapist met with the patient 1:1 in person to offer support. Pt reported that he is "fine, waiting to be released". Pt shared his plans to go to his daughter's house in [place]. Pt reported that he is happy for the chance to spend time with his daughter and see her dog, which he has not seen since it was a puppy. Therapist asked pt if his romantic partner, which he had mentioned a couple of times last week, will also go to [place], and pt replied "I don't have a partner, it's just me."     Therapist asked pt how he was able to take care of himself today, and pt responded "I walked in the room some, but I miss exercising and jogging." Pt also stated that he has allergies and asked if the hospital has allergy medication. Therapist encouraged pt to ask the doctor about any medication. Therapist asked pt if he would like to learn any coping skills or do a meditation, and pt declined saying "I'm good, but thank you." Therapist will continue to reinforce pro-social behaviors, appropriate expression of feelings, and use of/efforts to use coping skills.       Kizzie Fantasia, MEd, LPC-R, NCC, Resident in Counseling

## 2020-10-19 NOTE — Progress Notes (Signed)
Checklist instructions: Score the patient once a shift and as needed. Absence of a behavior results in a score of 0. Presence or increase in the display of a behavior results in a score of 1. Maximum score (SUM) possible is 6. If a behavior is at baseline for a patient, e.g. the patient is normally confused, this will result in a score of 0. An increase in confusion will result in a score of 1.      Kristopher Rogers is being assessed under the Broset Violence Checklist for any escalations in potentially violent or harmful behavior.     Date of Admission: 10/02/20                                                 Date of Assessment: 10/19/20   Assessment Time 2000      Confused 0      Irritable 0      Boisterous 0      Verbal Threats 0      Physical Threats 0      Attacking Objects 0      SUM 0          TW attempted the following interventions based on the patient's sum total score assessed on the BVC: 1= Monitoring.    Should the patient require multiple interventions or fails to engage in de-escalation with staff, please review the plan for managing the care of an escalated patient.  Please do the following:  1.  Immediately notify Security and Nursing Administrative Supervisor/Director to respond  2.  Conduct team huddle to rule out clinical causes for escalated behavior  3.  Review the note authored by the Associate Professor for additional information  4.  Document specific behaviors observed  5.  Consider SAFE Team call for additional resources              The Broset Violence Checklist (BVC) is a 6 item inventory that was designed to assist in the prediction of imminent violent behavior (24 hours perspective) in healthcare and other sectors where workplace violence is acknowledged as a serious problem.   The BVC does not give instructions on what to do or what interventions to provide when a violent episode occurs; it is a took meant to aid in the risk assessment process. Interventions are based on the individual,  the environment, and cultural sensitivity indicators.

## 2020-10-19 NOTE — Plan of Care (Signed)
Problem: At Risk for Harm to Self: AS EVIDENCED BY...  Goal: Will identify long and short term goals based on individual needs and strengths  Outcome: Progressing  Goal: Reduction of self-harm thoughts and no attempts  Outcome: Progressing  Goal: Identifies stressors, protective factors, and coping strategies  Outcome: Progressing     Problem: Safety  Goal: Patient will be free from injury during hospitalization  Outcome: Progressing     Problem: Thought Disorder  Goal: Demonstrates orientation to person place and time  Outcome: Progressing   Kristopher Rogers is alert & oriented to person, place & time but disoriented to situation. He denied SI/HI/AVH, pain, anxiety, depression. He denied any chest pain, SOB, cough. He stated he is maintaining adequate nutrition & sleep. He is able to make his needs known. He presents with bizarre behavior, depressed mood. Pt presents intermittently confused & needs frequent redirection. Safety maintained. Staff will continue to monitor.

## 2020-10-19 NOTE — Plan of Care (Signed)
Problem: At Risk for Harm to Self: AS EVIDENCED BY...  Goal: Will identify long and short term goals based on individual needs and strengths  Outcome: Not Progressing  Goal: Verbalizes understanding of medication, benefits, and side effects  Outcome: Not Progressing  Goal: Identify and participate in supportive program therapies  Outcome: Not Progressing  Goal: Completes discharge safety and recovery plan  Outcome: Not Progressing     Problem: At Risk for Harm to Self: AS EVIDENCED BY...  Goal: Reduction of self-harm thoughts and no attempts  Outcome: Progressing  Goal: Identifies stressors, protective factors, and coping strategies  Outcome: Progressing     Problem: Safety  Goal: Patient will be free from injury during hospitalization  Outcome: Progressing  Goal: Patient will be free from infection during hospitalization  Outcome: Progressing     Problem: Thought Disorder  Goal: Demonstrates orientation to person place and time  Outcome: Progressing  Goal: Verbalizes reduction in hallucinations/delusions  Outcome: Progressing   Pt was pleasant, calm and cooperative upon approach. Denied SI/HI/AVH and pain. A&Ox2, d/o to situation and time. He stated his mood was good and his demeanor reflects this. He was compliant with medication and required no PRNs. He was compliant with isolation to his room, due to COVID. He was not observed responding to internal stimuli. Tremor was noted at a couple of different occasions. She maintained in isolation in her room and outside of medications she was cooperative.  Pt was able to make needs known. Pt was observed resting with eyes closed with unlabored breathing for 8 hours. Staff will continue to monitor for safety.

## 2020-10-19 NOTE — Progress Notes (Signed)
Checklist instructions: Score the patient once a shift and as needed. Absence of a behavior results in a score of 0. Presence or increase in the display of a behavior results in a score of 1. Maximum score (SUM) possible is 6. If a behavior is at baseline for a patient, e.g. the patient is normally confused, this will result in a score of 0. An increase in confusion will result in a score of 1.      Kristopher Rogers is being assessed under the Broset Violence Checklist for any escalations in potentially violent or harmful behavior.     Date of Admission: 10/02/20                                               Date of Assessment: 10/19/20   Assessment Time 0800      Confused 1      Irritable 0      Boisterous 0      Verbal Threats 0      Physical Threats 0      Attacking Objects 0      SUM 1          TW attempted the following interventions based on the patient's sum total score assessed on the BVC: 1= Monitoring.      Should the patient require multiple interventions or fails to engage in de-escalation with staff, please review the plan for managing the care of an escalated patient.  Please do the following:  1.  Immediately notify Security and Nursing Administrative Supervisor/Director to respond  2.  Conduct team huddle to rule out clinical causes for escalated behavior  3.  Review the note authored by the Associate Professor for additional information  4.  Document specific behaviors observed  5.  Consider SAFE Team call for additional resources              The Broset Violence Checklist (BVC) is a 6 item inventory that was designed to assist in the prediction of imminent violent behavior (24 hours perspective) in healthcare and other sectors where workplace violence is acknowledged as a serious problem.   The BVC does not give instructions on what to do or what interventions to provide when a violent episode occurs; it is a took meant to aid in the risk assessment process. Interventions are based on the individual,  the environment, and cultural sensitivity indicators.

## 2020-10-19 NOTE — Plan of Care (Signed)
Problem: At Risk for Harm to Self: AS EVIDENCED BY...  Goal: Reduction of self-harm thoughts and no attempts  Outcome: Progressing  SW spoke with pts daughter Fleet Contras over the phone. She confirmed that the pts family is willing to help him and want to move him back to [facility]. They would prefer he go to rehab first if possible. SW informed her that the pt would need to be in agreement with this. Per psychiatrist pt is not in agreement.     SW spoke with the pt over the intercom. SW spoke with the pt and would not agree but stated he will speak to [facility] first. SW provided [place] number. in his room. Pt was open to speaking with SW today. Pt is in agreement with moving to [place]. When addressing how he can get to [place] pt stated "I can have a friend drive me." When SW attempted to speak to him about rehab at [facility] as he has no insurance, pt stated he has [employer]. Pt would not hear about [facility] representative. Finance has check insurance and per previous CM pt has no insurance. SW called pts daughter to follow up and try to confirm. SW requested a return call.

## 2020-10-20 ENCOUNTER — Encounter (INDEPENDENT_AMBULATORY_CARE_PROVIDER_SITE_OTHER): Payer: Self-pay

## 2020-10-20 DIAGNOSIS — F19959 Other psychoactive substance use, unspecified with psychoactive substance-induced psychotic disorder, unspecified: Secondary | ICD-10-CM | POA: Diagnosis present

## 2020-10-20 MED ORDER — AMLODIPINE BESYLATE 10 MG PO TABS
10.0000 mg | ORAL_TABLET | Freq: Every day | ORAL | 0 refills | Status: AC
Start: 2020-10-21 — End: ?

## 2020-10-20 MED ORDER — PROPRANOLOL HCL 20 MG PO TABS
20.0000 mg | ORAL_TABLET | Freq: Three times a day (TID) | ORAL | 0 refills | Status: AC
Start: 2020-10-20 — End: ?

## 2020-10-20 MED ORDER — OLANZAPINE 20 MG PO TABS
20.0000 mg | ORAL_TABLET | Freq: Every evening | ORAL | 0 refills | Status: AC
Start: 2020-10-20 — End: ?

## 2020-10-20 NOTE — Progress Notes (Signed)
[facility] for [employer] (previously Psychologist, counselling):      Received referral to schedule a follow up visit with [facility] for [employer].  Telemedicine visits 48 hours after hospital discharge are being offered to patients that have a confirmed or pending COVID19 test result or who have upper respiratory infection symptoms.  Patient has been scheduled for a telemedicine appointment on 10/24/20  at 10:30AM  with Dani Gobble, MD.      In order to ensure proper follow up, please confirm that accurate contact information for patient is in the chart and inform patient that this will not be an in-person visit.    [facility] provider will assess patient during first telemedicine visit to determine frequency and method of ongoing care.     Please contact [phone] for any questions or concerns.    Azucena Freed  Patient Access II  [facility] [employer] [facility]  ([facility])  [address]  [place], [place] [address]    [phone] telephone  [phone] secured fax

## 2020-10-20 NOTE — Plan of Care (Signed)
Pt remains stable and has a discharge order.  Medicine service will sign off.

## 2020-10-20 NOTE — Plan of Care (Signed)
Problem: At Risk for Harm to Self: AS EVIDENCED BY...  Goal: Will identify long and short term goals based on individual needs and strengths  Outcome: Progressing  Goal: Reduction of self-harm thoughts and no attempts  Outcome: Progressing  Goal: Identifies stressors, protective factors, and coping strategies  Outcome: Progressing  Goal: Verbalizes understanding of medication, benefits, and side effects  Outcome: Progressing  Goal: Identify and participate in supportive program therapies  Outcome: Progressing  Goal: Completes discharge safety and recovery plan  Outcome: Progressing     Problem: Thought Disorder  Goal: Demonstrates orientation to person place and time  Outcome: Progressing  Goal: Verbalizes reduction in hallucinations/delusions  Outcome: Progressing     Kristopher Rogers is alert and oriented x 4. He currently denies SI/HI/AVH, and pain, but patient seems to be responding to internal stimuli. Patient was talking to himself on assessment this AM, he denied talking to self on inquiry. He is calm and cooperative with care and medications. He is able to express his needs. Delusion, auditory hallucination, poor insight. Ongoing Q 15 minutes monitoring. Kristopher Rogers will remain safe during this hospitalization. Will continue to monitor him for safety.

## 2020-10-20 NOTE — Plan of Care (Signed)
Problem: At Risk for Harm to Self: AS EVIDENCED BY...  Goal: Will identify long and short term goals based on individual needs and strengths  Outcome: Progressing  Goal: Reduction of self-harm thoughts and no attempts  Outcome: Progressing  Goal: Identifies stressors, protective factors, and coping strategies  Outcome: Progressing  Goal: Verbalizes understanding of medication, benefits, and side effects  Outcome: Progressing  Goal: Identify and participate in supportive program therapies  Outcome: Progressing  Goal: Completes discharge safety and recovery plan  Outcome: Progressing     Problem: Thought Disorder  Goal: Demonstrates orientation to person place and time  Outcome: Progressing  Goal: Verbalizes reduction in hallucinations/delusions  Outcome: Progressing     Kristopher Rogers has been present in his room this evening watching television. Pt A&O x3, disoriented to situation. Reports "good" mood. Denies SI/HI/AVH. Endorses good appetite - eating multiple snacks and hydrating well this evening. Pt reports okay sleep. Independent with ADLs. Pt inquiring about discharge date. Compliant with scheduled medications and given PRN Vistaril 25 mg for anxiety with good effect. Pt compliant with contact and airborne isolation as ordered. Denies cough, SOB, or pain. Safety rounds maintained.     Pt appeared to be asleep for a total of 7 hours overnight.

## 2020-10-20 NOTE — Progress Notes (Signed)
Checklist instructions: Score the patient once a shift and as needed. Absence of a behavior results in a score of 0. Presence or increase in the display of a behavior results in a score of 1. Maximum score (SUM) possible is 6. If a behavior is at baseline for a patient, e.g. the patient is normally confused, this will result in a score of 0. An increase in confusion will result in a score of 1.      Kristopher Rogers is being assessed under the Broset Violence Checklist for any escalations in potentially violent or harmful behavior.     Date of Admission: 10/02/20                                          Date of Assessment: 10/20/20   Assessment Time 0800      Confused 0      Irritable 0      Boisterous 0      Verbal Threats 0      Physical Threats 0      Attacking Objects 0      SUM 0          TW attempted the following interventions based on the patient's sum total score assessed on the BVC: 1= Monitoring.    Should the patient require multiple interventions or fails to engage in de-escalation with staff, please review the plan for managing the care of an escalated patient.  Please do the following:  1.  Immediately notify Security and Nursing Administrative Supervisor/Director to respond  2.  Conduct team huddle to rule out clinical causes for escalated behavior  3.  Review the note authored by the Associate Professor for additional information  4.  Document specific behaviors observed  5.  Consider SAFE Team call for additional resources              The Broset Violence Checklist (BVC) is a 6 item inventory that was designed to assist in the prediction of imminent violent behavior (24 hours perspective) in healthcare and other sectors where workplace violence is acknowledged as a serious problem.   The BVC does not give instructions on what to do or what interventions to provide when a violent episode occurs; it is a took meant to aid in the risk assessment process. Interventions are based on the individual, the  environment, and cultural sensitivity indicators.

## 2020-10-20 NOTE — Plan of Care (Addendum)
Problem: At Risk for Harm to Self: AS EVIDENCED BY...  Goal: Reduction of self-harm thoughts and no attempts  Outcome: Progressing   SW spoke with the pt over the intercom in his room. SW and pt discussed his [place] plan. Pt stated he will be going to his daughter in [place]. He declines to discuss with SW how he will get their, only stating my friend will take me. Pt has also declined SW assistance in getting to the hotel ([address] [place], [place], [place]) that he was staying at prior to admission. Per pts daughter, Fleet Contras, pts belongings and car are still their and the family has been in contact with the hotel to make sure of this. Pt states he is not sure of that but he will handle it. Pt reports that he will have a friend pick him up from the hospital and won't need assistance when leaving. Pt declined for SW to set up any mental health or rehab appointments. Pt was not sure when he would be leaving [place] and agreed to a TCM clinic appointment for medical follow. SW made referral. SW provided resources for [employer] and [facility], information on pts AVS.   SW spoke with pts daughter, Fleet Contras and she denies that pt has insurance, that she knows of and states her mother did not keep up with CO[employer]RA after the divorce. Pt is not insured.     Addendum: 2:10PM   SW reviewed plan with psychiatrist and re-reviewed with the pt. Pt will discharge today. Per pt his friend Annette Stable will pick him up and he plans to go back to the [facility] [place]. SW provided information to the [facility] in case the pt is not able to stay in the hotel. Pt declined assistance getting to the hotel several times.     Addendum 3:07 PM:  Pt will not give permission for discharge information to be sent out.

## 2020-10-20 NOTE — Discharge Summary -  Nursing (Signed)
Kristopher Rogers received discharge order.  Status upon discharge: Kristopher Rogers is alert and oriented x 4. He currently denies SI/HI/AVH, and pain, but patient seems to be responding to internal stimuli. Patient was talking to himself on assessment this AM, he denied talking to self on inquiry. He is calm and cooperative with care and medications. He is able to express his needs.     Safety plan was  completed and a copy given to patient. (if not, explain reason)    After Visit Summary, AVS, including prescribed medications, were reviewed with patient and patient was able to verbalize understanding of follow-up plan.  Patient was given a copy of their AVS and Adult Wellness, Recovery & Safety Plan.    Kristopher Rogers was discharged to [facility] by alone     Mode of transportation: [employer] belongings were returned.

## 2020-10-20 NOTE — Progress Notes (Signed)
Daily Note:    Affect/Mood:  Appropriate    Thought Process:  Logical    Thought Content:  Within Normal Limits    Interpersonal: Cooperative    Treatment Plan Goal Addressed:     Learn positive coping skills and express feelings regarding hospitalization.    Intervention Used and Patient Response:     No group was done in the unit due to COVID isolation. This Therapist 1:1 met with Pt today. Pt was cooperative and pleasant during the session. Pt reported that, I am feeling much better, will take shower today and wear the clean clothes. Pt asked questions about his discharge plan also shared his concerns on not knowing about his clothes and other belongings when he was brought to the hospital. Pt stated that, I want to know when and where I can get all my belongings. Therapist addressed patients concerns to his nurse. Pt also stated that, I am still dealing with some thoughts; however, he did not provide any details. Pt asked the therapist to provide some scientific magazines on dealing with the thoughts. Therapist provided information also coping skills worksheets about rumination, error thinking/ cognitive distortion, and how to replace negative thought to positive thoughts. Therapist will continue assessing and providing support as needed.
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
## 2020-10-24 ENCOUNTER — Telehealth (INDEPENDENT_AMBULATORY_CARE_PROVIDER_SITE_OTHER): Payer: Self-pay

## 2020-10-24 ENCOUNTER — Telehealth (INDEPENDENT_AMBULATORY_CARE_PROVIDER_SITE_OTHER): Payer: Self-pay | Admitting: Internal Medicine

## 2020-10-24 NOTE — Telephone Encounter (Signed)
[employer] for [employer] (previously Psychologist, counselling):     Telemed Check-In Call:     Writer unable to reach patient for check and voice mailbox was full.     Lavera Guise   Patient Access Associate  [facility] for [employer]  (Previously-[facility])  T: [phone]

## 2022-08-24 ENCOUNTER — Ambulatory Visit (INDEPENDENT_AMBULATORY_CARE_PROVIDER_SITE_OTHER): Payer: BLUE CROSS/BLUE SHIELD

## 2022-08-24 ENCOUNTER — Ambulatory Visit
Admission: EM | Admit: 2022-08-24 | Discharge: 2022-08-24 | Disposition: A | Discharge status: Home / Self Care | Payer: BLUE CROSS/BLUE SHIELD | Attending: Internal Medicine | Admitting: Internal Medicine

## 2022-08-24 DIAGNOSIS — Z1152 Encounter for screening for COVID-19: Secondary | ICD-10-CM | POA: Diagnosis not present

## 2022-08-24 DIAGNOSIS — J069 Acute upper respiratory infection, unspecified: Secondary | ICD-10-CM | POA: Diagnosis present

## 2022-08-24 DIAGNOSIS — M10061 Idiopathic gout, right knee: Secondary | ICD-10-CM | POA: Diagnosis present

## 2022-08-24 DIAGNOSIS — M25561 Pain in right knee: Secondary | ICD-10-CM

## 2022-08-24 LAB — RESP PANEL BY RT-PCR (FLU A&B, COVID) ARPGX2
Influenza A by PCR: NEGATIVE
Influenza B by PCR: NEGATIVE
SARS Coronavirus 2 by RT PCR: NEGATIVE

## 2022-08-24 LAB — URINALYSIS, MICROSCOPIC (REFLEX): RBC / HPF: NONE SEEN RBC/hpf (ref 0–5)

## 2022-08-24 LAB — URINALYSIS, ROUTINE W REFLEX MICROSCOPIC
Glucose, UA: NEGATIVE mg/dL
Hgb urine dipstick: NEGATIVE
Ketones, ur: 15 mg/dL — AB
Leukocytes,Ua: NEGATIVE
Nitrite: NEGATIVE
Protein, ur: 100 mg/dL — AB
Specific Gravity, Urine: 1.025 (ref 1.005–1.030)
pH: 5.5 (ref 5.0–8.0)

## 2022-08-24 LAB — CBC
HCT: 42.4 % (ref 39.0–52.0)
Hemoglobin: 14.6 g/dL (ref 13.0–17.0)
MCH: 33.6 pg (ref 26.0–34.0)
MCHC: 34.4 g/dL (ref 30.0–36.0)
MCV: 97.5 fL (ref 80.0–100.0)
Platelets: 208 10*3/uL (ref 150–400)
RBC: 4.35 MIL/uL (ref 4.22–5.81)
RDW: 11.6 % (ref 11.5–15.5)
WBC: 11.5 10*3/uL — ABNORMAL HIGH (ref 4.0–10.5)
nRBC: 0 % (ref 0.0–0.2)

## 2022-08-24 LAB — URIC ACID: Uric Acid, Serum: 8.3 mg/dL (ref 3.7–8.6)

## 2022-08-24 LAB — BASIC METABOLIC PANEL
Anion gap: 12 (ref 5–15)
BUN: 13 mg/dL (ref 8–23)
CO2: 19 mmol/L — ABNORMAL LOW (ref 22–32)
Calcium: 9.1 mg/dL (ref 8.9–10.3)
Chloride: 100 mmol/L (ref 98–111)
Creatinine, Ser: 1.28 mg/dL — ABNORMAL HIGH (ref 0.61–1.24)
GFR, Estimated: >60 mL/min (ref >60)
Glucose, Bld: 138 mg/dL — ABNORMAL HIGH (ref 70–99)
Potassium: 4.1 mmol/L (ref 3.5–5.1)
Sodium: 131 mmol/L — ABNORMAL LOW (ref 135–145)

## 2022-08-24 LAB — HEPATIC FUNCTION PANEL
ALT: 29 U/L (ref 0–44)
AST: 29 U/L (ref 15–41)
Albumin: 3.8 g/dL (ref 3.5–5.0)
Alkaline Phosphatase: 79 U/L (ref 38–126)
Bilirubin, Direct: 0.7 mg/dL — ABNORMAL HIGH (ref 0.0–0.2)
Indirect Bilirubin: 1.1 mg/dL — ABNORMAL HIGH (ref 0.3–0.9)
Total Bilirubin: 1.8 mg/dL — ABNORMAL HIGH (ref 0.3–1.2)
Total Protein: 8.2 g/dL — ABNORMAL HIGH (ref 6.5–8.1)

## 2022-08-24 MED ORDER — ACETAMINOPHEN 500 MG PO TABS
1000.0000 mg | ORAL_TABLET | Freq: Once | ORAL | Status: AC
  Administered 2022-08-24: 1000 mg via ORAL

## 2022-08-24 MED ORDER — PREDNISONE 10 MG PO TABS: ORAL_TABLET | ORAL | 0 refills | Status: AC

## 2022-08-24 MED ORDER — COLCHICINE 0.6 MG PO TABS: 0.6000 mg | ORAL_TABLET | Freq: Two times a day (BID) | ORAL | 0 refills | Status: AC | PRN

## 2022-08-24 MED ORDER — DEXAMETHASONE SODIUM PHOSPHATE 10 MG/ML IJ SOLN
10.0000 mg | Freq: Once | INTRAMUSCULAR | Status: AC
  Administered 2022-08-24: 10 mg via INTRAMUSCULAR

## 2022-08-24 NOTE — ED Triage Notes (Signed)
Pt c/o severe gout in the right knee x3days  Pt has been taking ice, heat, and ibuprofen for the pain and pt states that it does not help.  Pt states that he is out of his gout medication

## 2022-08-24 NOTE — Discharge Instructions (Signed)
You were seen today for gout of your right knee.  Your labs showed mildly elevated white count and decreased kidney function.  We want to avoid anti-inflammatories and encourage you to drink adequate amounts of water.  I have treated you with a steroid injection and prednisone for 9 days.  I was also given you prescription for colchicine to take as needed for gout flares thereafter.  He did have a fever and URI symptoms but you tested negative for COVID and flu.  We encourage rest and fluids.  Please go to the ER if your symptoms persist or worsen.

## 2022-08-24 NOTE — ED Provider Notes (Signed)
[facility]    CSN: [id number] Arrival date & time: 08/24/22  1707      History   Chief Complaint Chief Complaint  Patient presents with   Knee Pain    HPI Tommy Lindsey is a 61 y.o. male with a history of gout presents to UC today with complaint of right knee pain, swelling, warmth, decreased range of motion and inability to ambulate.  He describes the pain as throbbing.  The pain is worse with weightbearing.  He has tried Ibuprofen OTC with minimal relief of symptoms.  He reports he had a Rx for Indomethacin and Colchicine to take as needed in the past but has run out since he moved to [place] from [place].  He also reports headache, nasal congestion, sore throat and cough.  He reports this started yesterday.  The headache is located all over.  He describes the pain as pressure.  He is unable to blow any mucus out of his nose.  He denies difficulty swallowing. The cough is non productive. He denies runny nose, ear pain, shortness of breath, chest pain, nausea, vomiting or diarrhea. He was unaware that he was running a fever. He denies chills or body aches.  He has not had sick contacts that he is aware of.  He has not taken anything OTC for the symptoms.  He also reports increased thirst and dark urine.  He is unsure how long he has not been experiencing symptoms for.  He has no history of diabetes.  HPI  History reviewed. No pertinent past medical history.  There are no problems to display for this patient.   History reviewed. No pertinent surgical history.     Home Medications    Prior to Admission medications   Medication Sig Start Date End Date Taking? Authorizing Provider  colchicine 0.6 MG tablet Take 1 tablet (0.6 mg total) by mouth 2 (two) times daily as needed. 08/24/22  Yes Omir Cooprider, Salvadore Oxford, NP  predniSONE (DELTASONE) 10 MG tablet Take 3 tabs on days 1-3, 2 tabs on days 4-6, 1 tab on days 7-9 08/24/22  Yes Jazalynn Mireles, Salvadore Oxford, NP    Family  History History reviewed. No pertinent family history.  Social History Social History   Tobacco Use   Smoking status: Never   Smokeless tobacco: Current  Vaping Use   Vaping Use: Never used  Substance Use Topics   Alcohol use: Yes   Drug use: Never     Allergies   Patient has no allergy information on record.   Review of Systems Review of Systems   History reviewed. No pertinent past medical history.  No current facility-administered medications for this encounter.   Current Outpatient Medications  Medication Sig Dispense Refill   colchicine 0.6 MG tablet Take 1 tablet (0.6 mg total) by mouth 2 (two) times daily as needed. 30 tablet 0   predniSONE (DELTASONE) 10 MG tablet Take 3 tabs on days 1-3, 2 tabs on days 4-6, 1 tab on days 7-9 18 tablet 0    Not on File  History reviewed. No pertinent family history.  Social History   Socioeconomic History   Marital status: Married    Spouse name: Not on file   Number of children: Not on file   Years of education: Not on file   Highest education level: Not on file  Occupational History   Not on file  Tobacco Use   Smoking status: Never   Smokeless tobacco: Current  Vaping  Use   Vaping Use: Never used  Substance and Sexual Activity   Alcohol use: Yes   Drug use: Never   Sexual activity: Not on file  Other Topics Concern   Not on file  Social History Narrative   Not on file   Social Determinants of Health   Financial Resource Strain: Not on file  Food Insecurity: Not on file  Transportation Needs: Not on file  Physical Activity: Not on file  Stress: Not on file  Social Connections: Not on file  Intimate Partner Violence: Not on file     Constitutional: Patient reports fever, headache.  Denies  malaise, fatigue, or abrupt weight changes.  HEENT: Patient reports nasal congestion and sore throat.  Denies eye pain, eye redness, ear pain, ringing in the ears, wax buildup, runny nose, bloody  nose. Respiratory: Patient reports cough.  Denies difficulty breathing, shortness of breath, or sputum production.   Cardiovascular: Denies chest pain, chest tightness, palpitations or swelling in the hands or feet.  Gastrointestinal: Patient reports increased thirst.  Denies abdominal pain, bloating, constipation, diarrhea or blood in the stool.  GU: Patient reports dark urine.  Denies urgency, frequency, pain with urination, burning sensation, blood in urine, odor or discharge. Musculoskeletal: Patient reports right knee pain, swelling, decreased range of motion and difficulty with gait.  Denies muscle pain.  Skin: Denies redness, rashes, lesions or ulcercations.  Neurological: Denies dizziness, difficulty with memory, difficulty with speech..    No other specific complaints in a complete review of systems (except as listed in HPI above).  Physical Exam Triage Vital Signs ED Triage Vitals  Enc Vitals Group     BP 08/24/22 1722 (!) 143/90     Pulse Rate 08/24/22 1722 (!) 131     Resp 08/24/22 1722 18     Temp 08/24/22 1722 (!) 101.5 F (38.6 C)     Temp Source 08/24/22 1722 Oral     SpO2 08/24/22 1722 93 %     Weight 08/24/22 1720 200 lb (90.7 kg)     Height 08/24/22 1720 5\' 11"  (1.803 m)     Head Circumference --      Peak Flow --      Pain Score 08/24/22 1720 10     Pain Loc --      Pain Edu? --      Excl. in [place]? --    No data found.  Updated Vital Signs BP (!) 143/90 (BP Location: Right Arm)   Pulse (!) 121   Temp 98.2 F (36.8 C) (Oral)   Resp 18   Ht 5\' 11"  (1.803 m)   Wt 200 lb (90.7 kg)   SpO2 95%   BMI 27.89 kg/m    Physical Exam  BP (!) 143/90 (BP Location: Right Arm)   Pulse (!) 121   Temp 98.2 F (36.8 C) (Oral)   Resp 18   Ht 5\' 11"  (1.803 m)   Wt 200 lb (90.7 kg)   SpO2 95%   BMI 27.89 kg/m  Wt Readings from Last 3 Encounters:  08/24/22 200 lb (90.7 kg)    General: Appears his stated age, appears in well but in NAD. Skin: Warm, dry and  intact.  Mild redness and warmth noted of the right knee. HEENT: Head: normal shape and size; Eyes: sclera white, no icterus, conjunctiva pink, PERRLA and EOMs intact; Throat/Mouth: Teeth present, mucosa erythematous and moist, no exudate, lesions or ulcerations noted.  Neck: No adenopathy noted. Cardiovascular: Tachycardicwith normal  rhythm. S1,S2 noted.  No murmur, rubs or gallops noted.  Pulmonary/Chest: Normal effort and positive vesicular breath sounds. No respiratory distress. No wheezes, rales or ronchi noted.  Abdomen: Soft and nontender. Normal bowel sounds. No distention or masses noted.  Musculoskeletal: Decreased flexion and extension of the right knee.  Joint enlargement noted of the right knee.  Generalized pain with palpation of the right knee.  In wheelchair, having difficulty bearing weight on the right lower extremity. Neurological: Alert and oriented. Coordination normal.    UC Treatments / Results  Labs  Labs Reviewed  URINALYSIS, ROUTINE W REFLEX MICROSCOPIC - Abnormal; Notable for the following components:      Result Value   Color, Urine AMBER (*)    Bilirubin Urine LARGE (*)    Ketones, ur 15 (*)    Protein, ur 100 (*)    All other components within normal limits  BASIC METABOLIC PANEL - Abnormal; Notable for the following components:   Sodium 131 (*)    CO2 19 (*)    Glucose, Bld 138 (*)    Creatinine, Ser 1.28 (*)    All other components within normal limits  CBC - Abnormal; Notable for the following components:   WBC 11.5 (*)    All other components within normal limits  URINALYSIS, MICROSCOPIC (REFLEX) - Abnormal; Notable for the following components:   Bacteria, UA MANY (*)    All other components within normal limits  HEPATIC FUNCTION PANEL - Abnormal; Notable for the following components:   Total Protein 8.2 (*)    Total Bilirubin 1.8 (*)    Bilirubin, Direct 0.7 (*)    Indirect Bilirubin 1.1 (*)    All other components within normal limits  RESP  PANEL BY RT-PCR (FLU A&B, COVID) [id number]  URINE CULTURE  URIC ACID     Radiology  Imaging Orders         DG Knee 2 Views Right     IMPRESSION: 1. No acute bony findings or significant degenerative changes. 2. Moderate-sized suprapatellar knee joint effusion.    Medications Ordered in UC Medications  dexamethasone (DECADRON) injection 10 mg (10 mg Intramuscular Given 08/24/22 1825)  acetaminophen (TYLENOL) tablet 1,000 mg (1,000 mg Oral Given 08/24/22 1759)    Initial Impression / Assessment and Plan / UC Course  I have reviewed the triage vital signs and the nursing notes.  Pertinent labs & imaging results that were available during my care of the patient were reviewed by me and considered in my medical decision making (see chart for details).     61 year old male with history of gout presents to the UC today with complaint of right knee pain, swelling, decreased range of motion difficulty with gait as well as URI symptoms, increased thirst and dark urine.  X-ray right knee shows joint effusion without any erosive changes per my read, confirmed by radiology.  CBC shows evidence of leukocytosis with WBC count of 11.5.  BMet shows slightly decreased sodium, elevated creatinine of 1.28 with a GFR of 60.  We will add on a hepatic function panel which showed elevated bilirubin but normal liver enzymes.  Uric acid within the normal range.  COVID/flu negative.  Urinalysis with elevated bilirubin and protein.  Microscopic reflex shows many bacteria with mucus, cast and WBC present. Will add on urine culture.  Tylenol 1000 mg p.o. x1 for his fever with repeat temp of 98.2.  Decadron 10 mg IM x1 for knee pain and swelling.  Will give him  Rx for Pred taper x9 days for his gout flare and colchicine to take thereafter as needed for repeat gout flares.  Given his slightly decreased kidney function, advised him to avoid NSAIDs OTC and drink adequate amounts of water.  Encourage rest and fluids.  ER  precautions discussed with son.  Final Clinical Impressions(s) / UC Diagnoses   Final diagnoses:  Acute idiopathic gout of right knee  Viral URI with cough     Discharge Instructions      You were seen today for gout of your right knee.  Your labs showed mildly elevated white count and decreased kidney function.  We want to avoid anti-inflammatories and encourage you to drink adequate amounts of water.  I have treated you with a steroid injection and prednisone for 9 days.  I was also given you prescription for colchicine to take as needed for gout flares thereafter.  He did have a fever and URI symptoms but you tested negative for COVID and flu.  We encourage rest and fluids.  Please go to the ER if your symptoms persist or worsen.     ED Prescriptions     Medication Sig Dispense Auth. Provider   predniSONE (DELTASONE) 10 MG tablet Take 3 tabs on days 1-3, 2 tabs on days 4-6, 1 tab on days 7-9 18 tablet Prudy Candy, Coralie Keens, NP   colchicine 0.6 MG tablet Take 1 tablet (0.6 mg total) by mouth 2 (two) times daily as needed. 30 tablet Jearld Fenton, NP      PDMP not reviewed this encounter.   Jearld Fenton, NP 08/24/22 1925

## 2022-08-26 LAB — URINE CULTURE: Culture: 10000 — AB
